# Patient Record
Sex: Female | Born: 1965
Health system: Southern US, Community
[De-identification: ages and names within clinical notes are randomized; demographics above are authoritative.]

## PROBLEM LIST (undated history)

## (undated) DIAGNOSIS — D61818 Other pancytopenia: Secondary | ICD-10-CM

## (undated) DIAGNOSIS — R5383 Other fatigue: Secondary | ICD-10-CM

## (undated) DIAGNOSIS — Z923 Personal history of irradiation: Secondary | ICD-10-CM

## (undated) DIAGNOSIS — N1330 Unspecified hydronephrosis: Secondary | ICD-10-CM

## (undated) DIAGNOSIS — C539 Malignant neoplasm of cervix uteri, unspecified: Secondary | ICD-10-CM

## (undated) DIAGNOSIS — C801 Malignant (primary) neoplasm, unspecified: Secondary | ICD-10-CM

## (undated) DIAGNOSIS — S22080A Wedge compression fracture of T11-T12 vertebra, initial encounter for closed fracture: Secondary | ICD-10-CM

## (undated) DIAGNOSIS — Z9221 Personal history of antineoplastic chemotherapy: Secondary | ICD-10-CM

## (undated) HISTORY — PX: TUBAL LIGATION: SHX77

---
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
## 1987-08-06 HISTORY — PX: OTHER SURGICAL HISTORY: SHX169

## 2018-09-01 ENCOUNTER — Encounter: Payer: Self-pay | Admitting: Emergency Medicine

## 2018-09-01 ENCOUNTER — Emergency Department
Admission: EM | Admit: 2018-09-01 | Discharge: 2018-09-01 | Disposition: A | Discharge status: Home / Self Care | Payer: BLUE CROSS/BLUE SHIELD | Attending: Emergency Medicine | Admitting: Emergency Medicine

## 2018-09-01 ENCOUNTER — Emergency Department: Payer: BLUE CROSS/BLUE SHIELD

## 2018-09-01 DIAGNOSIS — K5900 Constipation, unspecified: Secondary | ICD-10-CM | POA: Insufficient documentation

## 2018-09-01 DIAGNOSIS — R1084 Generalized abdominal pain: Secondary | ICD-10-CM

## 2018-09-01 DIAGNOSIS — F17210 Nicotine dependence, cigarettes, uncomplicated: Secondary | ICD-10-CM | POA: Diagnosis not present

## 2018-09-01 DIAGNOSIS — R109 Unspecified abdominal pain: Secondary | ICD-10-CM | POA: Diagnosis present

## 2018-09-01 LAB — URINALYSIS, COMPLETE (UACMP) WITH MICROSCOPIC
Bacteria, UA: NONE SEEN
Bilirubin Urine: NEGATIVE
Glucose, UA: NEGATIVE mg/dL
KETONES UR: NEGATIVE mg/dL
LEUKOCYTES UA: NEGATIVE
Nitrite: NEGATIVE
Protein, ur: NEGATIVE mg/dL
Specific Gravity, Urine: 1.003 — ABNORMAL LOW (ref 1.005–1.030)
Squamous Epithelial / HPF: NONE SEEN (ref 0–5)
pH: 7 (ref 5.0–8.0)

## 2018-09-01 LAB — COMPREHENSIVE METABOLIC PANEL
ALK PHOS: 74 U/L (ref 38–126)
ALT: 13 U/L (ref 0–44)
AST: 20 U/L (ref 15–41)
Albumin: 4.4 g/dL (ref 3.5–5.0)
Anion gap: 8 (ref 5–15)
BUN: 10 mg/dL (ref 6–20)
CALCIUM: 9.2 mg/dL (ref 8.9–10.3)
CO2: 26 mmol/L (ref 22–32)
Chloride: 101 mmol/L (ref 98–111)
Creatinine, Ser: 0.75 mg/dL (ref 0.44–1.00)
GFR calc Af Amer: >60 mL/min (ref >60)
GFR calc non Af Amer: >60 mL/min (ref >60)
Glucose, Bld: 113 mg/dL — ABNORMAL HIGH (ref 70–99)
Potassium: 3.4 mmol/L — ABNORMAL LOW (ref 3.5–5.1)
Sodium: 135 mmol/L (ref 135–145)
Total Bilirubin: 0.5 mg/dL (ref 0.3–1.2)
Total Protein: 7.8 g/dL (ref 6.5–8.1)

## 2018-09-01 LAB — CBC
HEMATOCRIT: 44.2 % (ref 36.0–46.0)
Hemoglobin: 14.8 g/dL (ref 12.0–15.0)
MCH: 29.2 pg (ref 26.0–34.0)
MCHC: 33.5 g/dL (ref 30.0–36.0)
MCV: 87.2 fL (ref 80.0–100.0)
Platelets: 269 10*3/uL (ref 150–400)
RBC: 5.07 MIL/uL (ref 3.87–5.11)
RDW: 12.2 % (ref 11.5–15.5)
WBC: 6.5 10*3/uL (ref 4.0–10.5)
nRBC: 0 % (ref 0.0–0.2)

## 2018-09-01 LAB — LIPASE, BLOOD: Lipase: 20 U/L (ref 11–51)

## 2018-09-01 MED ORDER — DOCUSATE SODIUM 100 MG PO CAPS: 100.0000 mg | ORAL_CAPSULE | Freq: Two times a day (BID) | ORAL | 2 refills | Status: DC

## 2018-09-01 MED ORDER — CYCLOBENZAPRINE HCL 10 MG PO TABS: 10.0000 mg | ORAL_TABLET | Freq: Three times a day (TID) | ORAL | 0 refills | Status: DC | PRN

## 2018-09-01 MED ORDER — SENNA 8.6 MG PO TABS: 1.0000 | ORAL_TABLET | Freq: Every day | ORAL | 0 refills | Status: DC

## 2018-09-01 MED ORDER — SODIUM CHLORIDE 0.9% FLUSH: 3.0000 mL | Freq: Once | INTRAVENOUS | Status: DC

## 2018-09-01 NOTE — ED Provider Notes (Signed)
[facility] Emergency Department Provider Note       Time seen: ----------------------------------------- 1:55 PM on 09/01/2018 -----------------------------------------   I have reviewed the triage vital signs and the nursing notes.  HISTORY   Chief Complaint Abdominal Pain    HPI Jennifer Santos is a 53 y.o. female with no significant past medical history who presents to the ED for abdominal pain.  Patient is concerned she has a bowel blockage because for the past 6 months she occasionally has to disimpact herself.  She came in today due to increased pain.  She is also concerned she may have a UTI.  History reviewed. No pertinent past medical history.  There are no active problems to display for this patient.   Past Surgical History:  Procedure Laterality Date  . TUBAL LIGATION      Allergies Patient has no known allergies.  Social History Social History   Tobacco Use  . Smoking status: Current Every Day Smoker    Packs/day: 0.50    Types: Cigarettes  . Smokeless tobacco: Never Used  Substance Use Topics  . Alcohol use: Yes    Comment: Social   . Drug use: Yes    Types: Marijuana, Cocaine   Review of Systems Constitutional: Negative for fever. Cardiovascular: Negative for chest pain. Respiratory: Negative for shortness of breath. Gastrointestinal: Positive for abdominal pain, constipation Musculoskeletal: Negative for back pain. Skin: Negative for rash. Neurological: Negative for headaches, focal weakness or numbness.  All systems negative/normal/unremarkable except as stated in the HPI  ____________________________________________   PHYSICAL EXAM:  VITAL SIGNS: ED Triage Vitals [09/01/18 1147]  Enc Vitals Group     BP (!) 172/116     Pulse Rate 83     Resp 18     Temp 98.4 F (36.9 C)     Temp Source Oral     SpO2 97 %     Weight 120 lb (54.4 kg)     Height 5\' 6"  (1.676 m)     Head Circumference      Peak Flow    Pain Score      Pain Loc      Pain Edu?      Excl. in [place]?    Constitutional: Alert and oriented. Well appearing and in no distress. Eyes: Conjunctivae are normal. Normal extraocular movements. Cardiovascular: Normal rate, regular rhythm. No murmurs, rubs, or gallops. Respiratory: Normal respiratory effort without tachypnea nor retractions. Breath sounds are clear and equal bilaterally. No wheezes/rales/rhonchi. Gastrointestinal: Soft and nontender. Normal bowel sounds Musculoskeletal: Nontender with normal range of motion in extremities. No lower extremity tenderness nor edema. Neurologic:  Normal speech and language. No gross focal neurologic deficits are appreciated.  Skin:  Skin is warm, dry and intact. No rash noted. Psychiatric: Mood and affect are normal. Speech and behavior are normal.  ____________________________________________  ED COURSE:  As part of my medical decision making, I reviewed the following data within the [place] History obtained from family if available, nursing notes, old chart and ekg, as well as notes from prior ED visits. Patient presented for abd pain, we will assess with labs and imaging as indicated at this time.   Procedures ____________________________________________   LABS (pertinent positives/negatives)  Labs Reviewed  COMPREHENSIVE METABOLIC PANEL - Abnormal; Notable for the following components:      Result Value   Potassium 3.4 (*)    Glucose, Bld 113 (*)    All other components within normal limits  URINALYSIS, COMPLETE (UACMP) WITH MICROSCOPIC - Abnormal; Notable for the following components:   Color, Urine STRAW (*)    APPearance CLEAR (*)    Specific Gravity, Urine 1.003 (*)    Hgb urine dipstick MODERATE (*)    All other components within normal limits  LIPASE, BLOOD  CBC    RADIOLOGY  Abdomen 2 views unremarkable  ____________________________________________   DIFFERENTIAL DIAGNOSIS   Constipation, gas  pain, chronic pain, fictitious disorder  FINAL ASSESSMENT AND PLAN  Constipation   Plan: The patient had presented for concerns regarding a bowel obstruction.  Her labs and imaging were unremarkable.  I will discharge her with Colace and Senokot and refer her to GI for outpatient follow-up.   Laurence Aly, MD    Note: This note was generated in part or whole with voice recognition software. Voice recognition is usually quite accurate but there are transcription errors that can and very often do occur. I apologize for any typographical errors that were not detected and corrected.     Earleen Newport, MD 09/01/18 1447

## 2018-09-01 NOTE — ED Triage Notes (Signed)
Patient presents to ED via POV from home. Patient states "I have a bowel blockage and a lot of pain". Patient denies being dx with blockage or history of same. Patient states "whatever needs to come out I have to go get it and pull it out manually". Patient reports this has been an ongoing issue for 6 months but came in today due to increased pain.

## 2018-09-01 NOTE — ED Triage Notes (Signed)
Short of breath and thinks she may have uti.  amulated in with walker.  Placed in wheelchair.  Says hx of chf.

## 2018-10-20 ENCOUNTER — Ambulatory Visit
Admission: RE | Admit: 2018-10-20 | Discharge: 2018-10-20 | Disposition: A | Discharge status: Home / Self Care | Payer: BLUE CROSS/BLUE SHIELD | Source: Ambulatory Visit | Attending: Family Medicine | Admitting: Family Medicine

## 2018-10-20 ENCOUNTER — Other Ambulatory Visit: Payer: Self-pay | Admitting: Family Medicine

## 2018-10-20 DIAGNOSIS — M545 Low back pain, unspecified: Secondary | ICD-10-CM

## 2018-10-28 ENCOUNTER — Telehealth: Payer: Self-pay | Admitting: Obstetrics & Gynecology

## 2018-10-28 NOTE — Telephone Encounter (Signed)
[employer] referring for other specified abnormal uterine and vaginal bleeding . Voicemail box is full unable to let message

## 2018-10-29 NOTE — Telephone Encounter (Signed)
Patient is schedule 11/04/18 pending review of provider

## 2018-11-04 ENCOUNTER — Other Ambulatory Visit: Payer: Self-pay

## 2018-11-04 ENCOUNTER — Encounter: Payer: Self-pay | Admitting: Obstetrics & Gynecology

## 2018-11-04 ENCOUNTER — Other Ambulatory Visit (HOSPITAL_COMMUNITY)
Admission: RE | Admit: 2018-11-04 | Discharge: 2018-11-04 | Disposition: A | Discharge status: Home / Self Care | Payer: BLUE CROSS/BLUE SHIELD | Source: Ambulatory Visit | Attending: Obstetrics & Gynecology | Admitting: Obstetrics & Gynecology

## 2018-11-04 ENCOUNTER — Ambulatory Visit (INDEPENDENT_AMBULATORY_CARE_PROVIDER_SITE_OTHER): Payer: BLUE CROSS/BLUE SHIELD | Admitting: Obstetrics & Gynecology

## 2018-11-04 VITALS — BP 130/80 | Ht 66.0 in | Wt 121.0 lb

## 2018-11-04 DIAGNOSIS — C541 Malignant neoplasm of endometrium: Secondary | ICD-10-CM

## 2018-11-04 DIAGNOSIS — N950 Postmenopausal bleeding: Secondary | ICD-10-CM | POA: Insufficient documentation

## 2018-11-04 DIAGNOSIS — Z124 Encounter for screening for malignant neoplasm of cervix: Secondary | ICD-10-CM | POA: Insufficient documentation

## 2018-11-04 NOTE — Progress Notes (Signed)
Postmenopausal Bleeding Patient complains of vaginal bleeding. She has been menopausal for 9 years. Currently on no HRT. Bleeding is described as occuring over the last one year, weekly at first but now almost daily for the last 2 months; clots at times recently; no pain.  Noticed a 10-12 lb weight loss over the last 6 mos.  As worsening constipation as well as less feeling of bowel movements when she defacates.  Uses Miralax frequently. Other menopausal symptoms include: none. Workup to date: none.  Menstrual History: OB History    Gravida  3   Para  2   Term  2   Preterm      AB  1   Living  2     SAB  1   TAB      Ectopic      Multiple      Live Births              PMHx: She  has no past medical history on file. Also,  has a past surgical history that includes Tubal ligation., family history includes Brain cancer in her father; Cancer in her father; Lung cancer in her father.,  reports that she has been smoking cigarettes. She has been smoking about 0.50 packs per day. She has never used smokeless tobacco. She reports current alcohol use. She reports current drug use. Drugs: Marijuana and Cocaine.  She has a current medication list which includes the following prescription(s): methylprednisolone, tizanidine, cyclobenzaprine, docusate sodium, and senna. Also, has No Known Allergies.  Review of Systems  Constitutional: Negative for chills, fever and malaise/fatigue.  HENT: Negative for congestion, sinus pain and sore throat.   Eyes: Negative for blurred vision and pain.  Respiratory: Negative for cough and wheezing.   Cardiovascular: Negative for chest pain and leg swelling.  Gastrointestinal: Negative for abdominal pain, constipation, diarrhea, heartburn, nausea and vomiting.  Genitourinary: Negative for dysuria, frequency, hematuria and urgency.  Musculoskeletal: Negative for back pain, joint pain, myalgias and neck pain.  Skin: Negative for itching and rash.   Neurological: Negative for dizziness, tremors and weakness.  Endo/Heme/Allergies: Does not bruise/bleed easily.  Psychiatric/Behavioral: Negative for depression. The patient is not nervous/anxious and does not have insomnia.     Objective: BP 130/80   Ht 5\' 6"  (1.676 m)   Wt 121 lb (54.9 kg)   BMI 19.53 kg/m  Physical Exam Constitutional:      General: She is not in acute distress.    Appearance: She is well-developed.  Genitourinary:     Pelvic exam was performed with patient supine.     Vulva, inguinal canal, urethra, bladder, vagina, uterus and rectum normal.     No lesions in the vagina.     No vaginal bleeding.     Cervical friability and bleeding present.     No cervical motion tenderness, lesion or polyp.     Uterus is mobile.     Uterus is not enlarged.     No uterine mass detected.    Uterus is midaxial.     No right or left adnexal mass present.     Right adnexa not tender.     Left adnexa not tender.     Genitourinary Comments: Concerning appearance of cervix   HENT:     Head: Normocephalic and atraumatic. No laceration.     Right Ear: Hearing normal.     Left Ear: Hearing normal.     Mouth/Throat:     Pharynx: Uvula  midline.  Eyes:     Pupils: Pupils are equal, round, and reactive to light.  Neck:     Musculoskeletal: Normal range of motion and neck supple.     Thyroid: No thyromegaly.  Cardiovascular:     Rate and Rhythm: Normal rate and regular rhythm.     Heart sounds: No murmur. No friction rub. No gallop.   Pulmonary:     Effort: Pulmonary effort is normal. No respiratory distress.     Breath sounds: Normal breath sounds. No wheezing.  Abdominal:     General: Bowel sounds are normal. There is no distension.     Palpations: Abdomen is soft.     Tenderness: There is no abdominal tenderness. There is no rebound.  Musculoskeletal: Normal range of motion.  Neurological:     Mental Status: She is alert and oriented to person, place, and time.      Cranial Nerves: No cranial nerve deficit.  Skin:    General: Skin is warm and dry.  Psychiatric:        Judgment: Judgment normal.  Vitals signs reviewed.   ASSESSMENT/PLAN:     Post-menopausal bleeding    -  Primary   Relevant Orders   Surgical pathology   Cytology - PAP   Screening for cervical cancer       Relevant Orders   Cytology - PAP    Concern for cervical cancer Gyn Onc referral after results if pos for cancer Colpo and [place] if neg initial Bx/PAP Counseled as to the risks and treatment options for cervical and/or endometrial cancer   Endometrial Biopsy After discussion with the patient regarding her abnormal uterine bleeding I recommended that she proceed with an endometrial biopsy for further diagnosis. The risks, benefits, alternatives, and indications for an endometrial biopsy were discussed with the patient in detail. She understood the risks including infection, bleeding, cervical laceration and uterine perforation.  Verbal consent was obtained.   PROCEDURE NOTE:  Pipelle endometrial biopsy was performed using aseptic technique with iodine preparation.  The uterus was sounded to a length of 6 cm.  Adequate sampling was obtained with minimal blood loss.  The patient tolerated the procedure well.  Disposition will be pending pathology.  Barnett Applebaum, MD, [facility], [facility] 11/04/2018  4:29 PM

## 2018-11-04 NOTE — Patient Instructions (Signed)

## 2018-11-09 ENCOUNTER — Other Ambulatory Visit: Payer: Self-pay | Admitting: Obstetrics & Gynecology

## 2018-11-09 DIAGNOSIS — N950 Postmenopausal bleeding: Secondary | ICD-10-CM

## 2018-11-09 DIAGNOSIS — C539 Malignant neoplasm of cervix uteri, unspecified: Secondary | ICD-10-CM

## 2018-11-09 LAB — CYTOLOGY - PAP

## 2018-11-09 NOTE — Progress Notes (Signed)
Please schedule appt w Gyn Onc, due to Dx of Cervical Cancer by PAP and Biopsy.  Pt is aware, and awaiting scheduling.

## 2018-11-10 ENCOUNTER — Telehealth: Payer: Self-pay | Admitting: Oncology

## 2018-11-10 ENCOUNTER — Telehealth: Payer: Self-pay

## 2018-11-10 NOTE — Telephone Encounter (Signed)
Referral was received and reviewed from Dr. Kenton Kingfisher for new diagnosis of cervical cancer. Due to COVID-19 pandemic, we do not have gyn onc providers in person, in clinic. Options are to arrange with our providers at [facility] or at [facility]. Jennifer Santos prefers to be seen in [place]. I am working with gyn onc navigator, Jennifer Santos, to arrange appointment with Dr. Alycia Santos for tomorrow. Jennifer Santos is aware that we will be calling her with appointment details.

## 2018-11-10 NOTE — Telephone Encounter (Signed)
Called patient with appointment to see Dr. Alycia Rossetti at 8:30 with arrival at 8 am.  Gave her the address to the [place] and discuss the no visitor policy for [id number].

## 2018-11-11 ENCOUNTER — Encounter: Payer: Self-pay | Admitting: Oncology

## 2018-11-11 ENCOUNTER — Inpatient Hospital Stay: Payer: BLUE CROSS/BLUE SHIELD | Attending: Gynecologic Oncology | Admitting: Gynecologic Oncology

## 2018-11-11 ENCOUNTER — Other Ambulatory Visit: Payer: Self-pay | Admitting: Oncology

## 2018-11-11 ENCOUNTER — Other Ambulatory Visit: Payer: Self-pay

## 2018-11-11 ENCOUNTER — Telehealth: Payer: Self-pay | Admitting: Oncology

## 2018-11-11 DIAGNOSIS — C531 Malignant neoplasm of exocervix: Secondary | ICD-10-CM

## 2018-11-11 DIAGNOSIS — F1290 Cannabis use, unspecified, uncomplicated: Secondary | ICD-10-CM | POA: Insufficient documentation

## 2018-11-11 DIAGNOSIS — F17210 Nicotine dependence, cigarettes, uncomplicated: Secondary | ICD-10-CM | POA: Diagnosis not present

## 2018-11-11 DIAGNOSIS — F1490 Cocaine use, unspecified, uncomplicated: Secondary | ICD-10-CM | POA: Insufficient documentation

## 2018-11-11 DIAGNOSIS — R222 Localized swelling, mass and lump, trunk: Secondary | ICD-10-CM | POA: Diagnosis not present

## 2018-11-11 DIAGNOSIS — I10 Essential (primary) hypertension: Secondary | ICD-10-CM | POA: Diagnosis not present

## 2018-11-11 DIAGNOSIS — C539 Malignant neoplasm of cervix uteri, unspecified: Secondary | ICD-10-CM | POA: Insufficient documentation

## 2018-11-11 DIAGNOSIS — R630 Anorexia: Secondary | ICD-10-CM | POA: Diagnosis not present

## 2018-11-11 NOTE — Patient Instructions (Addendum)
Plan to have a PET scan to evaluate for spread of the cancer.  We will also make a referral for you to meet with Dr. Gery Pray in Radiation to discuss and start radiation and Dr. Heath Lark in Medical Oncology to discuss and initiate chemotherapy.

## 2018-11-11 NOTE — Progress Notes (Signed)
Consult Note: Gyn-Onc Patient is seen today in consultation at the request of Dr. Teresa Coombs.  Jennifer Santos 53 y.o. female  CC:  Chief Complaint  Patient presents with  . Malignant neoplasm of exocervix (HCC)    HPI: Patient is a 53 year old gravida 3 para 2 who for the last 9 months to year has been having postmenopausal bleeding.  Initially it started just as light bleeding every week and then increased fairly significantly.  About 8 weeks ago she passed some very heavy clots.  She is not sexually active.  It was because of the increase in bleeding that she sought medical attention.  She saw Dr. Kenton Kingfisher on April 1 and a Pap smear was performed that revealed malignant cells.  A biopsy was performed that also revealed invasive moderately differentiated squamous cell carcinoma.  And she was referred to [place] today.  She states her last Pap smear was in 2010.  At that time her Pap smear was abnormal.  She does not know how abnormal but she never followed up with her providers.  Other than the bleeding she does have back pain and she also is needing to take a laxative as she feels that there is "something in the way" when she has to have a bowel movement.  That is been going on for about 6 months.  She is able to empty her bladder without any difficulty.  She does have some lower abdominal cramping.  She does have some low back pain.  She has felt a knot in her back for about a year.  She states she recently saw neurosurgery as she has a fractured vertebra.  She states she had no pain along her vertebral body.  That was not done within our current medical record system. She does use cocaine about once a week.  Uses marijuana daily as it helps her feel better and it helps with her back pain.  She drinks alcohol about 2 times a week.  She smokes every day.  She is been smoking for 9 years and smokes a pack per day.  Review of Systems: Constitutional: She has lost about 10 to 12 pounds in the last 3  months.  This is been unintentional.  She does have diminished appetite Skin: + Lumps on her back Cardiovascular: No chest pain, shortness of breath, or edema  Pulmonary: No cough  Gastro Intestinal: Reporting intermittent lower abdominal soreness.  No nausea, vomiting, + constipation, or diarrhea reported.   Genitourinary: No frequency, urgency, or dysuria.  + vaginal bleeding  Musculoskeletal:+ Low back pain Neurologic: No weakness, numbness, or change in gait.   Current Meds:  Outpatient Encounter Medications as of 11/11/2018  Medication Sig  . ibuprofen (ADVIL,MOTRIN) 200 MG tablet Take by mouth.  Marland Kitchen tiZANidine (ZANAFLEX) 2 MG tablet   . [DISCONTINUED] cyclobenzaprine (FLEXERIL) 10 MG tablet Take 1 tablet (10 mg total) by mouth 3 (three) times daily as needed for muscle spasms. (Patient not taking: Reported on 11/04/2018)  . [DISCONTINUED] docusate sodium (COLACE) 100 MG capsule Take 1 capsule (100 mg total) by mouth 2 (two) times daily. (Patient not taking: Reported on 11/04/2018)  . [DISCONTINUED] methylPREDNISolone (MEDROL DOSEPAK) 4 MG TBPK tablet   . [DISCONTINUED] senna (SENOKOT) 8.6 MG TABS tablet Take 1 tablet (8.6 mg total) by mouth at bedtime. (Patient not taking: Reported on 11/04/2018)   No facility-administered encounter medications on file as of 11/11/2018.     Allergy: No Known Allergies  Social Hx:   Social  History   Socioeconomic History  . Marital status: Legally Separated    Spouse name: Not on file  . Number of children: Not on file  . Years of education: Not on file  . Highest education level: Not on file  Occupational History  . Not on file  Social Needs  . Financial resource strain: Not on file  . Food insecurity:    Worry: Not on file    Inability: Not on file  . Transportation needs:    Medical: Not on file    Non-medical: Not on file  Tobacco Use  . Smoking status: Current Every Day Smoker    Packs/day: 0.50    Types: Cigarettes  . Smokeless tobacco:  Never Used  Substance and Sexual Activity  . Alcohol use: Yes    Comment: Social   . Drug use: Yes    Types: Marijuana, Cocaine  . Sexual activity: Not Currently  Lifestyle  . Physical activity:    Days per week: Not on file    Minutes per session: Not on file  . Stress: Not on file  Relationships  . Social connections:    Talks on phone: Not on file    Gets together: Not on file    Attends religious service: Not on file    Active member of club or organization: Not on file    Attends meetings of clubs or organizations: Not on file    Relationship status: Not on file  . Intimate partner violence:    Fear of current or ex partner: Not on file    Emotionally abused: Not on file    Physically abused: Not on file    Forced sexual activity: Not on file  Other Topics Concern  . Not on file  Social History Narrative  . Not on file    Past Surgical Hx:  Past Surgical History:  Procedure Laterality Date  . BACK SURGERY  1985   Removal of superficial mass  . TUBAL LIGATION      Past Medical Hx: History reviewed. No pertinent past medical history.  Oncology Hx:    Cervical ca ([place])   11/09/2018 Initial Diagnosis    Cervical ca [facility])     Family Hx:  Family History  Problem Relation Age of Onset  . Cancer Father   . Brain cancer Father   . Lung cancer Father     Vitals:  Blood pressure (!) 153/92, pulse 80, temperature 98.6 F (37 C), temperature source Oral, resp. rate 16, height 5\' 6"  (1.676 m), weight 123 lb (55.8 kg), SpO2 100 %.  Physical Exam: Well-nourished well-developed thin female in no acute distress.  Neck: Supple, no lymphadenopathy, no thyromegaly.  Abdomen: Soft, nontender, nondistended.  No palpable mass hepatosplenomegaly.  Groins: Shotty lymphadenopathy in the right groin.  Back: Nontender.  Well-healed surgical incision over the lower mid back.  Lipoma palpable on her right side  Extremities: No edema  Vulva: External genitalia within  normal limits.  The vagina is well but the lysed.  There is a large crater-like mass at the top of the vaginal apex.  There is no normal cervix identifiable.  On bimanual examination there is sidewall involvement of an 8 cm mass on the left side.  The right sidewall is free but there is parametrial involvement on the right side but it does not extend to the sidewall.  Rectal confirms  Assessment/Plan: 53 year old with a stage IIIb clinical squamous cell carcinoma of the cervix.  I  discussed with the patient that this is a treatable disease but we have several steps we need to proceed with.  -We will get her scheduled for a PET/CT.  She knows that I will contact her with results. -Referrals have been placed for both medical oncology for weekly cisplatin as well as radiation oncology.  The process was discussed with the patient. -I discussed with the patient that marijuana is not a issue but we would like her to stop using cocaine and the rationale behind this with regards to hypertension as well as vascular constriction.  She states that that makes sense and she will work towards that.  She states that should not be a problem for her.  Her questions were elicited and answered to her satisfaction.  We appreciate the opportunity to partner in the care of this very pleasant patient.  I do understand that this is an overwhelming situation.  She did not have very many questions for [place] today but encouraged her to call us when she has the opportunity to process this formation.  Montrell Cessna A., MD 11/11/2018, 9:37 AM

## 2018-11-11 NOTE — Telephone Encounter (Signed)
Left a message for Jennifer Santos with appointments to see Dr. Alvy Bimler on 11/18/18 at 10 am and chemo education at 12 pm.

## 2018-11-11 NOTE — Addendum Note (Signed)
Addended by: Jacqulyn Liner on: 11/11/2018 09:27 AM   Modules accepted: Orders

## 2018-11-12 NOTE — Progress Notes (Signed)
GYN Location of Tumor / Histology:   Malignant neoplasm of exocervix ([place])     Jennifer Santos presented with symptoms of: for the last 9 months to year has been having postmenopausal bleeding.  Initially it started just as light bleeding every week and then increased fairly significantly.  About 8 weeks ago she passed some very heavy clots.  She is not sexually active.  It was because of the increase in bleeding that she sought medical attention.  She saw Dr. Kenton Kingfisher on April 1 and a Pap smear was performed that revealed malignant cells.  A biopsy was performed that also revealed invasive moderately differentiated squamous cell carcinoma.  And she was referred to [place] today.  She states her last Pap smear was in 2010.  At that time her Pap smear was abnormal.  She does not know how abnormal but she never followed up with her providers.  Other than the bleeding she does have back pain and she also is needing to take a laxative as she feels that there is "something in the way" when she has to have a bowel movement.  That is been going on for about 6 months.  She is able to empty her bladder without any difficulty.  She does have some lower abdominal cramping.  She does have some low back pain.  She has felt a knot in her back for about a year.  Biopsies revealed: 11/04/18:  Diagnosis Endometrium, biopsy - INVASIVE MODERATELY DIFFERENTIATED SQUAMOUS CELL CARCINOMA. SEE NOTE Diagnosis Note Squamous cell carcinoma arises in a background of high-grade dysplasia with possible glandular involvement.  Cytology - PAP 11/04/18:  Diagnosis MALIGNANT CELLS, CONSISTENT WITH CARCINOMA  Past/Anticipated interventions by Gyn/Onc surgery, if any: Per Dr. Alycia Rossetti 11/11/18:  Assessment/Plan: 53 year old with a stage IIIb clinical squamous cell carcinoma of the cervix.  I discussed with the patient that this is a treatable disease but we have several steps we need to proceed with.  -We will get her scheduled for a PET/CT.  She  knows that I will contact her with results. -Referrals have been placed for both medical oncology for weekly cisplatin as well as radiation oncology.  The process was discussed with the patient. -I discussed with the patient that marijuana is not a issue but we would like her to stop using cocaine and the rationale behind this with regards to hypertension as well as vascular constriction.  She states that that makes sense and she will work towards that.  She states that should not be a problem for her.   PET Scan 11/17/18:  Rescheduled to 11/20/18  Past/Anticipated interventions by medical oncology, if any: Initial consult with Dr. Alvy Bimler 11/18/18 1000  Weight changes, if any: pt has lost approximately 12 lbs in 3 months  Bowel/Bladder complaints, if any: no bladder c/o. Pt has constipation and has to take laxative to produce BM. At times, needs digital removal. Dysuria/hematuria/frequency: denies Vaginal bleeding/discharge: pt reports vaginal bleeding. Rectal bleeding/diarrhea/constipation: rectal bleeding with digital removal of stool.  Nausea/Vomiting, if any: No  Pain issues, if any:  Pt c/o daily pain, described as cramping in pelvic area and lower back.  SAFETY ISSUES:  Prior radiation? No  Pacemaker/ICD? No  Possible current pregnancy? No  Is the patient on methotrexate? No  Current Complaints / other details:  Pt presents today for initial consult with Dr. Sondra Come for Radiation Oncology. Pt is unaccompanied.   BP (!) 142/95 (BP Location: Left Arm)   Pulse 80   Temp  98.3 F (36.8 C) (Oral)   Resp 20   Ht 5\' 6"  (1.676 m)   Wt 125 lb 6.4 oz (56.9 kg)   SpO2 100%   BMI 20.24 kg/m   Wt Readings from Last 3 Encounters:  11/18/18 125 lb 6.4 oz (56.9 kg)  11/11/18 123 lb (55.8 kg)  11/04/18 121 lb (54.9 kg)   Loma Sousa, RN BSN

## 2018-11-17 ENCOUNTER — Other Ambulatory Visit: Payer: Self-pay

## 2018-11-17 ENCOUNTER — Ambulatory Visit (HOSPITAL_COMMUNITY)
Admission: RE | Admit: 2018-11-17 | Discharge: 2018-11-17 | Disposition: A | Discharge status: Home / Self Care | Payer: BLUE CROSS/BLUE SHIELD | Source: Ambulatory Visit | Attending: Gynecologic Oncology | Admitting: Gynecologic Oncology

## 2018-11-17 ENCOUNTER — Ambulatory Visit (HOSPITAL_COMMUNITY): Payer: BLUE CROSS/BLUE SHIELD

## 2018-11-17 ENCOUNTER — Encounter (HOSPITAL_COMMUNITY): Payer: Self-pay

## 2018-11-17 DIAGNOSIS — C531 Malignant neoplasm of exocervix: Secondary | ICD-10-CM

## 2018-11-17 HISTORY — DX: Malignant (primary) neoplasm, unspecified: C80.1

## 2018-11-17 MED ORDER — SODIUM CHLORIDE (PF) 0.9 % IJ SOLN
INTRAMUSCULAR | Status: AC
  Filled 2018-11-17: qty 50

## 2018-11-17 MED ORDER — IOHEXOL 300 MG/ML  SOLN
100.0000 mL | Freq: Once | INTRAMUSCULAR | Status: AC | PRN
  Administered 2018-11-17: 75 mL via INTRAVENOUS

## 2018-11-18 ENCOUNTER — Ambulatory Visit
Admission: RE | Admit: 2018-11-18 | Discharge: 2018-11-18 | Disposition: A | Discharge status: Home / Self Care | Payer: BLUE CROSS/BLUE SHIELD | Source: Ambulatory Visit | Attending: Radiation Oncology | Admitting: Radiation Oncology

## 2018-11-18 ENCOUNTER — Encounter: Payer: Self-pay | Admitting: Radiation Oncology

## 2018-11-18 ENCOUNTER — Inpatient Hospital Stay (HOSPITAL_BASED_OUTPATIENT_CLINIC_OR_DEPARTMENT_OTHER): Payer: BLUE CROSS/BLUE SHIELD | Admitting: Hematology and Oncology

## 2018-11-18 ENCOUNTER — Inpatient Hospital Stay: Payer: BLUE CROSS/BLUE SHIELD

## 2018-11-18 ENCOUNTER — Other Ambulatory Visit: Payer: Self-pay

## 2018-11-18 ENCOUNTER — Encounter: Payer: Self-pay | Admitting: Oncology

## 2018-11-18 ENCOUNTER — Encounter: Payer: Self-pay | Admitting: Hematology and Oncology

## 2018-11-18 VITALS — BP 136/93 | HR 66 | Temp 98.3°F | Resp 18 | Ht 66.0 in | Wt 128.4 lb

## 2018-11-18 VITALS — BP 142/95 | HR 80 | Temp 98.3°F | Resp 20 | Ht 66.0 in | Wt 125.4 lb

## 2018-11-18 DIAGNOSIS — C531 Malignant neoplasm of exocervix: Secondary | ICD-10-CM

## 2018-11-18 DIAGNOSIS — G8929 Other chronic pain: Secondary | ICD-10-CM | POA: Diagnosis not present

## 2018-11-18 DIAGNOSIS — N1330 Unspecified hydronephrosis: Secondary | ICD-10-CM | POA: Diagnosis not present

## 2018-11-18 DIAGNOSIS — Z923 Personal history of irradiation: Secondary | ICD-10-CM | POA: Diagnosis not present

## 2018-11-18 DIAGNOSIS — M549 Dorsalgia, unspecified: Secondary | ICD-10-CM | POA: Diagnosis not present

## 2018-11-18 DIAGNOSIS — F17210 Nicotine dependence, cigarettes, uncomplicated: Secondary | ICD-10-CM

## 2018-11-18 DIAGNOSIS — K5909 Other constipation: Secondary | ICD-10-CM

## 2018-11-18 DIAGNOSIS — Z7189 Other specified counseling: Secondary | ICD-10-CM

## 2018-11-18 DIAGNOSIS — Z720 Tobacco use: Secondary | ICD-10-CM

## 2018-11-18 LAB — CMP (CANCER CENTER ONLY)
ALT: 13 U/L (ref 0–44)
AST: 17 U/L (ref 15–41)
Albumin: 4 g/dL (ref 3.5–5.0)
Alkaline Phosphatase: 81 U/L (ref 38–126)
Anion gap: 14 (ref 5–15)
BUN: 14 mg/dL (ref 6–20)
CO2: 24 mmol/L (ref 22–32)
Calcium: 9.3 mg/dL (ref 8.9–10.3)
Chloride: 101 mmol/L (ref 98–111)
Creatinine: 1 mg/dL (ref 0.44–1.00)
GFR, Est AFR Am: >60 mL/min (ref >60)
GFR, Estimated: >60 mL/min (ref >60)
Glucose, Bld: 92 mg/dL (ref 70–99)
Potassium: 4.5 mmol/L (ref 3.5–5.1)
Sodium: 139 mmol/L (ref 135–145)
Total Bilirubin: <0.2 mg/dL — ABNORMAL LOW (ref 0.3–1.2)
Total Protein: 7.6 g/dL (ref 6.5–8.1)

## 2018-11-18 LAB — CBC WITH DIFFERENTIAL/PLATELET
Abs Immature Granulocytes: 0.03 10*3/uL (ref 0.00–0.07)
Basophils Absolute: 0 10*3/uL (ref 0.0–0.1)
Basophils Relative: 0 %
Eosinophils Absolute: 0.1 10*3/uL (ref 0.0–0.5)
Eosinophils Relative: 1 %
HCT: 40.8 % (ref 36.0–46.0)
Hemoglobin: 13.4 g/dL (ref 12.0–15.0)
Immature Granulocytes: 0 %
Lymphocytes Relative: 18 %
Lymphs Abs: 1.3 10*3/uL (ref 0.7–4.0)
MCH: 29.5 pg (ref 26.0–34.0)
MCHC: 32.8 g/dL (ref 30.0–36.0)
MCV: 89.9 fL (ref 80.0–100.0)
Monocytes Absolute: 0.7 10*3/uL (ref 0.1–1.0)
Monocytes Relative: 9 %
Neutro Abs: 5.5 10*3/uL (ref 1.7–7.7)
Neutrophils Relative %: 72 %
Platelets: 264 10*3/uL (ref 150–400)
RBC: 4.54 MIL/uL (ref 3.87–5.11)
RDW: 12.2 % (ref 11.5–15.5)
WBC: 7.6 10*3/uL (ref 4.0–10.5)
nRBC: 0 % (ref 0.0–0.2)

## 2018-11-18 LAB — MAGNESIUM: Magnesium: 2.3 mg/dL (ref 1.7–2.4)

## 2018-11-18 MED ORDER — ONDANSETRON HCL 8 MG PO TABS: 8.0000 mg | ORAL_TABLET | Freq: Three times a day (TID) | ORAL | 1 refills | Status: DC | PRN

## 2018-11-18 MED ORDER — LIDOCAINE-PRILOCAINE 2.5-2.5 % EX CREA: TOPICAL_CREAM | CUTANEOUS | 3 refills | Status: DC

## 2018-11-18 MED ORDER — PROCHLORPERAZINE MALEATE 10 MG PO TABS: 10.0000 mg | ORAL_TABLET | Freq: Four times a day (QID) | ORAL | 1 refills | Status: DC | PRN

## 2018-11-18 NOTE — Progress Notes (Signed)
START OFF PATHWAY REGIMEN - Other Dx   [id number]:Cisplatin 40 mg/m2 IV D1 q7 Days + RT:   A cycle is every 7 days:     Cisplatin   **Always confirm dose/schedule in your pharmacy ordering system**  Patient Characteristics: Intent of Therapy: Curative Intent, Discussed with Patient

## 2018-11-18 NOTE — Patient Instructions (Signed)
Coronavirus (COVID-19) Are you at risk?  Are you at risk for the Coronavirus (COVID-19)?  To be considered HIGH RISK for Coronavirus (COVID-19), you have to meet the following criteria:  . Traveled to [place], [place], [place], [place] or [place]; or in the [place] to [place], [place], [place], or [place]; and have fever, cough, and shortness of breath within the last 2 weeks of travel OR . Been in close contact with a person diagnosed with COVID-19 within the last 2 weeks and have fever, cough, and shortness of breath . IF YOU DO NOT MEET THESE CRITERIA, YOU ARE CONSIDERED LOW RISK FOR COVID-19.  What to do if you are HIGH RISK for COVID-19?  . If you are having a medical emergency, call 911. . Seek medical care right away. Before you go to a doctor's office, urgent care or emergency department, call ahead and tell them about your recent travel, contact with someone diagnosed with COVID-19, and your symptoms. You should receive instructions from your physician's office regarding next steps of care.  . When you arrive at healthcare provider, tell the healthcare staff immediately you have returned from visiting [place], [place], [place], [place] or [place]; or traveled in the [place] to [place], [place], [place], or [place]; in the last two weeks or you have been in close contact with a person diagnosed with COVID-19 in the last 2 weeks.   . Tell the health care staff about your symptoms: fever, cough and shortness of breath. . After you have been seen by a medical provider, you will be either: o Tested for (COVID-19) and discharged home on quarantine except to seek medical care if symptoms worsen, and asked to  - Stay home and avoid contact with others until you get your results (4-5 days)  - Avoid travel on public transportation if possible (such as bus, train, or airplane) or o Sent to the Emergency Department by EMS for evaluation, COVID-19 testing, and possible  admission depending on your condition and test results.  What to do if you are LOW RISK for COVID-19?  Reduce your risk of any infection by using the same precautions used for avoiding the common cold or flu:  . Wash your hands often with soap and warm water for at least 20 seconds.  If soap and water are not readily available, use an alcohol-based hand sanitizer with at least 60% alcohol.  . If coughing or sneezing, cover your mouth and nose by coughing or sneezing into the elbow areas of your shirt or coat, into a tissue or into your sleeve (not your hands). . Avoid shaking hands with others and consider head nods or verbal greetings only. . Avoid touching your eyes, nose, or mouth with unwashed hands.  . Avoid close contact with people who are sick. . Avoid places or events with large numbers of people in one location, like concerts or sporting events. . Carefully consider travel plans you have or are making. . If you are planning any travel outside or inside the [place], visit the CDC's Travelers' Health webpage for the latest health notices. . If you have some symptoms but not all symptoms, continue to monitor at home and seek medical attention if your symptoms worsen. . If you are having a medical emergency, call 911.   ADDITIONAL HEALTHCARE OPTIONS FOR PATIENTS  [facility] / e-Visit: [email or web address]         [facility]: [phone]  [facility]: [phone]                   [facility]: [phone]   

## 2018-11-18 NOTE — Progress Notes (Signed)
Radiation Oncology         ([phone]) [phone] ________________________________  Initial Outpatient Consultation  Name: Jennifer Santos MRN: [id number]  Date: 11/18/2018  DOB: [birth year]  ZG:[id number], Connye Burkitt, MD  Nancy Marus, MD   REFERRING PHYSICIAN: Nancy Marus, MD  DIAGNOSIS: Stage IIIb Clinical Squamous Cell Carcinoma of the Cervix  The encounter diagnosis was Malignant neoplasm of exocervix ([place]).  HISTORY OF PRESENT ILLNESS::Jennifer Santos is a 53 y.o. female who  presented with postmenopausal bleeding. She reports it began approximately 9 months to a year ago as light bleeding every week. The bleeding increased, and she passed some very heavy clots about 9 weeks ago. It was at this point that she sought medical attention, and she subsequently met with Dr. Kenton Kingfisher on 11/04/2018. He performed Pap smear and biopsy in the office that day. Pap smear revealed malignant cells, and the endometrium biopsy confirmed invasive moderately differentiated squamous cell carcinoma.   She met with Dr. Alycia Rossetti on 11/11/2018. Per Dr. Elenora Gamma note, pelvic examination shows: external genitalia within normal limits; the vagina is well but supported but there was a large crater-like mass at the top of the vaginal apex; there is no normal cervix identifiable; on bimanual examination there is sidewall involvement of an 8 cm mass on the left side; the right sidewall is free but there is parametrial involvement on the right side but it does not extend to the sidewall, rectal confirms.  She underwent chest/abdomen/pelvis CT scans on 11/17/2018. These revealed: irregular enlargement of the cervix with extension of soft tissue from the cervix into the posterior presacral space along the side of the rectum, wall thickening in the upper vagina is compatible with tumor involvement, the endometrial cavity of the uterus is markedly expanded and fluid-filled consistent with obstruction, no definite involvement of the posterior bladder wall  evident by CT; moderate right hydroureteronephrosis with ureteral dilatation extending down to the right pelvic sidewall adjacent to the cervix; abnormal, amorphous soft tissue surrounds the distal aorta and common iliac arteries, imaging appearance concerning for tumor spread; no evidence for metastatic disease in the chest.  She is scheduled to meet with Dr. Alvy Bimler later this morning. She is also scheduled for PET scan on 11/20/2018.   PREVIOUS RADIATION THERAPY: No  PAST MEDICAL HISTORY:  has a past medical history of #[id number] (dx'd 10/20/18).    PAST SURGICAL HISTORY: Past Surgical History:  Procedure Laterality Date   BACK SURGERY  1985   Removal of superficial mass   IR IMAGING GUIDED PORT INSERTION  11/25/2018   TUBAL LIGATION      FAMILY HISTORY: family history includes Brain cancer in her father; Cancer in her father; Lung cancer in her father.  SOCIAL HISTORY:  reports that she has been smoking cigarettes. She has been smoking about 0.50 packs per day. She has never used smokeless tobacco. She reports current alcohol use. She reports current drug use. Drugs: Marijuana and Cocaine.  ALLERGIES: Patient has no known allergies.  MEDICATIONS:  Current Outpatient Medications  Medication Sig Dispense Refill   tiZANidine (ZANAFLEX) 2 MG tablet Take 2 mg by mouth at bedtime as needed.      acetaminophen (TYLENOL) 500 MG tablet Take 1,000 mg by mouth every 6 (six) hours as needed (pain).     ibuprofen (ADVIL) 800 MG tablet Take 200 mg by mouth every 6 (six) hours as needed.     lidocaine-prilocaine (EMLA) cream Apply to affected area once (Patient taking differently: Apply 1 application topically daily as  needed (local anesthetic). Apply to affected area once) 30 g 3   ondansetron (ZOFRAN) 8 MG tablet Take 1 tablet (8 mg total) by mouth every 8 (eight) hours as needed. Start on the third day after chemotherapy. 30 tablet 1   prochlorperazine (COMPAZINE) 10 MG tablet Take 1  tablet (10 mg total) by mouth every 6 (six) hours as needed (Nausea or vomiting). 30 tablet 1   No current facility-administered medications for this encounter.     REVIEW OF SYSTEMS:  A 10+ POINT REVIEW OF SYSTEMS WAS OBTAINED including neurology, dermatology, psychiatry, cardiac, respiratory, lymph, extremities, GI, GU, musculoskeletal, constitutional, reproductive, HEENT. She reports an unintentional weight loss of approximately 12 lbs in 3 months. She reports constant constipation and is only able to produce a bowel movement with a laxative, which at times requires digital removal and causes rectal bleeding. She also reports vaginal bleeding and daily pain, described as cramping in the pelvic area and lower back. She denies dysuria or hematuria, and nausea or vomiting.   PHYSICAL EXAM:  height is _0  (1.676 m) and weight is 125 lb 6.4 oz (56.9 kg). Her oral temperature is 98.3 F (36.8 C). Her blood pressure is 142/95 (abnormal) and her pulse is 80. Her respiration is 20 and oxygen saturation is 100%.   General: Alert and oriented, in no acute distress HEENT: Head is normocephalic. Extraocular movements are intact. Oropharynx is clear. Neck: Neck is supple, no palpable cervical or supraclavicular lymphadenopathy. Heart: Regular in rate and rhythm with no murmurs, rubs, or gallops. Chest: Clear to auscultation bilaterally, with no rhonchi, wheezes, or rales. Abdomen: Soft, nontender, nondistended, with no rigidity or guarding. Extremities: No cyanosis or edema. Lymphatics: see Neck Exam Skin: No concerning lesions. Musculoskeletal: symmetric strength and muscle tone throughout. Neurologic: Cranial nerves II through XII are grossly intact. No obvious focalities. Speech is fluent. Coordination is intact. Psychiatric: Judgment and insight are intact. Affect is appropriate. On pelvic examination the external genitalia were unremarkable. A speculum exam was performed. The cervix is replaced by  exophilic tumor, which bleeds easily with manipulation. On bimanual and rectovaginal examination the cervical mass is estimated to be approximately 5 x 6 cm. There appears to be some extension into the right perimetrium with posterior/lateral extension of tumor and possible side wall involvement. (Per Dr. Elenora Gamma note, she reports extension into left pelvic sidewall.)   ECOG = 1  0 - Asymptomatic (Fully active, able to carry on all predisease activities without restriction)  1 - Symptomatic but completely ambulatory (Restricted in physically strenuous activity but ambulatory and able to carry out work of a light or sedentary nature. For example, light housework, office work)  2 - Symptomatic, <50% in bed during the day (Ambulatory and capable of all self care but unable to carry out any work activities. Up and about more than 50% of waking hours)  3 - Symptomatic, >50% in bed, but not bedbound (Capable of only limited self-care, confined to bed or chair 50% or more of waking hours)  4 - Bedbound (Completely disabled. Cannot carry on any self-care. Totally confined to bed or chair)  5 - Death   Eustace Pen MM, Creech RH, Tormey DC, et al. [phone]). "Toxicity and response criteria of the [facility]". [place] Oncol. 5 (6): 649-55  LABORATORY DATA:  Lab Results  Component Value Date   WBC 8.4 11/25/2018   HGB 13.2 11/25/2018   HCT 39.0 11/25/2018   MCV 87.2 11/25/2018  PLT 337 11/25/2018   NEUTROABS 5.5 11/18/2018   Lab Results  Component Value Date   NA 136 11/25/2018   K 3.5 11/25/2018   CL 98 11/25/2018   CO2 25 11/25/2018   GLUCOSE 81 11/25/2018   CREATININE 1.19 (H) 11/25/2018   CALCIUM 9.3 11/25/2018      RADIOGRAPHY: Ct Chest W Contrast  Result Date: 11/17/2018 CLINICAL DATA:  Cervical cancer. EXAM: CT CHEST, ABDOMEN, AND PELVIS WITH CONTRAST TECHNIQUE: Multidetector CT imaging of the chest, abdomen and pelvis was performed following the standard  protocol during bolus administration of intravenous contrast. CONTRAST:  48m OMNIPAQUE IOHEXOL 300 MG/ML  SOLN COMPARISON:  None. FINDINGS: CT CHEST FINDINGS Cardiovascular: The heart size is normal. No substantial pericardial effusion. Mediastinum/Nodes: No mediastinal lymphadenopathy. There is no hilar lymphadenopathy. The esophagus has normal imaging features. There is no axillary lymphadenopathy. Lungs/Pleura: The central tracheobronchial airways are patent. Biapical pleuroparenchymal scarring evident. No suspicious nodule or mass. No pleural effusion. Musculoskeletal: No worrisome lytic or sclerotic osseous abnormality. Superior endplate compression fracture at T12 appears nonacute. CT ABDOMEN PELVIS FINDINGS Hepatobiliary: No suspicious focal abnormality within the liver parenchyma. There is no evidence for gallstones, gallbladder wall thickening, or pericholecystic fluid. No intrahepatic or extrahepatic biliary dilation. Pancreas: No focal mass lesion. No dilatation of the main duct. No intraparenchymal cyst. No peripancreatic edema. Spleen: No splenomegaly. No focal mass lesion. Adrenals/Urinary Tract: No adrenal nodule or mass. Moderate right hydroureteronephrosis is identified with dilated right ureter down into the pelvis. Left kidney and ureter unremarkable. The urinary bladder appears normal for the degree of distention. Stomach/Bowel: Stomach is unremarkable. No gastric wall thickening. No evidence of outlet obstruction. Duodenum is normally positioned as is the ligament of Treitz. No small bowel wall thickening. No small bowel dilatation. The terminal ileum is normal. No gross colonic mass. No colonic wall thickening. Vascular/Lymphatic: There is abdominal aortic atherosclerosis without aneurysm. No gastrohepatic or hepato duodenal ligament lymphadenopathy. Abnormal soft tissue is seen around the distal aorta involving the IMA origin (see 73/2). This amorphous abnormal soft tissue extends down along  the iliac arteries bilaterally. Reproductive: The endometrial cavity of the uterus is fluid-filled and markedly distended, consistent with cervical obstruction. The cervix is heterogeneous and appears enlarged. Extending posterolaterally to the right from the cervical tissue is a large 6.6 x 3.0 cm necrotic irregular soft tissue mass extending into the right presacral space. The upper vaginal wall thickening evident. Other: Trace free fluid noted in the pelvis. There is presacral edema evident. Musculoskeletal: No worrisome lytic or sclerotic osseous abnormality. IMPRESSION: 1. Irregular enlargement of the cervix with extension of soft tissue from the cervix into the posterior presacral space along the side of the rectum. Wall thickening in the upper vagina is compatible with tumor involvement. The endometrial cavity of the uterus is markedly expanded and fluid-filled consistent with obstruction. No definite involvement of the posterior bladder wall evident by CT. 2. Moderate right hydroureteronephrosis with ureteral dilatation extending down to the right pelvic sidewall adjacent to the cervix. 3. Abnormal, amorphous soft tissue surrounds the distal aorta and common iliac arteries. Imaging appearance concerning for tumor spread. 4. No evidence for metastatic disease in the chest. 5.  Aortic Atherosclerois (ICD10-170.0) Electronically Signed   By: EMisty StanleyM.D.   On: 11/17/2018 16:34   Mr Thoracic Spine W Wo Contrast  Result Date: 11/26/2018 CLINICAL DATA:  Patient with a history of metastatic cervical carcinoma. Focal uptake seen in the central spinal canal at the 11 of  T11-12. Question metastatic disease. EXAM: MRI THORACIC WITHOUT AND WITH CONTRAST TECHNIQUE: Multiplanar and multiecho pulse sequences of the thoracic spine were obtained without and with intravenous contrast. CONTRAST:  5 cc Gadavist IV. COMPARISON:  And PET CT scan 11/20/2018. CT chest, abdomen and pelvis 11/17/2018. FINDINGS: MRI THORACIC  SPINE FINDINGS Alignment:  Normal. Vertebrae: Mild, remote superior endplate compression fracture of T12 is identified as seen on the prior examinations. Vertebral body height loss is up to approximately 15% anteriorly. Bone marrow signal is normal throughout without evidence of metastatic disease. Cord: Normal size, shape and signal throughout. The conus medullaris is normal in signal and position at the T12-L1 level. No enhancement of the cord is identified. No lesion within the central spinal canal is present. Paraspinal and other soft tissues: See report of prior cross sectional imaging studies. Right hydronephrosis noted. Disc levels: The central spinal canal and neural foramina are widely patent throughout. Minimal retropulsion off the superior endplate of G92 is noted. IMPRESSION: Negative for metastatic disease. No abnormality to correlate with potential lesion within the spinal canal at T11-12 as seen on prior CT is identified. Remote, mild superior endplate compression fracture of T12. Widely patent central canal and foramina throughout. Right hydronephrosis as seen on prior studies. Electronically Signed   By: Inge Rise M.D.   On: 11/26/2018 14:08   Ct Abdomen Pelvis W Contrast  Result Date: 11/17/2018 CLINICAL DATA:  Cervical cancer. EXAM: CT CHEST, ABDOMEN, AND PELVIS WITH CONTRAST TECHNIQUE: Multidetector CT imaging of the chest, abdomen and pelvis was performed following the standard protocol during bolus administration of intravenous contrast. CONTRAST:  34m OMNIPAQUE IOHEXOL 300 MG/ML  SOLN COMPARISON:  None. FINDINGS: CT CHEST FINDINGS Cardiovascular: The heart size is normal. No substantial pericardial effusion. Mediastinum/Nodes: No mediastinal lymphadenopathy. There is no hilar lymphadenopathy. The esophagus has normal imaging features. There is no axillary lymphadenopathy. Lungs/Pleura: The central tracheobronchial airways are patent. Biapical pleuroparenchymal scarring evident. No  suspicious nodule or mass. No pleural effusion. Musculoskeletal: No worrisome lytic or sclerotic osseous abnormality. Superior endplate compression fracture at T12 appears nonacute. CT ABDOMEN PELVIS FINDINGS Hepatobiliary: No suspicious focal abnormality within the liver parenchyma. There is no evidence for gallstones, gallbladder wall thickening, or pericholecystic fluid. No intrahepatic or extrahepatic biliary dilation. Pancreas: No focal mass lesion. No dilatation of the main duct. No intraparenchymal cyst. No peripancreatic edema. Spleen: No splenomegaly. No focal mass lesion. Adrenals/Urinary Tract: No adrenal nodule or mass. Moderate right hydroureteronephrosis is identified with dilated right ureter down into the pelvis. Left kidney and ureter unremarkable. The urinary bladder appears normal for the degree of distention. Stomach/Bowel: Stomach is unremarkable. No gastric wall thickening. No evidence of outlet obstruction. Duodenum is normally positioned as is the ligament of Treitz. No small bowel wall thickening. No small bowel dilatation. The terminal ileum is normal. No gross colonic mass. No colonic wall thickening. Vascular/Lymphatic: There is abdominal aortic atherosclerosis without aneurysm. No gastrohepatic or hepato duodenal ligament lymphadenopathy. Abnormal soft tissue is seen around the distal aorta involving the IMA origin (see 73/2). This amorphous abnormal soft tissue extends down along the iliac arteries bilaterally. Reproductive: The endometrial cavity of the uterus is fluid-filled and markedly distended, consistent with cervical obstruction. The cervix is heterogeneous and appears enlarged. Extending posterolaterally to the right from the cervical tissue is a large 6.6 x 3.0 cm necrotic irregular soft tissue mass extending into the right presacral space. The upper vaginal wall thickening evident. Other: Trace free fluid noted in the pelvis. There  is presacral edema evident. Musculoskeletal:  No worrisome lytic or sclerotic osseous abnormality. IMPRESSION: 1. Irregular enlargement of the cervix with extension of soft tissue from the cervix into the posterior presacral space along the side of the rectum. Wall thickening in the upper vagina is compatible with tumor involvement. The endometrial cavity of the uterus is markedly expanded and fluid-filled consistent with obstruction. No definite involvement of the posterior bladder wall evident by CT. 2. Moderate right hydroureteronephrosis with ureteral dilatation extending down to the right pelvic sidewall adjacent to the cervix. 3. Abnormal, amorphous soft tissue surrounds the distal aorta and common iliac arteries. Imaging appearance concerning for tumor spread. 4. No evidence for metastatic disease in the chest. 5.  Aortic Atherosclerois (ICD10-170.0) Electronically Signed   By: Misty Stanley M.D.   On: 11/17/2018 16:34   Nm Pet Image Initial (pi) Skull Base To Thigh  Result Date: 11/20/2018 CLINICAL DATA:  Initial treatment strategy for cervical cancer. EXAM: NUCLEAR MEDICINE PET SKULL BASE TO THIGH TECHNIQUE: 6.2 mCi F-18 FDG was injected intravenously. Full-ring PET imaging was performed from the skull base to thigh after the radiotracer. CT data was obtained and used for attenuation correction and anatomic localization. Fasting blood glucose: 94 mg/dl COMPARISON:  CT abdomen/pelvis 11/17/2018 FINDINGS: Mediastinal blood pool activity: SUV max 2.1 NECK: No hypermetabolic lymph nodes in the neck. Areas of FDG uptake identified in the maxilla and mandible, potentially related to periodontal disease. Incidental CT findings: none CHEST: 7 mm short axis AP window lymph node (59/4) shows low level uptake with SUV max = 4.2. Foci of hypermetabolic FDG accumulation are identified in both hilar regions without underlying lymph nodes evident on noncontrast CT imaging. SUV max = 3.6 right hilum. Incidental CT findings: Biapical pleuroparenchymal scarring  evident. No suspicious pulmonary nodule or mass. No pleural effusion. ABDOMEN/PELVIS: No abnormal hypermetabolic activity within the liver, pancreas, adrenal glands, or spleen. The abnormal para-aortic soft tissue seen just proximal to the bifurcation on the previous exam is hypermetabolic with SUV max = 4.5. 8 mm short axis left pelvic sidewall lymph node (498/2) is hypermetabolic with SUV max = 6.2. 7 x 11 mm nodule posterior right pelvis (641/5) is hypermetabolic with SUV max = 4.3. XR cervical direct extension of tumor posterolaterally on the right is seen previously is hypermetabolic with SUV max = 83.0. Incidental CT findings: Similar appearance milder it right hydroureteronephrosis with ureteral obstruction in the region of the cervical lesion. Asymmetric bladder wall thickening posteriorly on the right (167/4) is more prominent on the current study than previously. Radiotracer in the bladder urine obscures assessment of the bladder wall. SKELETON: No focal hypermetabolic activity to suggest skeletal metastasis. Focal hypermetabolism identified in the spinal canal at the level of T11-12 without underlying lesion evident on noncontrast CT. SUV max = 3.2. Incidental CT findings: none IMPRESSION: 1. Cervical lesion with direct extra cervical extension posterolaterally on the right is markedly hypermetabolic consistent with known neoplasm. 2. 7 x 11 mm posterior right pelvic irregular nodule is hypermetabolic consistent with metastatic disease. 3. Para-aortic soft tissue in the region of the bifurcation and 8 mm short axis left pelvic sidewall lymph node show marked hypermetabolism, consistent with metastatic involvement. 4. Low level FDG uptake identified in a unenlarged AP window lymph node of the mediastinum with focal FDG uptake identified in each hilum. While no underlying hilar lymph nodes evident on this noncontrast CT exam, tiny lymph nodes were visible in the hilar regions on diagnostic CT of 11/17/2018.  While  this may be reactive given the low level uptake, metastatic disease cannot be definitively excluded. 5. Focus of hypermetabolic activity in the spinal canal at the T11-12 level. No underlying mass lesion evident on noncontrast CT imaging. Thoracic spine MRI without and with contrast may be warranted to further evaluate. 6. Stable moderate right hydroureteronephrosis. Electronically Signed   By: Misty Stanley M.D.   On: 11/20/2018 17:24   Ir Imaging Guided Port Insertion  Result Date: 11/25/2018 CLINICAL DATA:  Cervical carcinoma and need for porta cath to begin chemotherapy. EXAM: IMPLANTED PORT A CATH PLACEMENT WITH ULTRASOUND AND FLUOROSCOPIC GUIDANCE ANESTHESIA/SEDATION: 2.0 mg IV Versed; 100 mcg IV Fentanyl Total Moderate Sedation Time:  30 minutes The patient's level of consciousness and physiologic status were continuously monitored during the procedure by Radiology nursing. Additional Medications: 2 g IV Ancef. FLUOROSCOPY TIME:  12 seconds.  0.4 mGy. PROCEDURE: The procedure, risks, benefits, and alternatives were explained to the patient. Questions regarding the procedure were encouraged and answered. The patient understands and consents to the procedure. A time-out was performed prior to initiating the procedure. Ultrasound was utilized to confirm patency of the right internal jugular vein. The right neck and chest were prepped with chlorhexidine in a sterile fashion, and a sterile drape was applied covering the operative field. Maximum barrier sterile technique with sterile gowns and gloves were used for the procedure. Local anesthesia was provided with 1% lidocaine. After creating a small venotomy incision, a 21 gauge needle was advanced into the right internal jugular vein under direct, real-time ultrasound guidance. Ultrasound image documentation was performed. After securing guidewire access, an 8 Fr dilator was placed. A J-wire was kinked to measure appropriate catheter length. A  subcutaneous port pocket was then created along the upper chest wall utilizing sharp and blunt dissection. Portable cautery was utilized. The pocket was irrigated with sterile saline. A single lumen power injectable port was chosen for placement. The 8 Fr catheter was tunneled from the port pocket site to the venotomy incision. The port was placed in the pocket. External catheter was trimmed to appropriate length based on guidewire measurement. At the venotomy, an 8 Fr peel-away sheath was placed over a guidewire. The catheter was then placed through the sheath and the sheath removed. Final catheter positioning was confirmed and documented with a fluoroscopic spot image. The port was accessed with a needle and aspirated and flushed with heparinized saline. The access needle was removed. The venotomy and port pocket incisions were closed with subcutaneous 3-0 Monocryl and subcuticular 4-0 Vicryl. Dermabond was applied to both incisions. COMPLICATIONS: COMPLICATIONS None FINDINGS: After catheter placement, the tip lies at the cavo-atrial junction. The catheter aspirates normally and is ready for immediate use. IMPRESSION: Placement of single lumen port a cath via right internal jugular vein. The catheter tip lies at the cavo-atrial junction. A power injectable port a cath was placed and is ready for immediate use. Electronically Signed   By: Aletta Edouard M.D.   On: 11/25/2018 10:49      IMPRESSION: Stage IIIb Clinical Squamous Cell Carcinoma of the Cervix CT scan yesterday shows no distant metastasis but possible retroperineal adenopathy. Patient has been approved for PET scan, which is scheduled for 11/20/2018. The patient would be a good candidate for a definitive course of radiation therapy along with radiosensitizing chemotherapy. Patient would also benefit from intracavitary brachytherapy as part of her overall plan. Patient will receive either pelvis or pelvis and periaortic radiation treatment depending  on results of upcoming  PET scan. The patient's CT scan also shows right hydronephrosis and, depending on lab work ordered by Dr. Alvy Bimler today, she may require urology consultation with possible stent placement.  Today, I talked to the patient and about the findings and work-up thus far.  We discussed the natural history of cervical cancer and general treatment, highlighting the role of radiotherapy in the management.  We discussed the available radiation techniques, and focused on the details of logistics and delivery.  We reviewed the anticipated acute and late sequelae associated with radiation in this setting.  The patient was encouraged to ask questions that I answered to the best of my ability.  A patient consent form was discussed and signed.  We retained a copy for our records.  The patient would like to proceed with radiation and will be scheduled for CT simulation.  PLAN: Patient will return 11/23/2018 for CT simulation with treatment to begin 11/30/2018 concomitate with radiosensitizing chemotherapy. Anticipate 6 weeks of external beam radiation and 5 intracavitary brachytherapy treatments.    ------------------------------------------------  Blair Promise, PhD, MD  This document serves as a record of services personally performed by Gery Pray, MD. It was created on his behalf by Wilburn Mylar, a trained medical scribe. The creation of this record is based on the scribe's personal observations and the provider's statements to them. This document has been checked and approved by the attending provider.

## 2018-11-19 DIAGNOSIS — G8929 Other chronic pain: Secondary | ICD-10-CM | POA: Insufficient documentation

## 2018-11-19 DIAGNOSIS — Z7189 Other specified counseling: Secondary | ICD-10-CM | POA: Insufficient documentation

## 2018-11-19 DIAGNOSIS — Z720 Tobacco use: Secondary | ICD-10-CM | POA: Insufficient documentation

## 2018-11-19 DIAGNOSIS — N1330 Unspecified hydronephrosis: Secondary | ICD-10-CM | POA: Insufficient documentation

## 2018-11-19 DIAGNOSIS — K5909 Other constipation: Secondary | ICD-10-CM | POA: Insufficient documentation

## 2018-11-19 DIAGNOSIS — M549 Dorsalgia, unspecified: Secondary | ICD-10-CM

## 2018-11-19 NOTE — Assessment & Plan Note (Signed)
We discussed the importance of nicotine cessation She is interested to quit on her own

## 2018-11-19 NOTE — Progress Notes (Signed)
Jennifer Santos NOTE  Patient Care Team: Marguerita Merles, MD as PCP - General (Family Medicine) Gae Dry, MD as Referring Physician (Obstetrics and Gynecology) Clent Jacks, RN as Registered Nurse  ASSESSMENT & PLAN:  Cervical ca [facility]) I have reviewed imaging study with the patient She does not have clinical metastatic disease on CT imaging PET CT scan is scheduled for the end of the week  We discussed blood work, chemo education class and port placement We discussed the role of chemotherapy. The intent is of curative intent.  We discussed some of the risks, benefits, side-effects of cisplatin and its role as chemo sensitizing agent. The plan for weekly cisplatin for x5 doses along with radiation treatment.  Some of the short term side-effects included, though not limited to, including weight loss, life threatening infections, risk of allergic reactions, need for transfusions of blood products, nausea, vomiting, change in bowel habits, loss of hair, admission to hospital for various reasons, and risks of death.   Long term side-effects are also discussed including risks of infertility, permanent damage to nerve function, hearing loss, chronic fatigue, kidney damage with possibility needing hemodialysis, and rare secondary malignancy including bone marrow disorders.  The patient is aware that the response rates discussed earlier is not guaranteed.  After a long discussion, patient made an informed decision to proceed with the prescribed plan of care.   Patient education material was dispensed.  Chronic back pain greater than 3 months duration Her chronic back pain could be due to compression fracture versus her disease For now, I recommend over-the-counter analgesics  Hydronephrosis of right kidney Her renal function is stable We discussed the risk and benefits of urology procedure with stent before treatment For now, I am not enthusiastic to pursue  this route  Tobacco abuse We discussed the importance of nicotine cessation She is interested to quit on her own  Other constipation She has constipation due to her compression of her rectum from her disease We discussed chronic laxative therapy  Goals of care, counseling/discussion Goal of treatment is of curative intent   Orders Placed This Encounter  Procedures  . IR IMAGING GUIDED PORT INSERTION    Standing Status:   Future    Standing Expiration Date:   01/18/2020    Order Specific Question:   Reason for Exam (SYMPTOM  OR DIAGNOSIS REQUIRED)    Answer:   need port for chemo to start 4/24    Order Specific Question:   Is the patient pregnant?    Answer:   No    Order Specific Question:   Preferred Imaging Location?    Answer:   [facility]  . CBC with Differential/Platelet    Standing Status:   Standing    Number of Occurrences:   22    Standing Expiration Date:   11/18/2019  . CMP ([place] only)    Standing Status:   Standing    Number of Occurrences:   22    Standing Expiration Date:   11/18/2019  . Magnesium    Standing Status:   Standing    Number of Occurrences:   22    Standing Expiration Date:   11/18/2019     CHIEF COMPLAINTS/PURPOSE OF CONSULTATION:  Locally advanced cervical cancer, for further management  HISTORY OF PRESENTING ILLNESS:  Jennifer Santos 53 y.o. female is here because of recent diagnosis of cervical cancer The patient had history of abnormal Pap smear She also have  postmenopausal bleeding for almost a year Her symptoms began with lower back pain.  She was evaluated with imaging study which show compression fracture Subsequently, she was evaluated by GYN and had biopsy that confirmed diagnosis of cancer She also have new onset of changes in bowel habits with constipation Her risk factors include chronic smoking history  I have reviewed her chart and materials related to her cancer extensively and collaborated history with the  patient. Summary of oncologic history is as follows:   Cervical ca ([place])   11/03/2018 Initial Diagnosis    She presented with postmenopausal bleeding since 2019 (at least 1 year) and weight loss. Had prior abnormal PAP smear, last done in 2010    11/04/2018 Pathology Results    Endometrium, biopsy - INVASIVE MODERATELY DIFFERENTIATED SQUAMOUS CELL CARCINOMA. SEE NOTE Diagnosis Note Squamous cell carcinoma arises in a background of high-grade dysplasia with possible glandular involvement. Tumor cells are positive for CK5/6, consistent with the above diagnosis. Immunohistochemical stain for p16 is diffusely positive in the tumor cells.    11/17/2018 Cancer Staging    Staging form: Cervix Uteri, AJCC 8th Edition - Clinical: FIGO Stage IIIB (cT3b, cN0, cM0) - Signed by Heath Lark, MD on 11/17/2018     MEDICAL HISTORY:  Past Medical History:  Diagnosis Date  . #[id number] dx'd 10/20/18    SURGICAL HISTORY: Past Surgical History:  Procedure Laterality Date  . BACK SURGERY  1985   Removal of superficial mass  . TUBAL LIGATION      SOCIAL HISTORY: Social History   Socioeconomic History  . Marital status: Legally Separated    Spouse name: Not on file  . Number of children: 2  . Years of education: Not on file  . Highest education level: Not on file  Occupational History  . Occupation: maintenance  Social Needs  . Financial resource strain: Not on file  . Food insecurity:    Worry: Not on file    Inability: Not on file  . Transportation needs:    Medical: Not on file    Non-medical: Not on file  Tobacco Use  . Smoking status: Current Every Day Smoker    Packs/day: 0.50    Types: Cigarettes  . Smokeless tobacco: Never Used  Substance and Sexual Activity  . Alcohol use: Yes    Comment: Social   . Drug use: Yes    Types: Marijuana, Cocaine  . Sexual activity: Not Currently  Lifestyle  . Physical activity:    Days per week: Not on file    Minutes per session: Not on file  .  Stress: Not on file  Relationships  . Social connections:    Talks on phone: Not on file    Gets together: Not on file    Attends religious service: Not on file    Active member of club or organization: Not on file    Attends meetings of clubs or organizations: Not on file    Relationship status: Not on file  . Intimate partner violence:    Fear of current or ex partner: Not on file    Emotionally abused: Not on file    Physically abused: Not on file    Forced sexual activity: Not on file  Other Topics Concern  . Not on file  Social History Narrative   AJ, oldest son is MPOA   Edison Nasuti is 59 and lives with patient    FAMILY HISTORY: Family History  Problem Relation Age of Onset  .  Cancer Father   . Brain cancer Father   . Lung cancer Father     ALLERGIES:  has No Known Allergies.  MEDICATIONS:  Current Outpatient Medications  Medication Sig Dispense Refill  . lidocaine-prilocaine (EMLA) cream Apply to affected area once 30 g 3  . ondansetron (ZOFRAN) 8 MG tablet Take 1 tablet (8 mg total) by mouth every 8 (eight) hours as needed. Start on the third day after chemotherapy. 30 tablet 1  . prochlorperazine (COMPAZINE) 10 MG tablet Take 1 tablet (10 mg total) by mouth every 6 (six) hours as needed (Nausea or vomiting). 30 tablet 1  . tiZANidine (ZANAFLEX) 2 MG tablet      No current facility-administered medications for this visit.     REVIEW OF SYSTEMS:   Constitutional: Denies fevers, chills or abnormal night sweats Eyes: Denies blurriness of vision, double vision or watery eyes Ears, nose, mouth, throat, and face: Denies mucositis or sore throat Respiratory: Denies cough, dyspnea or wheezes Cardiovascular: Denies palpitation, chest discomfort or lower extremity swelling Skin: Denies abnormal skin rashes Lymphatics: Denies new lymphadenopathy or easy bruising Neurological:Denies numbness, tingling or new weaknesses Behavioral/Psych: Mood is stable, no new changes  All  other systems were reviewed with the patient and are negative.  PHYSICAL EXAMINATION: ECOG PERFORMANCE STATUS: 1 - Symptomatic but completely ambulatory  Vitals:   11/18/18 1002  BP: (!) 136/93  Pulse: 66  Resp: 18  Temp: 98.3 F (36.8 C)  SpO2: 100%   Filed Weights   11/18/18 1002  Weight: 128 lb 6.4 oz (58.2 kg)    GENERAL:alert, no distress and comfortable SKIN: skin color, texture, turgor are normal, no rashes or significant lesions EYES: normal, conjunctiva are pink and non-injected, sclera clear OROPHARYNX:no exudate, no erythema and lips, buccal mucosa, and tongue normal  NECK: supple, thyroid normal size, non-tender, without nodularity LYMPH:  no palpable lymphadenopathy in the cervical, axillary or inguinal LUNGS: clear to auscultation and percussion with normal breathing effort HEART: regular rate & rhythm and no murmurs and no lower extremity edema ABDOMEN:abdomen soft, non-tender and normal bowel sounds Musculoskeletal:no cyanosis of digits and no clubbing  PSYCH: alert & oriented x 3 with fluent speech NEURO: no focal motor/sensory deficits  LABORATORY DATA:  I have reviewed the data as listed Lab Results  Component Value Date   WBC 7.6 11/18/2018   HGB 13.4 11/18/2018   HCT 40.8 11/18/2018   MCV 89.9 11/18/2018   PLT 264 11/18/2018   Recent Labs    09/01/18 1155 11/18/18 1140  NA 135 139  K 3.4* 4.5  CL 101 101  CO2 26 24  GLUCOSE 113* 92  BUN 10 14  CREATININE 0.75 1.00  CALCIUM 9.2 9.3  GFRNONAA >60 >60  GFRAA >60 >60  PROT 7.8 7.6  ALBUMIN 4.4 4.0  AST 20 17  ALT 13 13  ALKPHOS 74 81  BILITOT 0.5 <0.2*    RADIOGRAPHIC STUDIES: I have reviewed imaging study with the patient I have personally reviewed the radiological images as listed and agreed with the findings in the report. Dg Lumbar Spine Complete  Result Date: 10/20/2018 CLINICAL DATA:  53 year old female with right-sided lower back pain. Hurts when sitting or standing in same  position for long periods of time. Pain for 8 months. No known injury. Initial encounter. EXAM: LUMBAR SPINE - COMPLETE 4+ VIEW COMPARISON:  09/01/2018 abdominal film. FINDINGS: T12 superior endplate compression fracture of indeterminate age. Subsequent anterior wedge compression deformity with 50% loss of height  anteriorly and 10% loss of height posteriorly. Slight retropulsion posterosuperior aspect of the T12 vertebra. Transitional L5 vertebral body. Spina bifida occulta L5 incidentally noted.No lumbar compression fracture, pars defect or significant disc space narrowing. Mild right-sided L4-5 and L5-S1 facet degenerative changes. IMPRESSION: 1. T12 superior endplate compression fracture of indeterminate age. Subsequent anterior wedge compression deformity with 50% loss of height anteriorly and 10% loss of height posteriorly. Slight retropulsion posterosuperior aspect of the T12 vertebra. 2. Transitional L5 vertebral body. Spina bifida occulta L5 incidentally noted. 3. No lumbar compression fracture, pars defect or significant disc space narrowing. 4. Mild right-sided L4-5 and L5-S1 facet degenerative changes. These results will be called to the ordering clinician or representative by the Radiologist Assistant, and communication documented in the PACS or zVision Dashboard. Electronically Signed   By: Genia Del M.D.   On: 10/20/2018 19:15   Ct Chest W Contrast  Result Date: 11/17/2018 CLINICAL DATA:  Cervical cancer. EXAM: CT CHEST, ABDOMEN, AND PELVIS WITH CONTRAST TECHNIQUE: Multidetector CT imaging of the chest, abdomen and pelvis was performed following the standard protocol during bolus administration of intravenous contrast. CONTRAST:  81mL OMNIPAQUE IOHEXOL 300 MG/ML  SOLN COMPARISON:  None. FINDINGS: CT CHEST FINDINGS Cardiovascular: The heart size is normal. No substantial pericardial effusion. Mediastinum/Nodes: No mediastinal lymphadenopathy. There is no hilar lymphadenopathy. The esophagus has  normal imaging features. There is no axillary lymphadenopathy. Lungs/Pleura: The central tracheobronchial airways are patent. Biapical pleuroparenchymal scarring evident. No suspicious nodule or mass. No pleural effusion. Musculoskeletal: No worrisome lytic or sclerotic osseous abnormality. Superior endplate compression fracture at T12 appears nonacute. CT ABDOMEN PELVIS FINDINGS Hepatobiliary: No suspicious focal abnormality within the liver parenchyma. There is no evidence for gallstones, gallbladder wall thickening, or pericholecystic fluid. No intrahepatic or extrahepatic biliary dilation. Pancreas: No focal mass lesion. No dilatation of the main duct. No intraparenchymal cyst. No peripancreatic edema. Spleen: No splenomegaly. No focal mass lesion. Adrenals/Urinary Tract: No adrenal nodule or mass. Moderate right hydroureteronephrosis is identified with dilated right ureter down into the pelvis. Left kidney and ureter unremarkable. The urinary bladder appears normal for the degree of distention. Stomach/Bowel: Stomach is unremarkable. No gastric wall thickening. No evidence of outlet obstruction. Duodenum is normally positioned as is the ligament of Treitz. No small bowel wall thickening. No small bowel dilatation. The terminal ileum is normal. No gross colonic mass. No colonic wall thickening. Vascular/Lymphatic: There is abdominal aortic atherosclerosis without aneurysm. No gastrohepatic or hepato duodenal ligament lymphadenopathy. Abnormal soft tissue is seen around the distal aorta involving the IMA origin (see 73/2). This amorphous abnormal soft tissue extends down along the iliac arteries bilaterally. Reproductive: The endometrial cavity of the uterus is fluid-filled and markedly distended, consistent with cervical obstruction. The cervix is heterogeneous and appears enlarged. Extending posterolaterally to the right from the cervical tissue is a large 6.6 x 3.0 cm necrotic irregular soft tissue mass  extending into the right presacral space. The upper vaginal wall thickening evident. Other: Trace free fluid noted in the pelvis. There is presacral edema evident. Musculoskeletal: No worrisome lytic or sclerotic osseous abnormality. IMPRESSION: 1. Irregular enlargement of the cervix with extension of soft tissue from the cervix into the posterior presacral space along the side of the rectum. Wall thickening in the upper vagina is compatible with tumor involvement. The endometrial cavity of the uterus is markedly expanded and fluid-filled consistent with obstruction. No definite involvement of the posterior bladder wall evident by CT. 2. Moderate right hydroureteronephrosis with ureteral dilatation extending  down to the right pelvic sidewall adjacent to the cervix. 3. Abnormal, amorphous soft tissue surrounds the distal aorta and common iliac arteries. Imaging appearance concerning for tumor spread. 4. No evidence for metastatic disease in the chest. 5.  Aortic Atherosclerois (ICD10-170.0) Electronically Signed   By: Misty Stanley M.D.   On: 11/17/2018 16:34   Ct Abdomen Pelvis W Contrast  Result Date: 11/17/2018 CLINICAL DATA:  Cervical cancer. EXAM: CT CHEST, ABDOMEN, AND PELVIS WITH CONTRAST TECHNIQUE: Multidetector CT imaging of the chest, abdomen and pelvis was performed following the standard protocol during bolus administration of intravenous contrast. CONTRAST:  55mL OMNIPAQUE IOHEXOL 300 MG/ML  SOLN COMPARISON:  None. FINDINGS: CT CHEST FINDINGS Cardiovascular: The heart size is normal. No substantial pericardial effusion. Mediastinum/Nodes: No mediastinal lymphadenopathy. There is no hilar lymphadenopathy. The esophagus has normal imaging features. There is no axillary lymphadenopathy. Lungs/Pleura: The central tracheobronchial airways are patent. Biapical pleuroparenchymal scarring evident. No suspicious nodule or mass. No pleural effusion. Musculoskeletal: No worrisome lytic or sclerotic osseous  abnormality. Superior endplate compression fracture at T12 appears nonacute. CT ABDOMEN PELVIS FINDINGS Hepatobiliary: No suspicious focal abnormality within the liver parenchyma. There is no evidence for gallstones, gallbladder wall thickening, or pericholecystic fluid. No intrahepatic or extrahepatic biliary dilation. Pancreas: No focal mass lesion. No dilatation of the main duct. No intraparenchymal cyst. No peripancreatic edema. Spleen: No splenomegaly. No focal mass lesion. Adrenals/Urinary Tract: No adrenal nodule or mass. Moderate right hydroureteronephrosis is identified with dilated right ureter down into the pelvis. Left kidney and ureter unremarkable. The urinary bladder appears normal for the degree of distention. Stomach/Bowel: Stomach is unremarkable. No gastric wall thickening. No evidence of outlet obstruction. Duodenum is normally positioned as is the ligament of Treitz. No small bowel wall thickening. No small bowel dilatation. The terminal ileum is normal. No gross colonic mass. No colonic wall thickening. Vascular/Lymphatic: There is abdominal aortic atherosclerosis without aneurysm. No gastrohepatic or hepato duodenal ligament lymphadenopathy. Abnormal soft tissue is seen around the distal aorta involving the IMA origin (see 73/2). This amorphous abnormal soft tissue extends down along the iliac arteries bilaterally. Reproductive: The endometrial cavity of the uterus is fluid-filled and markedly distended, consistent with cervical obstruction. The cervix is heterogeneous and appears enlarged. Extending posterolaterally to the right from the cervical tissue is a large 6.6 x 3.0 cm necrotic irregular soft tissue mass extending into the right presacral space. The upper vaginal wall thickening evident. Other: Trace free fluid noted in the pelvis. There is presacral edema evident. Musculoskeletal: No worrisome lytic or sclerotic osseous abnormality. IMPRESSION: 1. Irregular enlargement of the cervix  with extension of soft tissue from the cervix into the posterior presacral space along the side of the rectum. Wall thickening in the upper vagina is compatible with tumor involvement. The endometrial cavity of the uterus is markedly expanded and fluid-filled consistent with obstruction. No definite involvement of the posterior bladder wall evident by CT. 2. Moderate right hydroureteronephrosis with ureteral dilatation extending down to the right pelvic sidewall adjacent to the cervix. 3. Abnormal, amorphous soft tissue surrounds the distal aorta and common iliac arteries. Imaging appearance concerning for tumor spread. 4. No evidence for metastatic disease in the chest. 5.  Aortic Atherosclerois (ICD10-170.0) Electronically Signed   By: Misty Stanley M.D.   On: 11/17/2018 16:34    I spent 65 minutes counseling the patient face to face. The total time spent in the appointment was 60 minutes and more than 50% was on counseling.  All questions  were answered. The patient knows to call the clinic with any problems, questions or concerns.  Heath Lark, MD 11/19/2018 9:08 AM

## 2018-11-19 NOTE — Assessment & Plan Note (Signed)
I have reviewed imaging study with the patient She does not have clinical metastatic disease on CT imaging PET CT scan is scheduled for the end of the week  We discussed blood work, chemo education class and port placement We discussed the role of chemotherapy. The intent is of curative intent.  We discussed some of the risks, benefits, side-effects of cisplatin and its role as chemo sensitizing agent. The plan for weekly cisplatin for x5 doses along with radiation treatment.  Some of the short term side-effects included, though not limited to, including weight loss, life threatening infections, risk of allergic reactions, need for transfusions of blood products, nausea, vomiting, change in bowel habits, loss of hair, admission to hospital for various reasons, and risks of death.   Long term side-effects are also discussed including risks of infertility, permanent damage to nerve function, hearing loss, chronic fatigue, kidney damage with possibility needing hemodialysis, and rare secondary malignancy including bone marrow disorders.  The patient is aware that the response rates discussed earlier is not guaranteed.  After a long discussion, patient made an informed decision to proceed with the prescribed plan of care.   Patient education material was dispensed.

## 2018-11-19 NOTE — Assessment & Plan Note (Signed)
Her chronic back pain could be due to compression fracture versus her disease For now, I recommend over-the-counter analgesics

## 2018-11-19 NOTE — Assessment & Plan Note (Signed)
She has constipation due to her compression of her rectum from her disease We discussed chronic laxative therapy

## 2018-11-19 NOTE — Assessment & Plan Note (Signed)
Her renal function is stable We discussed the risk and benefits of urology procedure with stent before treatment For now, I am not enthusiastic to pursue this route

## 2018-11-19 NOTE — Assessment & Plan Note (Signed)
Goal of treatment is of curative intent

## 2018-11-20 ENCOUNTER — Other Ambulatory Visit: Payer: Self-pay

## 2018-11-20 ENCOUNTER — Ambulatory Visit (HOSPITAL_COMMUNITY)
Admission: RE | Admit: 2018-11-20 | Discharge: 2018-11-20 | Disposition: A | Discharge status: Home / Self Care | Payer: BLUE CROSS/BLUE SHIELD | Source: Ambulatory Visit | Attending: Gynecologic Oncology | Admitting: Gynecologic Oncology

## 2018-11-20 DIAGNOSIS — C531 Malignant neoplasm of exocervix: Secondary | ICD-10-CM | POA: Insufficient documentation

## 2018-11-20 LAB — GLUCOSE, CAPILLARY: Glucose-Capillary: 94 mg/dL (ref 70–99)

## 2018-11-20 MED ORDER — FLUDEOXYGLUCOSE F - 18 (FDG) INJECTION
6.2400 | Freq: Once | INTRAVENOUS | Status: AC
  Administered 2018-11-20: 15:00:00 6.24 via INTRAVENOUS

## 2018-11-23 ENCOUNTER — Ambulatory Visit
Admission: RE | Admit: 2018-11-23 | Discharge: 2018-11-23 | Disposition: A | Discharge status: Home / Self Care | Payer: BLUE CROSS/BLUE SHIELD | Source: Ambulatory Visit | Attending: Radiation Oncology | Admitting: Radiation Oncology

## 2018-11-23 ENCOUNTER — Encounter: Payer: Self-pay | Admitting: Gynecologic Oncology

## 2018-11-23 ENCOUNTER — Telehealth: Payer: Self-pay | Admitting: *Deleted

## 2018-11-23 ENCOUNTER — Other Ambulatory Visit: Payer: Self-pay | Admitting: Gynecologic Oncology

## 2018-11-23 ENCOUNTER — Other Ambulatory Visit: Payer: Self-pay

## 2018-11-23 ENCOUNTER — Telehealth: Payer: Self-pay | Admitting: Oncology

## 2018-11-23 DIAGNOSIS — C531 Malignant neoplasm of exocervix: Secondary | ICD-10-CM | POA: Insufficient documentation

## 2018-11-23 DIAGNOSIS — Z510 Encounter for antineoplastic radiation therapy: Secondary | ICD-10-CM | POA: Diagnosis present

## 2018-11-23 DIAGNOSIS — R948 Abnormal results of function studies of other organs and systems: Secondary | ICD-10-CM

## 2018-11-23 DIAGNOSIS — D4959 Neoplasm of unspecified behavior of other genitourinary organ: Secondary | ICD-10-CM

## 2018-11-23 DIAGNOSIS — M549 Dorsalgia, unspecified: Secondary | ICD-10-CM

## 2018-11-23 DIAGNOSIS — G8929 Other chronic pain: Secondary | ICD-10-CM

## 2018-11-23 NOTE — Telephone Encounter (Signed)
Jennifer Santos with appointment for the MRI thoracic spine on Thursday, 11/26/18 with arrival at 11:30 am.  She verbalized agreement.

## 2018-11-23 NOTE — Progress Notes (Signed)
  Radiation Oncology         (336) [phone] ________________________________  Name: Jennifer Santos MRN: [id number]  Date: 11/23/2018  DOB: 1965/08/21  SIMULATION AND TREATMENT PLANNING NOTE    ICD-10-CM   1. Malignant neoplasm of exocervix ([place]) C53.1    DIAGNOSIS:  Stage IIIC-2  Squamous Cell Carcinoma of the Cervix  NARRATIVE:  The patient was brought to the [facility] suite.  Identity was confirmed.  All relevant records and images related to the planned course of therapy were reviewed.  The patient freely provided informed written consent to proceed with treatment after reviewing the details related to the planned course of therapy. The consent form was witnessed and verified by the simulation staff.  Then, the patient was set-up in a stable reproducible  supine position for radiation therapy.  CT images were obtained.  Surface markings were placed.  The CT images were loaded into the planning software.  Then the target and avoidance structures were contoured.  Treatment planning then occurred.  The radiation prescription was entered and confirmed.  Then, I designed and supervised the construction of a total of 2 medically necessary complex treatment devices.  I have requested : Intensity Modulated Radiotherapy (IMRT) is medically necessary for this case for the following reason:  Small bowel sparing and kidney sparing.  I have ordered:dose calc.   Patient  PLAN:  The patient will receive 45 Gy in 25 fractions directed at the pelvis and periaortic area.  In addition the patient will receive radiosensitizing chemotherapy during the first 5 weeks of her radiation therapy.  Patient will then proceed with a boost to involved nodes in the pelvis and periaortic region as well as a parametrial/sidewall boost.  She will  then proceed with 5 intracavitary high dose rate brachytherapy  treatments directed at the cervical region.  Iridium 192 will be the high-dose-rate source.   Special Treatment  Procedure Note: The patient will be receiving radiosensitizing chemotherapy. Given the potential of increased toxicities related to combined therapy and the necessity for close monitoring of the patient and blood work, this constitutes a special treatment procedure.  sHe will also be receiving brachii therapy which constitutes a special treatment procedure.    Blair Promise, PhD, MD  This document serves as a record of services personally performed by Gery Pray, MD. It was created on his behalf by Wilburn Mylar, a trained medical scribe. The creation of this record is based on the scribe's personal observations and the provider's statements to them. This document has been checked and approved by the attending provider.

## 2018-11-23 NOTE — Telephone Encounter (Signed)
Returned the patient's call regarding her appt for today. Left a message explaining that the appt was for radiation, not the port placement and to be here by 12.

## 2018-11-23 NOTE — Progress Notes (Signed)
Based on PET, per Dr. Alycia Rossetti, recommendation is for chemoradiation with cisplatin.  At the completion of chemorads, recommend a repeat PET scan.  If persistent disease and patient tolerated above treatment, recommendation would be for chemotherapy with gemcitabine and cisplatin. In regards to the metabolic activity noted at th level of T11-12, pt has previous hx of back pain, surgery in 1985, and has seen a neurosurgeon.  Our office is going to reach out to the patient to obtain the records from her neurosurgeon. If no recent imaging/evaluation, thoracic spine MRI could be beneficial to provide baseline imaging.

## 2018-11-24 ENCOUNTER — Encounter: Payer: Self-pay | Admitting: Hematology and Oncology

## 2018-11-24 ENCOUNTER — Other Ambulatory Visit: Payer: Self-pay | Admitting: Radiology

## 2018-11-24 ENCOUNTER — Telehealth: Payer: Self-pay | Admitting: Oncology

## 2018-11-24 NOTE — Progress Notes (Signed)
Called patient to introduce myself as Arboriculturist and to offer available resources.  Discussed one-time $1000 Radio broadcast assistant to assist with personal expenses while going through treatment.Based on verbal household income, she does qualify.   She will bring proof of her income on Thurs 4/23.She was instructed to call me when she arrives and I will meet her out front. She verbalized understanding.

## 2018-11-24 NOTE — Telephone Encounter (Signed)
Called Jennifer Santos with [facility] and requested that CBC and BMP are added to labs before procedure tomorrow per Dr. Alvy Bimler.

## 2018-11-25 ENCOUNTER — Other Ambulatory Visit: Payer: Self-pay

## 2018-11-25 ENCOUNTER — Ambulatory Visit (HOSPITAL_COMMUNITY)
Admission: RE | Admit: 2018-11-25 | Discharge: 2018-11-25 | Disposition: A | Discharge status: Home / Self Care | Payer: BLUE CROSS/BLUE SHIELD | Source: Ambulatory Visit | Attending: Hematology and Oncology | Admitting: Hematology and Oncology

## 2018-11-25 ENCOUNTER — Encounter (HOSPITAL_COMMUNITY): Payer: Self-pay

## 2018-11-25 DIAGNOSIS — C531 Malignant neoplasm of exocervix: Secondary | ICD-10-CM | POA: Diagnosis present

## 2018-11-25 HISTORY — PX: IR IMAGING GUIDED PORT INSERTION: IMG5740

## 2018-11-25 LAB — CBC
HCT: 39 % (ref 36.0–46.0)
Hemoglobin: 13.2 g/dL (ref 12.0–15.0)
MCH: 29.5 pg (ref 26.0–34.0)
MCHC: 33.8 g/dL (ref 30.0–36.0)
MCV: 87.2 fL (ref 80.0–100.0)
Platelets: 337 10*3/uL (ref 150–400)
RBC: 4.47 MIL/uL (ref 3.87–5.11)
RDW: 12.1 % (ref 11.5–15.5)
WBC: 8.4 10*3/uL (ref 4.0–10.5)
nRBC: 0 % (ref 0.0–0.2)

## 2018-11-25 LAB — BASIC METABOLIC PANEL
Anion gap: 13 (ref 5–15)
BUN: 11 mg/dL (ref 6–20)
CO2: 25 mmol/L (ref 22–32)
Calcium: 9.3 mg/dL (ref 8.9–10.3)
Chloride: 98 mmol/L (ref 98–111)
Creatinine, Ser: 1.19 mg/dL — ABNORMAL HIGH (ref 0.44–1.00)
GFR calc Af Amer: >60 mL/min (ref >60)
GFR calc non Af Amer: 52 mL/min — ABNORMAL LOW (ref >60)
Glucose, Bld: 81 mg/dL (ref 70–99)
Potassium: 3.5 mmol/L (ref 3.5–5.1)
Sodium: 136 mmol/L (ref 135–145)

## 2018-11-25 LAB — PROTIME-INR
INR: 0.8 (ref 0.8–1.2)
Prothrombin Time: 11.1 seconds — ABNORMAL LOW (ref 11.4–15.2)

## 2018-11-25 MED ORDER — FENTANYL CITRATE (PF) 100 MCG/2ML IJ SOLN
INTRAMUSCULAR | Status: AC
  Filled 2018-11-25: qty 2

## 2018-11-25 MED ORDER — LIDOCAINE HCL 1 % IJ SOLN
INTRAMUSCULAR | Status: AC
  Administered 2018-11-25: 10:00:00 15 mL
  Filled 2018-11-25: qty 20

## 2018-11-25 MED ORDER — SODIUM CHLORIDE 0.9 % IV SOLN: INTRAVENOUS | Status: DC

## 2018-11-25 MED ORDER — MIDAZOLAM HCL 2 MG/2ML IJ SOLN
INTRAMUSCULAR | Status: AC | PRN
  Administered 2018-11-25: 1 mg via INTRAVENOUS
  Administered 2018-11-25 (×2): 0.5 mg via INTRAVENOUS

## 2018-11-25 MED ORDER — CEFAZOLIN SODIUM-DEXTROSE 2-4 GM/100ML-% IV SOLN
2.0000 g | INTRAVENOUS | Status: AC
  Administered 2018-11-25: 2 g via INTRAVENOUS

## 2018-11-25 MED ORDER — MIDAZOLAM HCL 2 MG/2ML IJ SOLN
INTRAMUSCULAR | Status: AC
  Filled 2018-11-25: qty 2

## 2018-11-25 MED ORDER — CEFAZOLIN SODIUM-DEXTROSE 2-4 GM/100ML-% IV SOLN
INTRAVENOUS | Status: AC
  Filled 2018-11-25: qty 100

## 2018-11-25 MED ORDER — HEPARIN SOD (PORK) LOCK FLUSH 100 UNIT/ML IV SOLN
INTRAVENOUS | Status: AC
  Filled 2018-11-25: qty 5

## 2018-11-25 MED ORDER — FENTANYL CITRATE (PF) 100 MCG/2ML IJ SOLN
INTRAMUSCULAR | Status: AC | PRN
  Administered 2018-11-25 (×2): 25 ug via INTRAVENOUS
  Administered 2018-11-25: 50 ug via INTRAVENOUS

## 2018-11-25 NOTE — Discharge Instructions (Signed)
Implanted Port Insertion, Care After °This sheet gives you information about how to care for yourself after your procedure. Your health care provider may also give you more specific instructions. If you have problems or questions, contact your health care provider. °What can I expect after the procedure? °After the procedure, it is common to have: °· Discomfort at the port insertion site. °· Bruising on the skin over the port. This should improve over 3-4 days. °Follow these instructions at home: °Port care °· After your port is placed, you will get a manufacturer's information card. The card has information about your port. Keep this card with you at all times. °· Take care of the port as told by your health care provider. Ask your health care provider if you or a family member can get training for taking care of the port at home. A home health care nurse may also take care of the port. °· Make sure to remember what type of port you have. °Incision care ° °  ° °· Follow instructions from your health care provider about how to take care of your port insertion site. Make sure you: °? Wash your hands with soap and water before and after you change your bandage (dressing). If soap and water are not available, use hand sanitizer. °? Change your dressing as told by your health care provider. °? Leave stitches (sutures), skin glue, or adhesive strips in place. These skin closures may need to stay in place for 2 weeks or longer. If adhesive strip edges start to loosen and curl up, you may trim the loose edges. Do not remove adhesive strips completely unless your health care provider tells you to do that. °· Check your port insertion site every day for signs of infection. Check for: °? Redness, swelling, or pain. °? Fluid or blood. °? Warmth. °? Pus or a bad smell. °Activity °· Return to your normal activities as told by your health care provider. Ask your health care provider what activities are safe for you. °· Do not  lift anything that is heavier than 10 lb (4.5 kg), or the limit that you are told, until your health care provider says that it is safe. °General instructions °· Take over-the-counter and prescription medicines only as told by your health care provider. °· Do not take baths, swim, or use a hot tub until your health care provider approves. Ask your health care provider if you may take showers. You may only be allowed to take sponge baths. °· Do not drive for 24 hours if you were given a sedative during your procedure. °· Wear a medical alert bracelet in case of an emergency. This will tell any health care providers that you have a port. °· Keep all follow-up visits as told by your health care provider. This is important. °Contact a health care provider if: °· You cannot flush your port with saline as directed, or you cannot draw blood from the port. °· You have a fever or chills. °· You have redness, swelling, or pain around your port insertion site. °· You have fluid or blood coming from your port insertion site. °· Your port insertion site feels warm to the touch. °· You have pus or a bad smell coming from the port insertion site. °Get help right away if: °· You have chest pain or shortness of breath. °· You have bleeding from your port that you cannot control. °Summary °· Take care of the port as told by your health   care provider. Keep the manufacturer's information card with you at all times. °· Change your dressing as told by your health care provider. °· Contact a health care provider if you have a fever or chills or if you have redness, swelling, or pain around your port insertion site. °· Keep all follow-up visits as told by your health care provider. °This information is not intended to replace advice given to you by your health care provider. Make sure you discuss any questions you have with your health care provider. °Document Released: 05/12/2013 Document Revised: 02/17/2018 Document Reviewed:  02/17/2018 °Elsevier Interactive Patient Education © 2019 [employer] ° °Moderate Conscious Sedation, Adult, Care After °These instructions provide you with information about caring for yourself after your procedure. Your health care provider may also give you more specific instructions. Your treatment has been planned according to current medical practices, but problems sometimes occur. Call your health care provider if you have any problems or questions after your procedure. °What can I expect after the procedure? °After your procedure, it is common: °· To feel sleepy for several hours. °· To feel clumsy and have poor balance for several hours. °· To have poor judgment for several hours. °· To vomit if you eat too soon. °Follow these instructions at home: °For at least 24 hours after the procedure: ° °· Do not: °? Participate in activities where you could fall or become injured. °? Drive. °? Use heavy machinery. °? Drink alcohol. °? Take sleeping pills or medicines that cause drowsiness. °? Make important decisions or sign legal documents. °? Take care of children on your own. °· Rest. °Eating and drinking °· Follow the diet recommended by your health care provider. °· If you vomit: °? Drink water, juice, or soup when you can drink without vomiting. °? Make sure you have little or no nausea before eating solid foods. °General instructions °· Have a responsible adult stay with you until you are awake and alert. °· Take over-the-counter and prescription medicines only as told by your health care provider. °· If you smoke, do not smoke without supervision. °· Keep all follow-up visits as told by your health care provider. This is important. °Contact a health care provider if: °· You keep feeling nauseous or you keep vomiting. °· You feel light-headed. °· You develop a rash. °· You have a fever. °Get help right away if: °· You have trouble breathing. °This information is not intended to replace advice given to you  by your health care provider. Make sure you discuss any questions you have with your health care provider. °Document Released: 05/12/2013 Document Revised: 12/25/2015 Document Reviewed: 11/11/2015 °Elsevier Interactive Patient Education © 2019 [employer] ° °

## 2018-11-25 NOTE — Procedures (Signed)
Interventional Radiology Procedure Note  Procedure: Single Lumen Power Port Placement    Access:  Right IJ vein.  Findings: Catheter tip positioned at SVC/RA junction. Port is ready for immediate use.   Complications: None  EBL: < 10 mL  Recommendations:  - Ok to shower in 24 hours - Do not submerge for 7 days - Routine line care   Royelle Hinchman T. Malik Ruffino, M.D Pager:  [phone]   

## 2018-11-25 NOTE — Sedation Documentation (Signed)
Discharge AVS instructions reviewed with patient. Patient states understanding of discharge instructions without any further questions. Copy of AVS and Power [employer] given to patient.

## 2018-11-25 NOTE — H&P (Signed)
Chief Complaint: Patient was seen in consultation today for cervical cancer/Port-a-cath placement.  Referring Physician(s): Heath Lark  Supervising Physician: Aletta Edouard  Patient Status: [facility] - Out-pt  History of Present Illness: Jennifer Santos is a 53 y.o. female with a past medical history of cervical cancer. She was unfortunately diagnosed with cervical cancer 11/2018. Her cancer is managed by Dr. Alvy Bimler. She has tentative plans to begin systemic chemotherapy.  IR requested by Dr. Alvy Bimler for possible image-guided Port-a-cath placement. Patient awake and alert laying in bed with no complaints at this time. Denies fever, chills, chest pain, dyspnea, abdominal pain, or headache.   Past Medical History:  Diagnosis Date   #[id number] dx'd 10/20/18    Past Surgical History:  Procedure Laterality Date   BACK SURGERY  1985   Removal of superficial mass   TUBAL LIGATION      Allergies: Patient has no known allergies.  Medications: Prior to Admission medications   Medication Sig Start Date End Date Taking? Authorizing Provider  acetaminophen (TYLENOL) 500 MG tablet Take 1,000 mg by mouth every 6 (six) hours as needed (pain).   Yes [provider]  ibuprofen (ADVIL) 800 MG tablet Take 200 mg by mouth every 6 (six) hours as needed.   Yes [provider]  tiZANidine (ZANAFLEX) 2 MG tablet Take 2 mg by mouth at bedtime as needed.  11/03/18  Yes [provider]  lidocaine-prilocaine (EMLA) cream Apply to affected area once Patient taking differently: Apply 1 application topically daily as needed (local anesthetic). Apply to affected area once 11/18/18   Heath Lark, MD  ondansetron (ZOFRAN) 8 MG tablet Take 1 tablet (8 mg total) by mouth every 8 (eight) hours as needed. Start on the third day after chemotherapy. 11/18/18   Heath Lark, MD  prochlorperazine (COMPAZINE) 10 MG tablet Take 1 tablet (10 mg total) by mouth every 6 (six) hours as needed (Nausea or  vomiting). 11/18/18   Heath Lark, MD     Family History  Problem Relation Age of Onset   Cancer Father    Brain cancer Father    Lung cancer Father     Social History   Socioeconomic History   Marital status: Legally Separated    Spouse name: Not on file   Number of children: 2   Years of education: Not on file   Highest education level: Not on file  Occupational History   Occupation: maintenance  Social Needs   Financial resource strain: Not on file   Food insecurity:    Worry: Not on file    Inability: Not on file   Transportation needs:    Medical: Not on file    Non-medical: Not on file  Tobacco Use   Smoking status: Current Every Day Smoker    Packs/day: 0.50    Types: Cigarettes   Smokeless tobacco: Never Used  Substance and Sexual Activity   Alcohol use: Yes    Comment: Social    Drug use: Yes    Types: Marijuana, Cocaine   Sexual activity: Not Currently  Lifestyle   Physical activity:    Days per week: Not on file    Minutes per session: Not on file   Stress: Not on file  Relationships   Social connections:    Talks on phone: Not on file    Gets together: Not on file    Attends religious service: Not on file    Active member of club or organization: Not on file  Attends meetings of clubs or organizations: Not on file    Relationship status: Not on file  Other Topics Concern   Not on file  Social History Narrative   AJ, oldest son is MPOA   Edison Nasuti is 3 and lives with patient     Review of Systems: A 12 point ROS discussed and pertinent positives are indicated in the HPI above.  All other systems are negative.  Review of Systems  Constitutional: Negative for chills and fever.  Respiratory: Negative for shortness of breath and wheezing.   Cardiovascular: Negative for chest pain and palpitations.  Gastrointestinal: Negative for abdominal pain.  Neurological: Negative for headaches.  Psychiatric/Behavioral: Negative for  behavioral problems and confusion.    Vital Signs: BP (!) 143/88    Pulse 76    Temp (!) 97.5 F (36.4 C) (Oral)    Resp 16    Ht 5' 6.5" (1.689 m)    Wt 125 lb (56.7 kg)    SpO2 100%    BMI 19.87 kg/m   Physical Exam Vitals signs and nursing note reviewed.  Constitutional:      General: She is not in acute distress.    Appearance: Normal appearance.  Cardiovascular:     Rate and Rhythm: Normal rate and regular rhythm.     Heart sounds: Normal heart sounds. No murmur.  Pulmonary:     Effort: Pulmonary effort is normal. No respiratory distress.     Breath sounds: Normal breath sounds. No wheezing.  Skin:    General: Skin is warm and dry.  Neurological:     Mental Status: She is alert and oriented to person, place, and time.  Psychiatric:        Mood and Affect: Mood normal.        Behavior: Behavior normal.        Thought Content: Thought content normal.        Judgment: Judgment normal.      MD Evaluation Airway: WNL Heart: WNL Abdomen: WNL Chest/ Lungs: WNL ASA  Classification: 2 Mallampati/Airway Score: Two   Imaging: Ct Chest W Contrast  Result Date: 11/17/2018 CLINICAL DATA:  Cervical cancer. EXAM: CT CHEST, ABDOMEN, AND PELVIS WITH CONTRAST TECHNIQUE: Multidetector CT imaging of the chest, abdomen and pelvis was performed following the standard protocol during bolus administration of intravenous contrast. CONTRAST:  48mL OMNIPAQUE IOHEXOL 300 MG/ML  SOLN COMPARISON:  None. FINDINGS: CT CHEST FINDINGS Cardiovascular: The heart size is normal. No substantial pericardial effusion. Mediastinum/Nodes: No mediastinal lymphadenopathy. There is no hilar lymphadenopathy. The esophagus has normal imaging features. There is no axillary lymphadenopathy. Lungs/Pleura: The central tracheobronchial airways are patent. Biapical pleuroparenchymal scarring evident. No suspicious nodule or mass. No pleural effusion. Musculoskeletal: No worrisome lytic or sclerotic osseous abnormality.  Superior endplate compression fracture at T12 appears nonacute. CT ABDOMEN PELVIS FINDINGS Hepatobiliary: No suspicious focal abnormality within the liver parenchyma. There is no evidence for gallstones, gallbladder wall thickening, or pericholecystic fluid. No intrahepatic or extrahepatic biliary dilation. Pancreas: No focal mass lesion. No dilatation of the main duct. No intraparenchymal cyst. No peripancreatic edema. Spleen: No splenomegaly. No focal mass lesion. Adrenals/Urinary Tract: No adrenal nodule or mass. Moderate right hydroureteronephrosis is identified with dilated right ureter down into the pelvis. Left kidney and ureter unremarkable. The urinary bladder appears normal for the degree of distention. Stomach/Bowel: Stomach is unremarkable. No gastric wall thickening. No evidence of outlet obstruction. Duodenum is normally positioned as is the ligament of Treitz. No small bowel wall  thickening. No small bowel dilatation. The terminal ileum is normal. No gross colonic mass. No colonic wall thickening. Vascular/Lymphatic: There is abdominal aortic atherosclerosis without aneurysm. No gastrohepatic or hepato duodenal ligament lymphadenopathy. Abnormal soft tissue is seen around the distal aorta involving the IMA origin (see 73/2). This amorphous abnormal soft tissue extends down along the iliac arteries bilaterally. Reproductive: The endometrial cavity of the uterus is fluid-filled and markedly distended, consistent with cervical obstruction. The cervix is heterogeneous and appears enlarged. Extending posterolaterally to the right from the cervical tissue is a large 6.6 x 3.0 cm necrotic irregular soft tissue mass extending into the right presacral space. The upper vaginal wall thickening evident. Other: Trace free fluid noted in the pelvis. There is presacral edema evident. Musculoskeletal: No worrisome lytic or sclerotic osseous abnormality. IMPRESSION: 1. Irregular enlargement of the cervix with  extension of soft tissue from the cervix into the posterior presacral space along the side of the rectum. Wall thickening in the upper vagina is compatible with tumor involvement. The endometrial cavity of the uterus is markedly expanded and fluid-filled consistent with obstruction. No definite involvement of the posterior bladder wall evident by CT. 2. Moderate right hydroureteronephrosis with ureteral dilatation extending down to the right pelvic sidewall adjacent to the cervix. 3. Abnormal, amorphous soft tissue surrounds the distal aorta and common iliac arteries. Imaging appearance concerning for tumor spread. 4. No evidence for metastatic disease in the chest. 5.  Aortic Atherosclerois (ICD10-170.0) Electronically Signed   By: Misty Stanley M.D.   On: 11/17/2018 16:34   Ct Abdomen Pelvis W Contrast  Result Date: 11/17/2018 CLINICAL DATA:  Cervical cancer. EXAM: CT CHEST, ABDOMEN, AND PELVIS WITH CONTRAST TECHNIQUE: Multidetector CT imaging of the chest, abdomen and pelvis was performed following the standard protocol during bolus administration of intravenous contrast. CONTRAST:  72mL OMNIPAQUE IOHEXOL 300 MG/ML  SOLN COMPARISON:  None. FINDINGS: CT CHEST FINDINGS Cardiovascular: The heart size is normal. No substantial pericardial effusion. Mediastinum/Nodes: No mediastinal lymphadenopathy. There is no hilar lymphadenopathy. The esophagus has normal imaging features. There is no axillary lymphadenopathy. Lungs/Pleura: The central tracheobronchial airways are patent. Biapical pleuroparenchymal scarring evident. No suspicious nodule or mass. No pleural effusion. Musculoskeletal: No worrisome lytic or sclerotic osseous abnormality. Superior endplate compression fracture at T12 appears nonacute. CT ABDOMEN PELVIS FINDINGS Hepatobiliary: No suspicious focal abnormality within the liver parenchyma. There is no evidence for gallstones, gallbladder wall thickening, or pericholecystic fluid. No intrahepatic or  extrahepatic biliary dilation. Pancreas: No focal mass lesion. No dilatation of the main duct. No intraparenchymal cyst. No peripancreatic edema. Spleen: No splenomegaly. No focal mass lesion. Adrenals/Urinary Tract: No adrenal nodule or mass. Moderate right hydroureteronephrosis is identified with dilated right ureter down into the pelvis. Left kidney and ureter unremarkable. The urinary bladder appears normal for the degree of distention. Stomach/Bowel: Stomach is unremarkable. No gastric wall thickening. No evidence of outlet obstruction. Duodenum is normally positioned as is the ligament of Treitz. No small bowel wall thickening. No small bowel dilatation. The terminal ileum is normal. No gross colonic mass. No colonic wall thickening. Vascular/Lymphatic: There is abdominal aortic atherosclerosis without aneurysm. No gastrohepatic or hepato duodenal ligament lymphadenopathy. Abnormal soft tissue is seen around the distal aorta involving the IMA origin (see 73/2). This amorphous abnormal soft tissue extends down along the iliac arteries bilaterally. Reproductive: The endometrial cavity of the uterus is fluid-filled and markedly distended, consistent with cervical obstruction. The cervix is heterogeneous and appears enlarged. Extending posterolaterally to the right from the  cervical tissue is a large 6.6 x 3.0 cm necrotic irregular soft tissue mass extending into the right presacral space. The upper vaginal wall thickening evident. Other: Trace free fluid noted in the pelvis. There is presacral edema evident. Musculoskeletal: No worrisome lytic or sclerotic osseous abnormality. IMPRESSION: 1. Irregular enlargement of the cervix with extension of soft tissue from the cervix into the posterior presacral space along the side of the rectum. Wall thickening in the upper vagina is compatible with tumor involvement. The endometrial cavity of the uterus is markedly expanded and fluid-filled consistent with obstruction. No  definite involvement of the posterior bladder wall evident by CT. 2. Moderate right hydroureteronephrosis with ureteral dilatation extending down to the right pelvic sidewall adjacent to the cervix. 3. Abnormal, amorphous soft tissue surrounds the distal aorta and common iliac arteries. Imaging appearance concerning for tumor spread. 4. No evidence for metastatic disease in the chest. 5.  Aortic Atherosclerois (ICD10-170.0) Electronically Signed   By: Misty Stanley M.D.   On: 11/17/2018 16:34   Nm Pet Image Initial (pi) Skull Base To Thigh  Result Date: 11/20/2018 CLINICAL DATA:  Initial treatment strategy for cervical cancer. EXAM: NUCLEAR MEDICINE PET SKULL BASE TO THIGH TECHNIQUE: 6.2 mCi F-18 FDG was injected intravenously. Full-ring PET imaging was performed from the skull base to thigh after the radiotracer. CT data was obtained and used for attenuation correction and anatomic localization. Fasting blood glucose: 94 mg/dl COMPARISON:  CT abdomen/pelvis 11/17/2018 FINDINGS: Mediastinal blood pool activity: SUV max 2.1 NECK: No hypermetabolic lymph nodes in the neck. Areas of FDG uptake identified in the maxilla and mandible, potentially related to periodontal disease. Incidental CT findings: none CHEST: 7 mm short axis AP window lymph node (59/4) shows low level uptake with SUV max = 4.2. Foci of hypermetabolic FDG accumulation are identified in both hilar regions without underlying lymph nodes evident on noncontrast CT imaging. SUV max = 3.6 right hilum. Incidental CT findings: Biapical pleuroparenchymal scarring evident. No suspicious pulmonary nodule or mass. No pleural effusion. ABDOMEN/PELVIS: No abnormal hypermetabolic activity within the liver, pancreas, adrenal glands, or spleen. The abnormal para-aortic soft tissue seen just proximal to the bifurcation on the previous exam is hypermetabolic with SUV max = 4.5. 8 mm short axis left pelvic sidewall lymph node (767/3) is hypermetabolic with SUV max =  6.2. 7 x 11 mm nodule posterior right pelvis (419/3) is hypermetabolic with SUV max = 4.3. XR cervical direct extension of tumor posterolaterally on the right is seen previously is hypermetabolic with SUV max = 79.0. Incidental CT findings: Similar appearance milder it right hydroureteronephrosis with ureteral obstruction in the region of the cervical lesion. Asymmetric bladder wall thickening posteriorly on the right (167/4) is more prominent on the current study than previously. Radiotracer in the bladder urine obscures assessment of the bladder wall. SKELETON: No focal hypermetabolic activity to suggest skeletal metastasis. Focal hypermetabolism identified in the spinal canal at the level of T11-12 without underlying lesion evident on noncontrast CT. SUV max = 3.2. Incidental CT findings: none IMPRESSION: 1. Cervical lesion with direct extra cervical extension posterolaterally on the right is markedly hypermetabolic consistent with known neoplasm. 2. 7 x 11 mm posterior right pelvic irregular nodule is hypermetabolic consistent with metastatic disease. 3. Para-aortic soft tissue in the region of the bifurcation and 8 mm short axis left pelvic sidewall lymph node show marked hypermetabolism, consistent with metastatic involvement. 4. Low level FDG uptake identified in a unenlarged AP window lymph node of the mediastinum with  focal FDG uptake identified in each hilum. While no underlying hilar lymph nodes evident on this noncontrast CT exam, tiny lymph nodes were visible in the hilar regions on diagnostic CT of 11/17/2018. While this may be reactive given the low level uptake, metastatic disease cannot be definitively excluded. 5. Focus of hypermetabolic activity in the spinal canal at the T11-12 level. No underlying mass lesion evident on noncontrast CT imaging. Thoracic spine MRI without and with contrast may be warranted to further evaluate. 6. Stable moderate right hydroureteronephrosis. Electronically Signed    By: Misty Stanley M.D.   On: 11/20/2018 17:24    Labs:  CBC: Recent Labs    09/01/18 1155 11/18/18 1140  WBC 6.5 7.6  HGB 14.8 13.4  HCT 44.2 40.8  PLT 269 264    COAGS: Recent Labs    11/25/18 0707  INR 0.8    BMP: Recent Labs    09/01/18 1155 11/18/18 1140 11/25/18 0707  NA 135 139 136  K 3.4* 4.5 3.5  CL 101 101 98  CO2 26 24 25   GLUCOSE 113* 92 81  BUN 10 14 11   CALCIUM 9.2 9.3 9.3  CREATININE 0.75 1.00 1.19*  GFRNONAA >60 >60 52*  GFRAA >60 >60 >60    LIVER FUNCTION TESTS: Recent Labs    09/01/18 1155 11/18/18 1140  BILITOT 0.5 <0.2*  AST 20 17  ALT 13 13  ALKPHOS 74 81  PROT 7.8 7.6  ALBUMIN 4.4 4.0     Assessment and Plan:  Cervical cancer. Plan for image-guided Port-a-cath placement today with Dr. Kathlene Cote. Patient is NPO. Afebrile. She does not take blood thinners. INR 0.8 seconds today.  Risks and benefits of image guided port-a-catheter placement was discussed with the patient including, but not limited to bleeding, infection, pneumothorax, or fibrin sheath development and need for additional procedures. All of the patient's questions were answered, patient is agreeable to proceed. Consent signed and in chart.   Thank you for this interesting consult.  I greatly enjoyed meeting Shalena Ezzell and look forward to participating in their care.  A copy of this report was sent to the requesting provider on this date.  Electronically Signed: Earley Abide, PA-C 11/25/2018, 7:52 AM   I spent a total of 40 Minutes in face to face in clinical consultation, greater than 50% of which was counseling/coordinating care for cervical cancer/Port-a-cath placement.

## 2018-11-26 ENCOUNTER — Telehealth: Payer: Self-pay | Admitting: Hematology and Oncology

## 2018-11-26 ENCOUNTER — Telehealth: Payer: Self-pay | Admitting: Oncology

## 2018-11-26 ENCOUNTER — Ambulatory Visit (HOSPITAL_COMMUNITY)
Admission: RE | Admit: 2018-11-26 | Discharge: 2018-11-26 | Disposition: A | Discharge status: Home / Self Care | Payer: BLUE CROSS/BLUE SHIELD | Source: Ambulatory Visit | Attending: Gynecologic Oncology | Admitting: Gynecologic Oncology

## 2018-11-26 DIAGNOSIS — G8929 Other chronic pain: Secondary | ICD-10-CM | POA: Insufficient documentation

## 2018-11-26 DIAGNOSIS — C531 Malignant neoplasm of exocervix: Secondary | ICD-10-CM | POA: Insufficient documentation

## 2018-11-26 DIAGNOSIS — M549 Dorsalgia, unspecified: Secondary | ICD-10-CM | POA: Insufficient documentation

## 2018-11-26 DIAGNOSIS — R948 Abnormal results of function studies of other organs and systems: Secondary | ICD-10-CM | POA: Insufficient documentation

## 2018-11-26 MED ORDER — GADOBUTROL 1 MMOL/ML IV SOLN
5.0000 mL | Freq: Once | INTRAVENOUS | Status: AC | PRN
  Administered 2018-11-26: 12:00:00 5 mL via INTRAVENOUS

## 2018-11-26 NOTE — Telephone Encounter (Signed)
Called Jennifer Santos and advised her of the MRI thoracic spine results per Joylene John, NP.  She verbalized agreement and understanding.

## 2018-11-26 NOTE — Telephone Encounter (Signed)
Scheduled appt per sch message - pt is aware of treatment tomorrow 4/24 and will pick up an updated schedule when she comes in .

## 2018-11-27 ENCOUNTER — Telehealth: Payer: Self-pay

## 2018-11-27 ENCOUNTER — Inpatient Hospital Stay: Payer: BLUE CROSS/BLUE SHIELD

## 2018-11-27 ENCOUNTER — Other Ambulatory Visit: Payer: Self-pay

## 2018-11-27 VITALS — BP 146/98 | HR 89 | Temp 98.0°F | Resp 16

## 2018-11-27 DIAGNOSIS — Z510 Encounter for antineoplastic radiation therapy: Secondary | ICD-10-CM | POA: Diagnosis not present

## 2018-11-27 DIAGNOSIS — C531 Malignant neoplasm of exocervix: Secondary | ICD-10-CM

## 2018-11-27 MED ORDER — POTASSIUM CHLORIDE 2 MEQ/ML IV SOLN
Freq: Once | INTRAVENOUS | Status: AC
  Administered 2018-11-27: 09:00:00 via INTRAVENOUS
  Filled 2018-11-27: qty 10

## 2018-11-27 MED ORDER — PALONOSETRON HCL INJECTION 0.25 MG/5ML
INTRAVENOUS | Status: AC
  Filled 2018-11-27: qty 5

## 2018-11-27 MED ORDER — SODIUM CHLORIDE 0.9 % IV SOLN
40.0000 mg/m2 | Freq: Once | INTRAVENOUS | Status: AC
  Administered 2018-11-27: 12:00:00 66 mg via INTRAVENOUS
  Filled 2018-11-27: qty 66

## 2018-11-27 MED ORDER — OXYCODONE-ACETAMINOPHEN 5-325 MG PO TABS
ORAL_TABLET | ORAL | Status: AC
  Filled 2018-11-27: qty 1

## 2018-11-27 MED ORDER — PALONOSETRON HCL INJECTION 0.25 MG/5ML
0.2500 mg | Freq: Once | INTRAVENOUS | Status: AC
  Administered 2018-11-27: 0.25 mg via INTRAVENOUS

## 2018-11-27 MED ORDER — OXYCODONE-ACETAMINOPHEN 5-325 MG PO TABS
1.0000 | ORAL_TABLET | Freq: Once | ORAL | Status: AC
  Administered 2018-11-27: 11:00:00 1 via ORAL

## 2018-11-27 MED ORDER — SODIUM CHLORIDE 0.9 % IV SOLN
Freq: Once | INTRAVENOUS | Status: AC
  Administered 2018-11-27: 11:00:00 via INTRAVENOUS
  Filled 2018-11-27: qty 5

## 2018-11-27 MED ORDER — SODIUM CHLORIDE 0.9 % IV SOLN
Freq: Once | INTRAVENOUS | Status: AC
  Administered 2018-11-27: 09:00:00 via INTRAVENOUS
  Filled 2018-11-27: qty 250

## 2018-11-27 MED ORDER — HEPARIN SOD (PORK) LOCK FLUSH 100 UNIT/ML IV SOLN
500.0000 [IU] | Freq: Once | INTRAVENOUS | Status: AC | PRN
  Administered 2018-11-27: 15:00:00 500 [IU]
  Filled 2018-11-27: qty 5

## 2018-11-27 MED ORDER — SODIUM CHLORIDE 0.9% FLUSH
10.0000 mL | INTRAVENOUS | Status: DC | PRN
  Administered 2018-11-27: 15:00:00 10 mL
  Filled 2018-11-27: qty 10

## 2018-11-27 NOTE — Telephone Encounter (Signed)
Called and left a message asking her to call the office back. 

## 2018-11-27 NOTE — Telephone Encounter (Signed)
-----   Message from Heath Lark, MD sent at 11/27/2018  7:39 AM EDT ----- Regarding: MRI spine showed no mets, just old fracture  ----- Message ----- From: Dorothyann Gibbs, NP Sent: 11/26/2018   4:09 PM EDT To: Gery Pray, MD, Heath Lark, MD

## 2018-11-27 NOTE — Patient Instructions (Addendum)
[facility] Discharge Instructions for Patients Receiving Chemotherapy  Today you received the following chemotherapy agents Cisplatin  To help prevent nausea and vomiting after your treatment, we encourage you to take your nausea medication as directed If you develop nausea and vomiting that is not controlled by your nausea medication, call the clinic.   BELOW ARE SYMPTOMS THAT SHOULD BE REPORTED IMMEDIATELY:  *FEVER GREATER THAN 100.5 F  *CHILLS WITH OR WITHOUT FEVER  NAUSEA AND VOMITING THAT IS NOT CONTROLLED WITH YOUR NAUSEA MEDICATION  *UNUSUAL SHORTNESS OF BREATH  *UNUSUAL BRUISING OR BLEEDING  TENDERNESS IN MOUTH AND THROAT WITH OR WITHOUT PRESENCE OF ULCERS  *URINARY PROBLEMS  *BOWEL PROBLEMS  UNUSUAL RASH Items with * indicate a potential emergency and should be followed up as soon as possible.  Feel free to call the clinic should you have any questions or concerns. The clinic phone number is [phone].  Please show the CHEMO ALERT CARD at check-in to the Emergency Department and triage nurse.   Cisplatin injection What is this medicine? CISPLATIN (SIS pla tin) is a chemotherapy drug. It targets fast dividing cells, like cancer cells, and causes these cells to die. This medicine is used to treat many types of cancer like bladder, ovarian, and testicular cancers. This medicine may be used for other purposes; ask your health care provider or pharmacist if you have questions. COMMON BRAND NAME(S): Platinol, Platinol -AQ What should I tell my health care provider before I take this medicine? They need to know if you have any of these conditions: -blood disorders -hearing problems -kidney disease -recent or ongoing radiation therapy -an unusual or allergic reaction to cisplatin, carboplatin, other chemotherapy, other medicines, foods, dyes, or preservatives -pregnant or trying to get pregnant -breast-feeding How should I use this medicine? This  drug is given as an infusion into a vein. It is administered in a hospital or clinic by a specially trained health care professional. Talk to your pediatrician regarding the use of this medicine in children. Special care may be needed. Overdosage: If you think you have taken too much of this medicine contact a poison control center or emergency room at once. NOTE: This medicine is only for you. Do not share this medicine with others. What if I miss a dose? It is important not to miss a dose. Call your doctor or health care professional if you are unable to keep an appointment. What may interact with this medicine? -dofetilide -foscarnet -medicines for seizures -medicines to increase blood counts like filgrastim, pegfilgrastim, sargramostim -probenecid -pyridoxine used with altretamine -rituximab -some antibiotics like amikacin, gentamicin, neomycin, polymyxin B, streptomycin, tobramycin -sulfinpyrazone -vaccines -zalcitabine Talk to your doctor or health care professional before taking any of these medicines: -acetaminophen -aspirin -ibuprofen -ketoprofen -naproxen This list may not describe all possible interactions. Give your health care provider a list of all the medicines, herbs, non-prescription drugs, or dietary supplements you use. Also tell them if you smoke, drink alcohol, or use illegal drugs. Some items may interact with your medicine. What should I watch for while using this medicine? Your condition will be monitored carefully while you are receiving this medicine. You will need important blood work done while you are taking this medicine. This drug may make you feel generally unwell. This is not uncommon, as chemotherapy can affect healthy cells as well as cancer cells. Report any side effects. Continue your course of treatment even though you feel ill unless your doctor tells you to stop. In   some cases, you may be given additional medicines to help with side effects. Follow  all directions for their use. Call your doctor or health care professional for advice if you get a fever, chills or sore throat, or other symptoms of a cold or flu. Do not treat yourself. This drug decreases your body's ability to fight infections. Try to avoid being around people who are sick. This medicine may increase your risk to bruise or bleed. Call your doctor or health care professional if you notice any unusual bleeding. Be careful brushing and flossing your teeth or using a toothpick because you may get an infection or bleed more easily. If you have any dental work done, tell your dentist you are receiving this medicine. Avoid taking products that contain aspirin, acetaminophen, ibuprofen, naproxen, or ketoprofen unless instructed by your doctor. These medicines may hide a fever. Do not become pregnant while taking this medicine. Women should inform their doctor if they wish to become pregnant or think they might be pregnant. There is a potential for serious side effects to an unborn child. Talk to your health care professional or pharmacist for more information. Do not breast-feed an infant while taking this medicine. Drink fluids as directed while you are taking this medicine. This will help protect your kidneys. Call your doctor or health care professional if you get diarrhea. Do not treat yourself. What side effects may I notice from receiving this medicine? Side effects that you should report to your doctor or health care professional as soon as possible: -allergic reactions like skin rash, itching or hives, swelling of the face, lips, or tongue -signs of infection - fever or chills, cough, sore throat, pain or difficulty passing urine -signs of decreased platelets or bleeding - bruising, pinpoint red spots on the skin, black, tarry stools, nosebleeds -signs of decreased red blood cells - unusually weak or tired, fainting spells, lightheadedness -breathing problems -changes in  hearing -gout pain -low blood counts - This drug may decrease the number of white blood cells, red blood cells and platelets. You may be at increased risk for infections and bleeding. -nausea and vomiting -pain, swelling, redness or irritation at the injection site -pain, tingling, numbness in the hands or feet -problems with balance, movement -trouble passing urine or change in the amount of urine Side effects that usually do not require medical attention (report to your doctor or health care professional if they continue or are bothersome): -changes in vision -loss of appetite -metallic taste in the mouth or changes in taste This list may not describe all possible side effects. Call your doctor for medical advice about side effects. You may report side effects to FDA at 1-800-FDA-1088. Where should I keep my medicine? This drug is given in a hospital or clinic and will not be stored at home. NOTE: This sheet is a summary. It may not cover all possible information. If you have questions about this medicine, talk to your doctor, pharmacist, or health care provider.  2019 Elsevier/Gold Standard (2007-10-27 14:40:54)   

## 2018-11-27 NOTE — Progress Notes (Signed)
Dr. Alvy Bimler advised ok to treat with elevated BP. Also, Dr. Alvy Bimler advised ok to proceed with labs from 11/18/2018.

## 2018-11-27 NOTE — Telephone Encounter (Signed)
Called and given below message to Tim, RN who will give to patient.

## 2018-11-29 DIAGNOSIS — Z510 Encounter for antineoplastic radiation therapy: Secondary | ICD-10-CM | POA: Diagnosis not present

## 2018-11-30 ENCOUNTER — Other Ambulatory Visit: Payer: Self-pay

## 2018-11-30 ENCOUNTER — Encounter: Payer: Self-pay | Admitting: Hematology and Oncology

## 2018-11-30 ENCOUNTER — Telehealth: Payer: Self-pay

## 2018-11-30 ENCOUNTER — Ambulatory Visit
Admission: RE | Admit: 2018-11-30 | Discharge: 2018-11-30 | Disposition: A | Discharge status: Home / Self Care | Payer: BLUE CROSS/BLUE SHIELD | Source: Ambulatory Visit | Attending: Radiation Oncology | Admitting: Radiation Oncology

## 2018-11-30 ENCOUNTER — Encounter: Payer: Self-pay | Admitting: Oncology

## 2018-11-30 DIAGNOSIS — Z510 Encounter for antineoplastic radiation therapy: Secondary | ICD-10-CM | POA: Diagnosis not present

## 2018-11-30 DIAGNOSIS — C531 Malignant neoplasm of exocervix: Secondary | ICD-10-CM

## 2018-11-30 NOTE — Progress Notes (Signed)
  Radiation Oncology         [phone] ________________________________  Name: Jennifer Santos MRN: [id number]  Date: 11/30/2018  DOB: 01-Jul-1966  Simulation Verification Note    ICD-10-CM   1. Malignant neoplasm of exocervix ([place]) C53.1     Status: outpatient  NARRATIVE: The patient was brought to the treatment unit and placed in the planned treatment position. The clinical setup was verified. Then port films were obtained and uploaded to the radiation oncology medical record software.  The treatment beams were carefully compared against the planned radiation fields. The position location and shape of the radiation fields was reviewed. They targeted volume of tissue appears to be appropriately covered by the radiation beams. Organs at risk appear to be excluded as planned.  Based on my personal review, I approved the simulation verification. The patient's treatment will proceed as planned.  -----------------------------------  Blair Promise, PhD, MD

## 2018-11-30 NOTE — Telephone Encounter (Signed)
-----   Message from Sinda Du, RN sent at 11/27/2018 10:59 AM EDT ----- Regarding: Dr. Alvy Bimler - 1st chemo f/u 1st chemo f/u

## 2018-11-30 NOTE — Telephone Encounter (Signed)
Spoke with Jennifer Santos in the waiting area prior to radiation appt. She is doing well, with no complaints. Instructed to call the office back if needed. She verbalized understanding. Given paper work back that she left with the nurse on Friday in the infusion room for social security disability. Called her Friday and told that the office does not fill out that type of paper work. She verbalized understanding.

## 2018-11-30 NOTE — Progress Notes (Signed)
Gynecologic Oncology Multi-Disciplinary Disposition Conference Note  Date of the Conference: 11/30/2018  Patient Name: Jennifer Santos  Referring Provider: Dr. Kenton Kingfisher Primary GYN Oncologist: Dr. 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 Followed by a repeat PET scan.  If persistent disease and patient tolerated above treatment, recommendation would be for chemotherapy with gemcitabine and cisplatin.   This Multidisciplinary conference took place involving physicians from [place], [place], Radiation Oncology, Pathology, Radiology along with the Gynecologic Oncology Nurse Practitioner and RN.  Comprehensive assessment of the patient's malignancy, staging, need for surgery, chemotherapy, radiation therapy, and need for further testing were reviewed. Supportive measures, both inpatient and following discharge were also discussed. The recommended plan of care is documented. Greater than 35 minutes were spent correlating and coordinating this patient's care.

## 2018-11-30 NOTE — Progress Notes (Signed)
Applied for copay assistance through [employer] on behalf of patient for available funds.  Patient approved for guaranteed amount of $2500 11/30/18 - 11/30/19 with a lookback date of 06/03/18. She will receive a copy of the approval letter in the mail.  Called patient to advise of approval and how to use at the pharmacy if needed. She confirmed she already picked up her oral medications but can use the next time. She was very Patent attorney.  Copy will be given to [facility] for billing/copay submissions.  She has my card for any additional financial questions or concerns.

## 2018-11-30 NOTE — Progress Notes (Signed)
Patient came in and brought proof of income for [employer].  Patient approved for one-time $1000 [employer] grant to assist with personal expenses while going through treatment. Gave her a copy of the approval letter as well as the expense sheet along with the Outpatient pharmacy information. Discussed in detail the expenses and how they are submitted. Showed her the mailbox to drop bills in if she needs to submit. She verbalized understanding.  Also gave her Meredith's number for any Radiation related concerns.  She has my card for any additional financial questions or concerns.

## 2018-12-01 ENCOUNTER — Other Ambulatory Visit: Payer: Self-pay | Admitting: Radiation Oncology

## 2018-12-01 ENCOUNTER — Ambulatory Visit
Admission: RE | Admit: 2018-12-01 | Discharge: 2018-12-01 | Disposition: A | Discharge status: Home / Self Care | Payer: BLUE CROSS/BLUE SHIELD | Source: Ambulatory Visit | Attending: Radiation Oncology | Admitting: Radiation Oncology

## 2018-12-01 ENCOUNTER — Other Ambulatory Visit: Payer: Self-pay

## 2018-12-01 DIAGNOSIS — Z510 Encounter for antineoplastic radiation therapy: Secondary | ICD-10-CM | POA: Diagnosis not present

## 2018-12-01 MED ORDER — MORPHINE SULFATE ER 30 MG PO TBCR: 30.0000 mg | EXTENDED_RELEASE_TABLET | Freq: Two times a day (BID) | ORAL | 0 refills | Status: DC

## 2018-12-02 ENCOUNTER — Ambulatory Visit
Admission: RE | Admit: 2018-12-02 | Discharge: 2018-12-02 | Disposition: A | Discharge status: Home / Self Care | Payer: BLUE CROSS/BLUE SHIELD | Source: Ambulatory Visit | Attending: Radiation Oncology | Admitting: Radiation Oncology

## 2018-12-02 ENCOUNTER — Inpatient Hospital Stay: Payer: BLUE CROSS/BLUE SHIELD

## 2018-12-02 ENCOUNTER — Other Ambulatory Visit: Payer: Self-pay

## 2018-12-02 DIAGNOSIS — Z510 Encounter for antineoplastic radiation therapy: Secondary | ICD-10-CM | POA: Diagnosis not present

## 2018-12-02 DIAGNOSIS — C531 Malignant neoplasm of exocervix: Secondary | ICD-10-CM

## 2018-12-02 LAB — CBC WITH DIFFERENTIAL/PLATELET
Abs Immature Granulocytes: 0.03 10*3/uL (ref 0.00–0.07)
Basophils Absolute: 0 10*3/uL (ref 0.0–0.1)
Basophils Relative: 0 %
Eosinophils Absolute: 0 10*3/uL (ref 0.0–0.5)
Eosinophils Relative: 0 %
HCT: 35.8 % — ABNORMAL LOW (ref 36.0–46.0)
Hemoglobin: 11.9 g/dL — ABNORMAL LOW (ref 12.0–15.0)
Immature Granulocytes: 0 %
Lymphocytes Relative: 12 %
Lymphs Abs: 0.9 10*3/uL (ref 0.7–4.0)
MCH: 29.2 pg (ref 26.0–34.0)
MCHC: 33.2 g/dL (ref 30.0–36.0)
MCV: 88 fL (ref 80.0–100.0)
Monocytes Absolute: 0.4 10*3/uL (ref 0.1–1.0)
Monocytes Relative: 6 %
Neutro Abs: 6.3 10*3/uL (ref 1.7–7.7)
Neutrophils Relative %: 82 %
Platelets: 266 10*3/uL (ref 150–400)
RBC: 4.07 MIL/uL (ref 3.87–5.11)
RDW: 11.9 % (ref 11.5–15.5)
WBC: 7.7 10*3/uL (ref 4.0–10.5)
nRBC: 0 % (ref 0.0–0.2)

## 2018-12-02 LAB — MAGNESIUM: Magnesium: 1.8 mg/dL (ref 1.7–2.4)

## 2018-12-02 LAB — CMP (CANCER CENTER ONLY)
ALT: 13 U/L (ref 0–44)
AST: 16 U/L (ref 15–41)
Albumin: 3.4 g/dL — ABNORMAL LOW (ref 3.5–5.0)
Alkaline Phosphatase: 79 U/L (ref 38–126)
Anion gap: 11 (ref 5–15)
BUN: 12 mg/dL (ref 6–20)
CO2: 22 mmol/L (ref 22–32)
Calcium: 8.7 mg/dL — ABNORMAL LOW (ref 8.9–10.3)
Chloride: 99 mmol/L (ref 98–111)
Creatinine: 1.1 mg/dL — ABNORMAL HIGH (ref 0.44–1.00)
GFR, Est AFR Am: >60 mL/min (ref >60)
GFR, Estimated: 58 mL/min — ABNORMAL LOW (ref >60)
Glucose, Bld: 126 mg/dL — ABNORMAL HIGH (ref 70–99)
Potassium: 4 mmol/L (ref 3.5–5.1)
Sodium: 132 mmol/L — ABNORMAL LOW (ref 135–145)
Total Bilirubin: 0.2 mg/dL — ABNORMAL LOW (ref 0.3–1.2)
Total Protein: 7 g/dL (ref 6.5–8.1)

## 2018-12-03 ENCOUNTER — Other Ambulatory Visit: Payer: Self-pay

## 2018-12-03 ENCOUNTER — Encounter: Payer: Self-pay | Admitting: Hematology and Oncology

## 2018-12-03 ENCOUNTER — Inpatient Hospital Stay (HOSPITAL_BASED_OUTPATIENT_CLINIC_OR_DEPARTMENT_OTHER): Payer: BLUE CROSS/BLUE SHIELD | Admitting: Hematology and Oncology

## 2018-12-03 ENCOUNTER — Ambulatory Visit
Admission: RE | Admit: 2018-12-03 | Discharge: 2018-12-03 | Disposition: A | Discharge status: Home / Self Care | Payer: BLUE CROSS/BLUE SHIELD | Source: Ambulatory Visit | Attending: Radiation Oncology | Admitting: Radiation Oncology

## 2018-12-03 DIAGNOSIS — Z720 Tobacco use: Secondary | ICD-10-CM

## 2018-12-03 DIAGNOSIS — Z79899 Other long term (current) drug therapy: Secondary | ICD-10-CM

## 2018-12-03 DIAGNOSIS — F17210 Nicotine dependence, cigarettes, uncomplicated: Secondary | ICD-10-CM

## 2018-12-03 DIAGNOSIS — C531 Malignant neoplasm of exocervix: Secondary | ICD-10-CM

## 2018-12-03 DIAGNOSIS — K5909 Other constipation: Secondary | ICD-10-CM | POA: Diagnosis not present

## 2018-12-03 DIAGNOSIS — Z510 Encounter for antineoplastic radiation therapy: Secondary | ICD-10-CM | POA: Diagnosis not present

## 2018-12-03 DIAGNOSIS — N1330 Unspecified hydronephrosis: Secondary | ICD-10-CM

## 2018-12-03 NOTE — Assessment & Plan Note (Signed)
Overall, she tolerated chemotherapy very well without major side effects She denies pain We will proceed with treatment as scheduled

## 2018-12-03 NOTE — Progress Notes (Signed)
[place] OFFICE PROGRESS NOTE  Patient Care Team: Marguerita Merles, MD as PCP - General (Family Medicine) Gae Dry, MD as Referring Physician (Obstetrics and Gynecology) Clent Jacks, RN as Registered Nurse Awanda Mink, Craige Cotta, RN as Oncology Nurse Navigator (Oncology)  ASSESSMENT & PLAN:  Cervical ca [facility]) Overall, she tolerated chemotherapy very well without major side effects She denies pain We will proceed with treatment as scheduled  Tobacco abuse She is doing well in this regard and attempting to wean herself off We discussed the importance of nicotine cessation  Hydronephrosis of right kidney Her renal function is stable/improved We will continue to monitor carefully We discussed the importance of adequate fluid hydration  Other constipation This is well controlled with laxatives.   No orders of the defined types were placed in this encounter.   INTERVAL HISTORY: Please see below for problem oriented charting. She returns for further follow-up She is doing well Denies recent nausea or vomiting Bowel habits are stable Denies abnormal pelvic pain She had some vaginal discharge within a few days after chemo but that has resolved She is attempting to quit smoking  SUMMARY OF ONCOLOGIC HISTORY:   Cervical ca ([place])   11/03/2018 Initial Diagnosis    She presented with postmenopausal bleeding since 2019 (at least 1 year) and weight loss. Had prior abnormal PAP smear, last done in 2010    11/04/2018 Pathology Results    Endometrium, biopsy - INVASIVE MODERATELY DIFFERENTIATED SQUAMOUS CELL CARCINOMA. SEE NOTE Diagnosis Note Squamous cell carcinoma arises in a background of high-grade dysplasia with possible glandular involvement. Tumor cells are positive for CK5/6, consistent with the above diagnosis. Immunohistochemical stain for p16 is diffusely positive in the tumor cells.    11/17/2018 Cancer Staging    Staging form: Cervix Uteri, AJCC 8th  Edition - Clinical: FIGO Stage IIIB (cT3b, cN0, cM0) - Signed by Heath Lark, MD on 11/17/2018    11/20/2018 PET scan    IMPRESSION: 1. Cervical lesion with direct extra cervical extension posterolaterally on the right is markedly hypermetabolic consistent with known neoplasm. 2. 7 x 11 mm posterior right pelvic irregular nodule is hypermetabolic consistent with metastatic disease. 3. Para-aortic soft tissue in the region of the bifurcation and 8 mm short axis left pelvic sidewall lymph node show marked hypermetabolism, consistent with metastatic involvement. 4. Low level FDG uptake identified in a unenlarged AP window lymph node of the mediastinum with focal FDG uptake identified in each hilum. While no underlying hilar lymph nodes evident on this noncontrast CT exam, tiny lymph nodes were visible in the hilar regions on diagnostic CT of 11/17/2018. While this may be reactive given the low level uptake, metastatic disease cannot be definitively excluded. 5. Focus of hypermetabolic activity in the spinal canal at the T11-12 level. No underlying mass lesion evident on noncontrast CT imaging. Thoracic spine MRI without and with contrast may be warranted to further evaluate. 6. Stable moderate right hydroureteronephrosis.    11/25/2018 Procedure    Placement of single lumen port a cath via right internal jugular vein. The catheter tip lies at the cavo-atrial junction. A power injectable port a cath was placed and is ready for immediate use.     11/26/2018 Imaging    MRI spine Negative for metastatic disease. No abnormality to correlate with potential lesion within the spinal canal at T11-12 as seen on prior CT is identified.  Remote, mild superior endplate compression fracture of T12.  Widely patent central canal and  foramina throughout.  Right hydronephrosis as seen on prior studies.    11/27/2018 -  Chemotherapy    The patient had weekly cisplatin with radiation     REVIEW OF SYSTEMS:    Constitutional: Denies fevers, chills or abnormal weight loss Eyes: Denies blurriness of vision Ears, nose, mouth, throat, and face: Denies mucositis or sore throat Respiratory: Denies cough, dyspnea or wheezes Cardiovascular: Denies palpitation, chest discomfort or lower extremity swelling Gastrointestinal:  Denies nausea, heartburn or change in bowel habits Skin: Denies abnormal skin rashes Lymphatics: Denies new lymphadenopathy or easy bruising Neurological:Denies numbness, tingling or new weaknesses Behavioral/Psych: Mood is stable, no new changes  All other systems were reviewed with the patient and are negative.  I have reviewed the past medical history, past surgical history, social history and family history with the patient and they are unchanged from previous note.  ALLERGIES:  has No Known Allergies.  MEDICATIONS:  Current Outpatient Medications  Medication Sig Dispense Refill  . acetaminophen (TYLENOL) 500 MG tablet Take 1,000 mg by mouth every 6 (six) hours as needed (pain).    Marland Kitchen ibuprofen (ADVIL) 800 MG tablet Take 200 mg by mouth every 6 (six) hours as needed.    . lidocaine-prilocaine (EMLA) cream Apply to affected area once (Patient taking differently: Apply 1 application topically daily as needed (local anesthetic). Apply to affected area once) 30 g 3  . morphine (MS CONTIN) 30 MG 12 hr tablet Take 1 tablet (30 mg total) by mouth every 12 (twelve) hours. 15 tablet 0  . ondansetron (ZOFRAN) 8 MG tablet Take 1 tablet (8 mg total) by mouth every 8 (eight) hours as needed. Start on the third day after chemotherapy. 30 tablet 1  . prochlorperazine (COMPAZINE) 10 MG tablet Take 1 tablet (10 mg total) by mouth every 6 (six) hours as needed (Nausea or vomiting). 30 tablet 1  . tiZANidine (ZANAFLEX) 2 MG tablet Take 2 mg by mouth at bedtime as needed.      No current facility-administered medications for this visit.     PHYSICAL EXAMINATION: ECOG PERFORMANCE STATUS: 1 -  Symptomatic but completely ambulatory  Vitals:   12/03/18 1136  BP: (!) 150/87  Pulse: 78  Resp: 18  Temp: 98.5 F (36.9 C)  SpO2: 100%   Filed Weights   12/03/18 1136  Weight: 122 lb 6.4 oz (55.5 kg)    GENERAL:alert, no distress and comfortable SKIN: skin color, texture, turgor are normal, no rashes or significant lesions EYES: normal, Conjunctiva are pink and non-injected, sclera clear OROPHARYNX:no exudate, no erythema and lips, buccal mucosa, and tongue normal  NECK: supple, thyroid normal size, non-tender, without nodularity LYMPH:  no palpable lymphadenopathy in the cervical, axillary or inguinal LUNGS: clear to auscultation and percussion with normal breathing effort HEART: regular rate & rhythm and no murmurs and no lower extremity edema ABDOMEN:abdomen soft, non-tender and normal bowel sounds Musculoskeletal:no cyanosis of digits and no clubbing  NEURO: alert & oriented x 3 with fluent speech, no focal motor/sensory deficits  LABORATORY DATA:  I have reviewed the data as listed    Component Value Date/Time   NA 132 (L) 12/02/2018 1517   K 4.0 12/02/2018 1517   CL 99 12/02/2018 1517   CO2 22 12/02/2018 1517   GLUCOSE 126 (H) 12/02/2018 1517   BUN 12 12/02/2018 1517   CREATININE 1.10 (H) 12/02/2018 1517   CALCIUM 8.7 (L) 12/02/2018 1517   PROT 7.0 12/02/2018 1517   ALBUMIN 3.4 (L) 12/02/2018 1517  AST 16 12/02/2018 1517   ALT 13 12/02/2018 1517   ALKPHOS 79 12/02/2018 1517   BILITOT 0.2 (L) 12/02/2018 1517   GFRNONAA 58 (L) 12/02/2018 1517   GFRAA >60 12/02/2018 1517    No results found for: SPEP, UPEP  Lab Results  Component Value Date   WBC 7.7 12/02/2018   NEUTROABS 6.3 12/02/2018   HGB 11.9 (L) 12/02/2018   HCT 35.8 (L) 12/02/2018   MCV 88.0 12/02/2018   PLT 266 12/02/2018      Chemistry      Component Value Date/Time   NA 132 (L) 12/02/2018 1517   K 4.0 12/02/2018 1517   CL 99 12/02/2018 1517   CO2 22 12/02/2018 1517   BUN 12  12/02/2018 1517   CREATININE 1.10 (H) 12/02/2018 1517      Component Value Date/Time   CALCIUM 8.7 (L) 12/02/2018 1517   ALKPHOS 79 12/02/2018 1517   AST 16 12/02/2018 1517   ALT 13 12/02/2018 1517   BILITOT 0.2 (L) 12/02/2018 1517       RADIOGRAPHIC STUDIES: I have personally reviewed the radiological images as listed and agreed with the findings in the report. Ct Chest W Contrast  Result Date: 11/17/2018 CLINICAL DATA:  Cervical cancer. EXAM: CT CHEST, ABDOMEN, AND PELVIS WITH CONTRAST TECHNIQUE: Multidetector CT imaging of the chest, abdomen and pelvis was performed following the standard protocol during bolus administration of intravenous contrast. CONTRAST:  85mL OMNIPAQUE IOHEXOL 300 MG/ML  SOLN COMPARISON:  None. FINDINGS: CT CHEST FINDINGS Cardiovascular: The heart size is normal. No substantial pericardial effusion. Mediastinum/Nodes: No mediastinal lymphadenopathy. There is no hilar lymphadenopathy. The esophagus has normal imaging features. There is no axillary lymphadenopathy. Lungs/Pleura: The central tracheobronchial airways are patent. Biapical pleuroparenchymal scarring evident. No suspicious nodule or mass. No pleural effusion. Musculoskeletal: No worrisome lytic or sclerotic osseous abnormality. Superior endplate compression fracture at T12 appears nonacute. CT ABDOMEN PELVIS FINDINGS Hepatobiliary: No suspicious focal abnormality within the liver parenchyma. There is no evidence for gallstones, gallbladder wall thickening, or pericholecystic fluid. No intrahepatic or extrahepatic biliary dilation. Pancreas: No focal mass lesion. No dilatation of the main duct. No intraparenchymal cyst. No peripancreatic edema. Spleen: No splenomegaly. No focal mass lesion. Adrenals/Urinary Tract: No adrenal nodule or mass. Moderate right hydroureteronephrosis is identified with dilated right ureter down into the pelvis. Left kidney and ureter unremarkable. The urinary bladder appears normal for  the degree of distention. Stomach/Bowel: Stomach is unremarkable. No gastric wall thickening. No evidence of outlet obstruction. Duodenum is normally positioned as is the ligament of Treitz. No small bowel wall thickening. No small bowel dilatation. The terminal ileum is normal. No gross colonic mass. No colonic wall thickening. Vascular/Lymphatic: There is abdominal aortic atherosclerosis without aneurysm. No gastrohepatic or hepato duodenal ligament lymphadenopathy. Abnormal soft tissue is seen around the distal aorta involving the IMA origin (see 73/2). This amorphous abnormal soft tissue extends down along the iliac arteries bilaterally. Reproductive: The endometrial cavity of the uterus is fluid-filled and markedly distended, consistent with cervical obstruction. The cervix is heterogeneous and appears enlarged. Extending posterolaterally to the right from the cervical tissue is a large 6.6 x 3.0 cm necrotic irregular soft tissue mass extending into the right presacral space. The upper vaginal wall thickening evident. Other: Trace free fluid noted in the pelvis. There is presacral edema evident. Musculoskeletal: No worrisome lytic or sclerotic osseous abnormality. IMPRESSION: 1. Irregular enlargement of the cervix with extension of soft tissue from the cervix into the posterior presacral  space along the side of the rectum. Wall thickening in the upper vagina is compatible with tumor involvement. The endometrial cavity of the uterus is markedly expanded and fluid-filled consistent with obstruction. No definite involvement of the posterior bladder wall evident by CT. 2. Moderate right hydroureteronephrosis with ureteral dilatation extending down to the right pelvic sidewall adjacent to the cervix. 3. Abnormal, amorphous soft tissue surrounds the distal aorta and common iliac arteries. Imaging appearance concerning for tumor spread. 4. No evidence for metastatic disease in the chest. 5.  Aortic Atherosclerois  (ICD10-170.0) Electronically Signed   By: Misty Stanley M.D.   On: 11/17/2018 16:34   Mr Thoracic Spine W Wo Contrast  Result Date: 11/26/2018 CLINICAL DATA:  Patient with a history of metastatic cervical carcinoma. Focal uptake seen in the central spinal canal at the 11 of T11-12. Question metastatic disease. EXAM: MRI THORACIC WITHOUT AND WITH CONTRAST TECHNIQUE: Multiplanar and multiecho pulse sequences of the thoracic spine were obtained without and with intravenous contrast. CONTRAST:  5 cc Gadavist IV. COMPARISON:  And PET CT scan 11/20/2018. CT chest, abdomen and pelvis 11/17/2018. FINDINGS: MRI THORACIC SPINE FINDINGS Alignment:  Normal. Vertebrae: Mild, remote superior endplate compression fracture of T12 is identified as seen on the prior examinations. Vertebral body height loss is up to approximately 15% anteriorly. Bone marrow signal is normal throughout without evidence of metastatic disease. Cord: Normal size, shape and signal throughout. The conus medullaris is normal in signal and position at the T12-L1 level. No enhancement of the cord is identified. No lesion within the central spinal canal is present. Paraspinal and other soft tissues: See report of prior cross sectional imaging studies. Right hydronephrosis noted. Disc levels: The central spinal canal and neural foramina are widely patent throughout. Minimal retropulsion off the superior endplate of E72 is noted. IMPRESSION: Negative for metastatic disease. No abnormality to correlate with potential lesion within the spinal canal at T11-12 as seen on prior CT is identified. Remote, mild superior endplate compression fracture of T12. Widely patent central canal and foramina throughout. Right hydronephrosis as seen on prior studies. Electronically Signed   By: Inge Rise M.D.   On: 11/26/2018 14:08   Ct Abdomen Pelvis W Contrast  Result Date: 11/17/2018 CLINICAL DATA:  Cervical cancer. EXAM: CT CHEST, ABDOMEN, AND PELVIS WITH CONTRAST  TECHNIQUE: Multidetector CT imaging of the chest, abdomen and pelvis was performed following the standard protocol during bolus administration of intravenous contrast. CONTRAST:  60mL OMNIPAQUE IOHEXOL 300 MG/ML  SOLN COMPARISON:  None. FINDINGS: CT CHEST FINDINGS Cardiovascular: The heart size is normal. No substantial pericardial effusion. Mediastinum/Nodes: No mediastinal lymphadenopathy. There is no hilar lymphadenopathy. The esophagus has normal imaging features. There is no axillary lymphadenopathy. Lungs/Pleura: The central tracheobronchial airways are patent. Biapical pleuroparenchymal scarring evident. No suspicious nodule or mass. No pleural effusion. Musculoskeletal: No worrisome lytic or sclerotic osseous abnormality. Superior endplate compression fracture at T12 appears nonacute. CT ABDOMEN PELVIS FINDINGS Hepatobiliary: No suspicious focal abnormality within the liver parenchyma. There is no evidence for gallstones, gallbladder wall thickening, or pericholecystic fluid. No intrahepatic or extrahepatic biliary dilation. Pancreas: No focal mass lesion. No dilatation of the main duct. No intraparenchymal cyst. No peripancreatic edema. Spleen: No splenomegaly. No focal mass lesion. Adrenals/Urinary Tract: No adrenal nodule or mass. Moderate right hydroureteronephrosis is identified with dilated right ureter down into the pelvis. Left kidney and ureter unremarkable. The urinary bladder appears normal for the degree of distention. Stomach/Bowel: Stomach is unremarkable. No gastric wall thickening. No  evidence of outlet obstruction. Duodenum is normally positioned as is the ligament of Treitz. No small bowel wall thickening. No small bowel dilatation. The terminal ileum is normal. No gross colonic mass. No colonic wall thickening. Vascular/Lymphatic: There is abdominal aortic atherosclerosis without aneurysm. No gastrohepatic or hepato duodenal ligament lymphadenopathy. Abnormal soft tissue is seen around the  distal aorta involving the IMA origin (see 73/2). This amorphous abnormal soft tissue extends down along the iliac arteries bilaterally. Reproductive: The endometrial cavity of the uterus is fluid-filled and markedly distended, consistent with cervical obstruction. The cervix is heterogeneous and appears enlarged. Extending posterolaterally to the right from the cervical tissue is a large 6.6 x 3.0 cm necrotic irregular soft tissue mass extending into the right presacral space. The upper vaginal wall thickening evident. Other: Trace free fluid noted in the pelvis. There is presacral edema evident. Musculoskeletal: No worrisome lytic or sclerotic osseous abnormality. IMPRESSION: 1. Irregular enlargement of the cervix with extension of soft tissue from the cervix into the posterior presacral space along the side of the rectum. Wall thickening in the upper vagina is compatible with tumor involvement. The endometrial cavity of the uterus is markedly expanded and fluid-filled consistent with obstruction. No definite involvement of the posterior bladder wall evident by CT. 2. Moderate right hydroureteronephrosis with ureteral dilatation extending down to the right pelvic sidewall adjacent to the cervix. 3. Abnormal, amorphous soft tissue surrounds the distal aorta and common iliac arteries. Imaging appearance concerning for tumor spread. 4. No evidence for metastatic disease in the chest. 5.  Aortic Atherosclerois (ICD10-170.0) Electronically Signed   By: Misty Stanley M.D.   On: 11/17/2018 16:34   Nm Pet Image Initial (pi) Skull Base To Thigh  Result Date: 11/20/2018 CLINICAL DATA:  Initial treatment strategy for cervical cancer. EXAM: NUCLEAR MEDICINE PET SKULL BASE TO THIGH TECHNIQUE: 6.2 mCi F-18 FDG was injected intravenously. Full-ring PET imaging was performed from the skull base to thigh after the radiotracer. CT data was obtained and used for attenuation correction and anatomic localization. Fasting blood  glucose: 94 mg/dl COMPARISON:  CT abdomen/pelvis 11/17/2018 FINDINGS: Mediastinal blood pool activity: SUV max 2.1 NECK: No hypermetabolic lymph nodes in the neck. Areas of FDG uptake identified in the maxilla and mandible, potentially related to periodontal disease. Incidental CT findings: none CHEST: 7 mm short axis AP window lymph node (59/4) shows low level uptake with SUV max = 4.2. Foci of hypermetabolic FDG accumulation are identified in both hilar regions without underlying lymph nodes evident on noncontrast CT imaging. SUV max = 3.6 right hilum. Incidental CT findings: Biapical pleuroparenchymal scarring evident. No suspicious pulmonary nodule or mass. No pleural effusion. ABDOMEN/PELVIS: No abnormal hypermetabolic activity within the liver, pancreas, adrenal glands, or spleen. The abnormal para-aortic soft tissue seen just proximal to the bifurcation on the previous exam is hypermetabolic with SUV max = 4.5. 8 mm short axis left pelvic sidewall lymph node (644/0) is hypermetabolic with SUV max = 6.2. 7 x 11 mm nodule posterior right pelvis (347/4) is hypermetabolic with SUV max = 4.3. XR cervical direct extension of tumor posterolaterally on the right is seen previously is hypermetabolic with SUV max = 25.9. Incidental CT findings: Similar appearance milder it right hydroureteronephrosis with ureteral obstruction in the region of the cervical lesion. Asymmetric bladder wall thickening posteriorly on the right (167/4) is more prominent on the current study than previously. Radiotracer in the bladder urine obscures assessment of the bladder wall. SKELETON: No focal hypermetabolic activity to suggest skeletal  metastasis. Focal hypermetabolism identified in the spinal canal at the level of T11-12 without underlying lesion evident on noncontrast CT. SUV max = 3.2. Incidental CT findings: none IMPRESSION: 1. Cervical lesion with direct extra cervical extension posterolaterally on the right is markedly  hypermetabolic consistent with known neoplasm. 2. 7 x 11 mm posterior right pelvic irregular nodule is hypermetabolic consistent with metastatic disease. 3. Para-aortic soft tissue in the region of the bifurcation and 8 mm short axis left pelvic sidewall lymph node show marked hypermetabolism, consistent with metastatic involvement. 4. Low level FDG uptake identified in a unenlarged AP window lymph node of the mediastinum with focal FDG uptake identified in each hilum. While no underlying hilar lymph nodes evident on this noncontrast CT exam, tiny lymph nodes were visible in the hilar regions on diagnostic CT of 11/17/2018. While this may be reactive given the low level uptake, metastatic disease cannot be definitively excluded. 5. Focus of hypermetabolic activity in the spinal canal at the T11-12 level. No underlying mass lesion evident on noncontrast CT imaging. Thoracic spine MRI without and with contrast may be warranted to further evaluate. 6. Stable moderate right hydroureteronephrosis. Electronically Signed   By: Misty Stanley M.D.   On: 11/20/2018 17:24   Ir Imaging Guided Port Insertion  Result Date: 11/25/2018 CLINICAL DATA:  Cervical carcinoma and need for porta cath to begin chemotherapy. EXAM: IMPLANTED PORT A CATH PLACEMENT WITH ULTRASOUND AND FLUOROSCOPIC GUIDANCE ANESTHESIA/SEDATION: 2.0 mg IV Versed; 100 mcg IV Fentanyl Total Moderate Sedation Time:  30 minutes The patient's level of consciousness and physiologic status were continuously monitored during the procedure by Radiology nursing. Additional Medications: 2 g IV Ancef. FLUOROSCOPY TIME:  12 seconds.  0.4 mGy. PROCEDURE: The procedure, risks, benefits, and alternatives were explained to the patient. Questions regarding the procedure were encouraged and answered. The patient understands and consents to the procedure. A time-out was performed prior to initiating the procedure. Ultrasound was utilized to confirm patency of the right internal  jugular vein. The right neck and chest were prepped with chlorhexidine in a sterile fashion, and a sterile drape was applied covering the operative field. Maximum barrier sterile technique with sterile gowns and gloves were used for the procedure. Local anesthesia was provided with 1% lidocaine. After creating a small venotomy incision, a 21 gauge needle was advanced into the right internal jugular vein under direct, real-time ultrasound guidance. Ultrasound image documentation was performed. After securing guidewire access, an 8 Fr dilator was placed. A J-wire was kinked to measure appropriate catheter length. A subcutaneous port pocket was then created along the upper chest wall utilizing sharp and blunt dissection. Portable cautery was utilized. The pocket was irrigated with sterile saline. A single lumen power injectable port was chosen for placement. The 8 Fr catheter was tunneled from the port pocket site to the venotomy incision. The port was placed in the pocket. External catheter was trimmed to appropriate length based on guidewire measurement. At the venotomy, an 8 Fr peel-away sheath was placed over a guidewire. The catheter was then placed through the sheath and the sheath removed. Final catheter positioning was confirmed and documented with a fluoroscopic spot image. The port was accessed with a needle and aspirated and flushed with heparinized saline. The access needle was removed. The venotomy and port pocket incisions were closed with subcutaneous 3-0 Monocryl and subcuticular 4-0 Vicryl. Dermabond was applied to both incisions. COMPLICATIONS: COMPLICATIONS None FINDINGS: After catheter placement, the tip lies at the cavo-atrial junction. The catheter  aspirates normally and is ready for immediate use. IMPRESSION: Placement of single lumen port a cath via right internal jugular vein. The catheter tip lies at the cavo-atrial junction. A power injectable port a cath was placed and is ready for immediate  use. Electronically Signed   By: Aletta Edouard M.D.   On: 11/25/2018 10:49    All questions were answered. The patient knows to call the clinic with any problems, questions or concerns. No barriers to learning was detected.  I spent 15 minutes counseling the patient face to face. The total time spent in the appointment was 20 minutes and more than 50% was on counseling and review of test results  Heath Lark, MD 12/03/2018 1:13 PM

## 2018-12-03 NOTE — Assessment & Plan Note (Signed)
She is doing well in this regard and attempting to wean herself off We discussed the importance of nicotine cessation

## 2018-12-03 NOTE — Assessment & Plan Note (Signed)
This is well controlled with laxatives.

## 2018-12-03 NOTE — Assessment & Plan Note (Signed)
Her renal function is stable/improved We will continue to monitor carefully We discussed the importance of adequate fluid hydration

## 2018-12-04 ENCOUNTER — Ambulatory Visit
Admission: RE | Admit: 2018-12-04 | Discharge: 2018-12-04 | Disposition: A | Discharge status: Home / Self Care | Payer: BLUE CROSS/BLUE SHIELD | Source: Ambulatory Visit | Attending: Radiation Oncology | Admitting: Radiation Oncology

## 2018-12-04 ENCOUNTER — Inpatient Hospital Stay: Payer: BLUE CROSS/BLUE SHIELD | Attending: Gynecologic Oncology

## 2018-12-04 ENCOUNTER — Other Ambulatory Visit: Payer: Self-pay

## 2018-12-04 VITALS — BP 141/92 | HR 83 | Temp 98.1°F | Resp 18

## 2018-12-04 DIAGNOSIS — R197 Diarrhea, unspecified: Secondary | ICD-10-CM | POA: Insufficient documentation

## 2018-12-04 DIAGNOSIS — Z79899 Other long term (current) drug therapy: Secondary | ICD-10-CM | POA: Diagnosis not present

## 2018-12-04 DIAGNOSIS — C531 Malignant neoplasm of exocervix: Secondary | ICD-10-CM | POA: Insufficient documentation

## 2018-12-04 DIAGNOSIS — R110 Nausea: Secondary | ICD-10-CM | POA: Diagnosis not present

## 2018-12-04 DIAGNOSIS — D61810 Antineoplastic chemotherapy induced pancytopenia: Secondary | ICD-10-CM | POA: Insufficient documentation

## 2018-12-04 DIAGNOSIS — R195 Other fecal abnormalities: Secondary | ICD-10-CM | POA: Diagnosis not present

## 2018-12-04 DIAGNOSIS — N1330 Unspecified hydronephrosis: Secondary | ICD-10-CM | POA: Insufficient documentation

## 2018-12-04 DIAGNOSIS — Z5189 Encounter for other specified aftercare: Secondary | ICD-10-CM | POA: Diagnosis not present

## 2018-12-04 DIAGNOSIS — H9313 Tinnitus, bilateral: Secondary | ICD-10-CM | POA: Insufficient documentation

## 2018-12-04 DIAGNOSIS — Z510 Encounter for antineoplastic radiation therapy: Secondary | ICD-10-CM | POA: Insufficient documentation

## 2018-12-04 DIAGNOSIS — Z5111 Encounter for antineoplastic chemotherapy: Secondary | ICD-10-CM | POA: Insufficient documentation

## 2018-12-04 DIAGNOSIS — Z720 Tobacco use: Secondary | ICD-10-CM | POA: Insufficient documentation

## 2018-12-04 MED ORDER — SODIUM CHLORIDE 0.9% FLUSH
10.0000 mL | INTRAVENOUS | Status: DC | PRN
  Administered 2018-12-04: 10 mL
  Filled 2018-12-04: qty 10

## 2018-12-04 MED ORDER — POTASSIUM CHLORIDE 2 MEQ/ML IV SOLN
Freq: Once | INTRAVENOUS | Status: AC
  Administered 2018-12-04: 08:00:00 via INTRAVENOUS
  Filled 2018-12-04: qty 10

## 2018-12-04 MED ORDER — SODIUM CHLORIDE 0.9 % IV SOLN
Freq: Once | INTRAVENOUS | Status: AC
  Administered 2018-12-04: 10:00:00 via INTRAVENOUS
  Filled 2018-12-04: qty 5

## 2018-12-04 MED ORDER — PALONOSETRON HCL INJECTION 0.25 MG/5ML
INTRAVENOUS | Status: AC
  Filled 2018-12-04: qty 5

## 2018-12-04 MED ORDER — SODIUM CHLORIDE 0.9 % IV SOLN
40.0000 mg/m2 | Freq: Once | INTRAVENOUS | Status: AC
  Administered 2018-12-04: 66 mg via INTRAVENOUS
  Filled 2018-12-04: qty 66

## 2018-12-04 MED ORDER — SODIUM CHLORIDE 0.9 % IV SOLN
Freq: Once | INTRAVENOUS | Status: AC
  Administered 2018-12-04: 08:00:00 via INTRAVENOUS
  Filled 2018-12-04: qty 250

## 2018-12-04 MED ORDER — HEPARIN SOD (PORK) LOCK FLUSH 100 UNIT/ML IV SOLN
500.0000 [IU] | Freq: Once | INTRAVENOUS | Status: AC | PRN
  Administered 2018-12-04: 500 [IU]
  Filled 2018-12-04: qty 5

## 2018-12-04 MED ORDER — PALONOSETRON HCL INJECTION 0.25 MG/5ML
0.2500 mg | Freq: Once | INTRAVENOUS | Status: AC
  Administered 2018-12-04: 0.25 mg via INTRAVENOUS

## 2018-12-04 NOTE — Patient Instructions (Signed)
[facility] Discharge Instructions for Patients Receiving Chemotherapy  Today you received the following chemotherapy agents Cisplatin  To help prevent nausea and vomiting after your treatment, we encourage you to take your nausea medication as directed  If you develop nausea and vomiting that is not controlled by your nausea medication, call the clinic.   BELOW ARE SYMPTOMS THAT SHOULD BE REPORTED IMMEDIATELY:  *FEVER GREATER THAN 100.5 F  *CHILLS WITH OR WITHOUT FEVER  NAUSEA AND VOMITING THAT IS NOT CONTROLLED WITH YOUR NAUSEA MEDICATION  *UNUSUAL SHORTNESS OF BREATH  *UNUSUAL BRUISING OR BLEEDING  TENDERNESS IN MOUTH AND THROAT WITH OR WITHOUT PRESENCE OF ULCERS  *URINARY PROBLEMS  *BOWEL PROBLEMS  UNUSUAL RASH Items with * indicate a potential emergency and should be followed up as soon as possible.  Feel free to call the clinic should you have any questions or concerns. The clinic phone number is [phone].  Please show the CHEMO ALERT CARD at check-in to the Emergency Department and triage nurse.   

## 2018-12-07 ENCOUNTER — Other Ambulatory Visit: Payer: Self-pay

## 2018-12-07 ENCOUNTER — Ambulatory Visit
Admission: RE | Admit: 2018-12-07 | Discharge: 2018-12-07 | Disposition: A | Discharge status: Home / Self Care | Payer: BLUE CROSS/BLUE SHIELD | Source: Ambulatory Visit | Attending: Radiation Oncology | Admitting: Radiation Oncology

## 2018-12-07 DIAGNOSIS — Z5111 Encounter for antineoplastic chemotherapy: Secondary | ICD-10-CM | POA: Diagnosis not present

## 2018-12-08 ENCOUNTER — Other Ambulatory Visit: Payer: Self-pay

## 2018-12-08 ENCOUNTER — Ambulatory Visit
Admission: RE | Admit: 2018-12-08 | Discharge: 2018-12-08 | Disposition: A | Discharge status: Home / Self Care | Payer: BLUE CROSS/BLUE SHIELD | Source: Ambulatory Visit | Attending: Radiation Oncology | Admitting: Radiation Oncology

## 2018-12-08 DIAGNOSIS — Z5111 Encounter for antineoplastic chemotherapy: Secondary | ICD-10-CM | POA: Diagnosis not present

## 2018-12-09 ENCOUNTER — Inpatient Hospital Stay: Payer: BLUE CROSS/BLUE SHIELD

## 2018-12-09 ENCOUNTER — Other Ambulatory Visit: Payer: Self-pay

## 2018-12-09 ENCOUNTER — Ambulatory Visit
Admission: RE | Admit: 2018-12-09 | Discharge: 2018-12-09 | Disposition: A | Discharge status: Home / Self Care | Payer: BLUE CROSS/BLUE SHIELD | Source: Ambulatory Visit | Attending: Radiation Oncology | Admitting: Radiation Oncology

## 2018-12-09 DIAGNOSIS — Z5111 Encounter for antineoplastic chemotherapy: Secondary | ICD-10-CM | POA: Diagnosis not present

## 2018-12-09 DIAGNOSIS — C531 Malignant neoplasm of exocervix: Secondary | ICD-10-CM

## 2018-12-09 LAB — CBC WITH DIFFERENTIAL/PLATELET
Abs Immature Granulocytes: 0.01 10*3/uL (ref 0.00–0.07)
Basophils Absolute: 0 10*3/uL (ref 0.0–0.1)
Basophils Relative: 0 %
Eosinophils Absolute: 0 10*3/uL (ref 0.0–0.5)
Eosinophils Relative: 1 %
HCT: 31.5 % — ABNORMAL LOW (ref 36.0–46.0)
Hemoglobin: 10.5 g/dL — ABNORMAL LOW (ref 12.0–15.0)
Immature Granulocytes: 0 %
Lymphocytes Relative: 11 %
Lymphs Abs: 0.4 10*3/uL — ABNORMAL LOW (ref 0.7–4.0)
MCH: 29.2 pg (ref 26.0–34.0)
MCHC: 33.3 g/dL (ref 30.0–36.0)
MCV: 87.5 fL (ref 80.0–100.0)
Monocytes Absolute: 0.3 10*3/uL (ref 0.1–1.0)
Monocytes Relative: 9 %
Neutro Abs: 2.5 10*3/uL (ref 1.7–7.7)
Neutrophils Relative %: 79 %
Platelets: 158 10*3/uL (ref 150–400)
RBC: 3.6 MIL/uL — ABNORMAL LOW (ref 3.87–5.11)
RDW: 12 % (ref 11.5–15.5)
WBC: 3.2 10*3/uL — ABNORMAL LOW (ref 4.0–10.5)
nRBC: 0 % (ref 0.0–0.2)

## 2018-12-09 LAB — CMP (CANCER CENTER ONLY)
ALT: 18 U/L (ref 0–44)
AST: 22 U/L (ref 15–41)
Albumin: 3.2 g/dL — ABNORMAL LOW (ref 3.5–5.0)
Alkaline Phosphatase: 70 U/L (ref 38–126)
Anion gap: 10 (ref 5–15)
BUN: 9 mg/dL (ref 6–20)
CO2: 27 mmol/L (ref 22–32)
Calcium: 8.7 mg/dL — ABNORMAL LOW (ref 8.9–10.3)
Chloride: 98 mmol/L (ref 98–111)
Creatinine: 0.76 mg/dL (ref 0.44–1.00)
GFR, Est AFR Am: >60 mL/min (ref >60)
GFR, Estimated: >60 mL/min (ref >60)
Glucose, Bld: 98 mg/dL (ref 70–99)
Potassium: 3.7 mmol/L (ref 3.5–5.1)
Sodium: 135 mmol/L (ref 135–145)
Total Bilirubin: <0.2 mg/dL — ABNORMAL LOW (ref 0.3–1.2)
Total Protein: 6.5 g/dL (ref 6.5–8.1)

## 2018-12-09 LAB — MAGNESIUM: Magnesium: 1.8 mg/dL (ref 1.7–2.4)

## 2018-12-10 ENCOUNTER — Encounter: Payer: Self-pay | Admitting: Hematology and Oncology

## 2018-12-10 ENCOUNTER — Other Ambulatory Visit: Payer: Self-pay | Admitting: Hematology and Oncology

## 2018-12-10 ENCOUNTER — Inpatient Hospital Stay (HOSPITAL_BASED_OUTPATIENT_CLINIC_OR_DEPARTMENT_OTHER): Payer: BLUE CROSS/BLUE SHIELD | Admitting: Hematology and Oncology

## 2018-12-10 ENCOUNTER — Ambulatory Visit
Admission: RE | Admit: 2018-12-10 | Discharge: 2018-12-10 | Disposition: A | Discharge status: Home / Self Care | Payer: BLUE CROSS/BLUE SHIELD | Source: Ambulatory Visit | Attending: Radiation Oncology | Admitting: Radiation Oncology

## 2018-12-10 ENCOUNTER — Other Ambulatory Visit: Payer: Self-pay

## 2018-12-10 DIAGNOSIS — H9313 Tinnitus, bilateral: Secondary | ICD-10-CM | POA: Diagnosis not present

## 2018-12-10 DIAGNOSIS — D61810 Antineoplastic chemotherapy induced pancytopenia: Secondary | ICD-10-CM

## 2018-12-10 DIAGNOSIS — D61818 Other pancytopenia: Secondary | ICD-10-CM

## 2018-12-10 DIAGNOSIS — Z5111 Encounter for antineoplastic chemotherapy: Secondary | ICD-10-CM | POA: Diagnosis not present

## 2018-12-10 DIAGNOSIS — R110 Nausea: Secondary | ICD-10-CM

## 2018-12-10 DIAGNOSIS — N1330 Unspecified hydronephrosis: Secondary | ICD-10-CM | POA: Diagnosis not present

## 2018-12-10 DIAGNOSIS — R195 Other fecal abnormalities: Secondary | ICD-10-CM

## 2018-12-10 DIAGNOSIS — Z720 Tobacco use: Secondary | ICD-10-CM

## 2018-12-10 DIAGNOSIS — C531 Malignant neoplasm of exocervix: Secondary | ICD-10-CM | POA: Diagnosis not present

## 2018-12-10 DIAGNOSIS — Z79899 Other long term (current) drug therapy: Secondary | ICD-10-CM

## 2018-12-10 MED ORDER — ONDANSETRON HCL 8 MG PO TABS: 8.0000 mg | ORAL_TABLET | Freq: Three times a day (TID) | ORAL | 1 refills | Status: DC | PRN

## 2018-12-10 MED ORDER — PROCHLORPERAZINE MALEATE 10 MG PO TABS: 10.0000 mg | ORAL_TABLET | Freq: Four times a day (QID) | ORAL | 1 refills | Status: DC | PRN

## 2018-12-10 NOTE — Progress Notes (Signed)
[place] OFFICE PROGRESS NOTE  Patient Care Team: Marguerita Merles, MD as PCP - General (Family Medicine) Gae Dry, MD as Referring Physician (Obstetrics and Gynecology) Clent Jacks, RN as Registered Nurse Awanda Mink, Craige Cotta, RN as Oncology Nurse Navigator (Oncology)  ASSESSMENT & PLAN:  Cervical ca [facility]) She tolerated treatment fairly well except for some weight loss, tinnitus/hearing changes and progressive pancytopenia Her renal function has improved Overall, I think she is responding to therapy I plan to change the dose a little bit due to the side effects I recommend increase oral intake as tolerated I will see her again next week for further follow-up  Tobacco abuse She is doing well in this regard and attempting to wean herself off We discussed the importance of nicotine cessation  Pancytopenia, acquired ([place]) She has mild progressive pancytopenia due to treatment I plan minor dose adjustment We discussed neutropenic precaution  Hydronephrosis of right kidney Renal function appears to be improving We will observe only for now  Tinnitus of both ears She has some symptoms of tinnitus likely secondary to side effects of cisplatin I plan minor dose adjustment because of this  Loose bowel movement She has minor loose bowel movement likely secondary to side effects of treatment She will increase oral intake as tolerated  Nausea without vomiting She has frequent nausea without vomiting and have lost some weight I recommend premedication Zofran before radiation treatment She is advised not to take Zofran on the day of the chemotherapy and verbalized understanding   No orders of the defined types were placed in this encounter.   INTERVAL HISTORY: Please see below for problem oriented charting. She is seen prior to cycle 3 of chemotherapy She complained of some tinnitus but denies hearing deficit She has some loose bowel movement and nausea but  no vomiting Denies peripheral neuropathy She is urinating well She has persistent vaginal discharge but it is less Her back pain is stable/improved She continues to smoke about 5 to 6 cigarettes/day and is attempting to quit The patient denies any recent signs or symptoms of bleeding such as spontaneous epistaxis, hematuria or hematochezia. She has lost some weight.  She denies recent fever or chills  SUMMARY OF ONCOLOGIC HISTORY:   Cervical ca ([place])   11/03/2018 Initial Diagnosis    She presented with postmenopausal bleeding since 2019 (at least 1 year) and weight loss. Had prior abnormal PAP smear, last done in 2010    11/04/2018 Pathology Results    Endometrium, biopsy - INVASIVE MODERATELY DIFFERENTIATED SQUAMOUS CELL CARCINOMA. SEE NOTE Diagnosis Note Squamous cell carcinoma arises in a background of high-grade dysplasia with possible glandular involvement. Tumor cells are positive for CK5/6, consistent with the above diagnosis. Immunohistochemical stain for p16 is diffusely positive in the tumor cells.    11/17/2018 Cancer Staging    Staging form: Cervix Uteri, AJCC 8th Edition - Clinical: FIGO Stage IIIB (cT3b, cN0, cM0) - Signed by Heath Lark, MD on 11/17/2018    11/20/2018 PET scan    IMPRESSION: 1. Cervical lesion with direct extra cervical extension posterolaterally on the right is markedly hypermetabolic consistent with known neoplasm. 2. 7 x 11 mm posterior right pelvic irregular nodule is hypermetabolic consistent with metastatic disease. 3. Para-aortic soft tissue in the region of the bifurcation and 8 mm short axis left pelvic sidewall lymph node show marked hypermetabolism, consistent with metastatic involvement. 4. Low level FDG uptake identified in a unenlarged AP window lymph node of the mediastinum  with focal FDG uptake identified in each hilum. While no underlying hilar lymph nodes evident on this noncontrast CT exam, tiny lymph nodes were visible in the hilar regions  on diagnostic CT of 11/17/2018. While this may be reactive given the low level uptake, metastatic disease cannot be definitively excluded. 5. Focus of hypermetabolic activity in the spinal canal at the T11-12 level. No underlying mass lesion evident on noncontrast CT imaging. Thoracic spine MRI without and with contrast may be warranted to further evaluate. 6. Stable moderate right hydroureteronephrosis.    11/25/2018 Procedure    Placement of single lumen port a cath via right internal jugular vein. The catheter tip lies at the cavo-atrial junction. A power injectable port a cath was placed and is ready for immediate use.     11/26/2018 Imaging    MRI spine Negative for metastatic disease. No abnormality to correlate with potential lesion within the spinal canal at T11-12 as seen on prior CT is identified.  Remote, mild superior endplate compression fracture of T12.  Widely patent central canal and foramina throughout.  Right hydronephrosis as seen on prior studies.    11/27/2018 -  Chemotherapy    The patient had weekly cisplatin with radiation     REVIEW OF SYSTEMS:   Constitutional: Denies fevers, chills  Eyes: Denies blurriness of vision Ears, nose, mouth, throat, and face: Denies mucositis or sore throat Respiratory: Denies cough, dyspnea or wheezes Cardiovascular: Denies palpitation, chest discomfort or lower extremity swelling Skin: Denies abnormal skin rashes Lymphatics: Denies new lymphadenopathy or easy bruising Behavioral/Psych: Mood is stable, no new changes  All other systems were reviewed with the patient and are negative.  I have reviewed the past medical history, past surgical history, social history and family history with the patient and they are unchanged from previous note.  ALLERGIES:  has No Known Allergies.  MEDICATIONS:  Current Outpatient Medications  Medication Sig Dispense Refill  . acetaminophen (TYLENOL) 500 MG tablet Take 1,000 mg by mouth  every 6 (six) hours as needed (pain).    Marland Kitchen ibuprofen (ADVIL) 800 MG tablet Take 200 mg by mouth every 6 (six) hours as needed.    . lidocaine-prilocaine (EMLA) cream Apply to affected area once (Patient taking differently: Apply 1 application topically daily as needed (local anesthetic). Apply to affected area once) 30 g 3  . morphine (MS CONTIN) 30 MG 12 hr tablet Take 1 tablet (30 mg total) by mouth every 12 (twelve) hours. 15 tablet 0  . ondansetron (ZOFRAN) 8 MG tablet Take 1 tablet (8 mg total) by mouth every 8 (eight) hours as needed. Start on the third day after chemotherapy. 90 tablet 1  . prochlorperazine (COMPAZINE) 10 MG tablet Take 1 tablet (10 mg total) by mouth every 6 (six) hours as needed (Nausea or vomiting). 90 tablet 1  . tiZANidine (ZANAFLEX) 2 MG tablet Take 2 mg by mouth at bedtime as needed.      No current facility-administered medications for this visit.     PHYSICAL EXAMINATION: ECOG PERFORMANCE STATUS: 1 - Symptomatic but completely ambulatory  Vitals:   12/10/18 1241  BP: 120/82  Pulse: 98  Resp: 18  Temp: 98 F (36.7 C)  SpO2: 100%   Filed Weights   12/10/18 1241  Weight: 120 lb 12.8 oz (54.8 kg)    GENERAL:alert, no distress and comfortable SKIN: skin color, texture, turgor are normal, no rashes or significant lesions EYES: normal, Conjunctiva are pink and non-injected, sclera clear OROPHARYNX:no exudate, no  erythema and lips, buccal mucosa, and tongue normal  NECK: supple, thyroid normal size, non-tender, without nodularity LYMPH:  no palpable lymphadenopathy in the cervical, axillary or inguinal LUNGS: clear to auscultation and percussion with normal breathing effort HEART: regular rate & rhythm and no murmurs and no lower extremity edema ABDOMEN:abdomen soft, non-tender and normal bowel sounds Musculoskeletal:no cyanosis of digits and no clubbing  NEURO: alert & oriented x 3 with fluent speech, no focal motor/sensory deficits  LABORATORY DATA:   I have reviewed the data as listed    Component Value Date/Time   NA 135 12/09/2018 1334   K 3.7 12/09/2018 1334   CL 98 12/09/2018 1334   CO2 27 12/09/2018 1334   GLUCOSE 98 12/09/2018 1334   BUN 9 12/09/2018 1334   CREATININE 0.76 12/09/2018 1334   CALCIUM 8.7 (L) 12/09/2018 1334   PROT 6.5 12/09/2018 1334   ALBUMIN 3.2 (L) 12/09/2018 1334   AST 22 12/09/2018 1334   ALT 18 12/09/2018 1334   ALKPHOS 70 12/09/2018 1334   BILITOT <0.2 (L) 12/09/2018 1334   GFRNONAA >60 12/09/2018 1334   GFRAA >60 12/09/2018 1334    No results found for: SPEP, UPEP  Lab Results  Component Value Date   WBC 3.2 (L) 12/09/2018   NEUTROABS 2.5 12/09/2018   HGB 10.5 (L) 12/09/2018   HCT 31.5 (L) 12/09/2018   MCV 87.5 12/09/2018   PLT 158 12/09/2018      Chemistry      Component Value Date/Time   NA 135 12/09/2018 1334   K 3.7 12/09/2018 1334   CL 98 12/09/2018 1334   CO2 27 12/09/2018 1334   BUN 9 12/09/2018 1334   CREATININE 0.76 12/09/2018 1334      Component Value Date/Time   CALCIUM 8.7 (L) 12/09/2018 1334   ALKPHOS 70 12/09/2018 1334   AST 22 12/09/2018 1334   ALT 18 12/09/2018 1334   BILITOT <0.2 (L) 12/09/2018 1334       RADIOGRAPHIC STUDIES: I have personally reviewed the radiological images as listed and agreed with the findings in the report. Ct Chest W Contrast  Result Date: 11/17/2018 CLINICAL DATA:  Cervical cancer. EXAM: CT CHEST, ABDOMEN, AND PELVIS WITH CONTRAST TECHNIQUE: Multidetector CT imaging of the chest, abdomen and pelvis was performed following the standard protocol during bolus administration of intravenous contrast. CONTRAST:  38mL OMNIPAQUE IOHEXOL 300 MG/ML  SOLN COMPARISON:  None. FINDINGS: CT CHEST FINDINGS Cardiovascular: The heart size is normal. No substantial pericardial effusion. Mediastinum/Nodes: No mediastinal lymphadenopathy. There is no hilar lymphadenopathy. The esophagus has normal imaging features. There is no axillary lymphadenopathy.  Lungs/Pleura: The central tracheobronchial airways are patent. Biapical pleuroparenchymal scarring evident. No suspicious nodule or mass. No pleural effusion. Musculoskeletal: No worrisome lytic or sclerotic osseous abnormality. Superior endplate compression fracture at T12 appears nonacute. CT ABDOMEN PELVIS FINDINGS Hepatobiliary: No suspicious focal abnormality within the liver parenchyma. There is no evidence for gallstones, gallbladder wall thickening, or pericholecystic fluid. No intrahepatic or extrahepatic biliary dilation. Pancreas: No focal mass lesion. No dilatation of the main duct. No intraparenchymal cyst. No peripancreatic edema. Spleen: No splenomegaly. No focal mass lesion. Adrenals/Urinary Tract: No adrenal nodule or mass. Moderate right hydroureteronephrosis is identified with dilated right ureter down into the pelvis. Left kidney and ureter unremarkable. The urinary bladder appears normal for the degree of distention. Stomach/Bowel: Stomach is unremarkable. No gastric wall thickening. No evidence of outlet obstruction. Duodenum is normally positioned as is the ligament of Treitz. No small  bowel wall thickening. No small bowel dilatation. The terminal ileum is normal. No gross colonic mass. No colonic wall thickening. Vascular/Lymphatic: There is abdominal aortic atherosclerosis without aneurysm. No gastrohepatic or hepato duodenal ligament lymphadenopathy. Abnormal soft tissue is seen around the distal aorta involving the IMA origin (see 73/2). This amorphous abnormal soft tissue extends down along the iliac arteries bilaterally. Reproductive: The endometrial cavity of the uterus is fluid-filled and markedly distended, consistent with cervical obstruction. The cervix is heterogeneous and appears enlarged. Extending posterolaterally to the right from the cervical tissue is a large 6.6 x 3.0 cm necrotic irregular soft tissue mass extending into the right presacral space. The upper vaginal wall  thickening evident. Other: Trace free fluid noted in the pelvis. There is presacral edema evident. Musculoskeletal: No worrisome lytic or sclerotic osseous abnormality. IMPRESSION: 1. Irregular enlargement of the cervix with extension of soft tissue from the cervix into the posterior presacral space along the side of the rectum. Wall thickening in the upper vagina is compatible with tumor involvement. The endometrial cavity of the uterus is markedly expanded and fluid-filled consistent with obstruction. No definite involvement of the posterior bladder wall evident by CT. 2. Moderate right hydroureteronephrosis with ureteral dilatation extending down to the right pelvic sidewall adjacent to the cervix. 3. Abnormal, amorphous soft tissue surrounds the distal aorta and common iliac arteries. Imaging appearance concerning for tumor spread. 4. No evidence for metastatic disease in the chest. 5.  Aortic Atherosclerois (ICD10-170.0) Electronically Signed   By: Misty Stanley M.D.   On: 11/17/2018 16:34   Mr Thoracic Spine W Wo Contrast  Result Date: 11/26/2018 CLINICAL DATA:  Patient with a history of metastatic cervical carcinoma. Focal uptake seen in the central spinal canal at the 11 of T11-12. Question metastatic disease. EXAM: MRI THORACIC WITHOUT AND WITH CONTRAST TECHNIQUE: Multiplanar and multiecho pulse sequences of the thoracic spine were obtained without and with intravenous contrast. CONTRAST:  5 cc Gadavist IV. COMPARISON:  And PET CT scan 11/20/2018. CT chest, abdomen and pelvis 11/17/2018. FINDINGS: MRI THORACIC SPINE FINDINGS Alignment:  Normal. Vertebrae: Mild, remote superior endplate compression fracture of T12 is identified as seen on the prior examinations. Vertebral body height loss is up to approximately 15% anteriorly. Bone marrow signal is normal throughout without evidence of metastatic disease. Cord: Normal size, shape and signal throughout. The conus medullaris is normal in signal and  position at the T12-L1 level. No enhancement of the cord is identified. No lesion within the central spinal canal is present. Paraspinal and other soft tissues: See report of prior cross sectional imaging studies. Right hydronephrosis noted. Disc levels: The central spinal canal and neural foramina are widely patent throughout. Minimal retropulsion off the superior endplate of C58 is noted. IMPRESSION: Negative for metastatic disease. No abnormality to correlate with potential lesion within the spinal canal at T11-12 as seen on prior CT is identified. Remote, mild superior endplate compression fracture of T12. Widely patent central canal and foramina throughout. Right hydronephrosis as seen on prior studies. Electronically Signed   By: Inge Rise M.D.   On: 11/26/2018 14:08   Ct Abdomen Pelvis W Contrast  Result Date: 11/17/2018 CLINICAL DATA:  Cervical cancer. EXAM: CT CHEST, ABDOMEN, AND PELVIS WITH CONTRAST TECHNIQUE: Multidetector CT imaging of the chest, abdomen and pelvis was performed following the standard protocol during bolus administration of intravenous contrast. CONTRAST:  46mL OMNIPAQUE IOHEXOL 300 MG/ML  SOLN COMPARISON:  None. FINDINGS: CT CHEST FINDINGS Cardiovascular: The heart size is normal.  No substantial pericardial effusion. Mediastinum/Nodes: No mediastinal lymphadenopathy. There is no hilar lymphadenopathy. The esophagus has normal imaging features. There is no axillary lymphadenopathy. Lungs/Pleura: The central tracheobronchial airways are patent. Biapical pleuroparenchymal scarring evident. No suspicious nodule or mass. No pleural effusion. Musculoskeletal: No worrisome lytic or sclerotic osseous abnormality. Superior endplate compression fracture at T12 appears nonacute. CT ABDOMEN PELVIS FINDINGS Hepatobiliary: No suspicious focal abnormality within the liver parenchyma. There is no evidence for gallstones, gallbladder wall thickening, or pericholecystic fluid. No intrahepatic  or extrahepatic biliary dilation. Pancreas: No focal mass lesion. No dilatation of the main duct. No intraparenchymal cyst. No peripancreatic edema. Spleen: No splenomegaly. No focal mass lesion. Adrenals/Urinary Tract: No adrenal nodule or mass. Moderate right hydroureteronephrosis is identified with dilated right ureter down into the pelvis. Left kidney and ureter unremarkable. The urinary bladder appears normal for the degree of distention. Stomach/Bowel: Stomach is unremarkable. No gastric wall thickening. No evidence of outlet obstruction. Duodenum is normally positioned as is the ligament of Treitz. No small bowel wall thickening. No small bowel dilatation. The terminal ileum is normal. No gross colonic mass. No colonic wall thickening. Vascular/Lymphatic: There is abdominal aortic atherosclerosis without aneurysm. No gastrohepatic or hepato duodenal ligament lymphadenopathy. Abnormal soft tissue is seen around the distal aorta involving the IMA origin (see 73/2). This amorphous abnormal soft tissue extends down along the iliac arteries bilaterally. Reproductive: The endometrial cavity of the uterus is fluid-filled and markedly distended, consistent with cervical obstruction. The cervix is heterogeneous and appears enlarged. Extending posterolaterally to the right from the cervical tissue is a large 6.6 x 3.0 cm necrotic irregular soft tissue mass extending into the right presacral space. The upper vaginal wall thickening evident. Other: Trace free fluid noted in the pelvis. There is presacral edema evident. Musculoskeletal: No worrisome lytic or sclerotic osseous abnormality. IMPRESSION: 1. Irregular enlargement of the cervix with extension of soft tissue from the cervix into the posterior presacral space along the side of the rectum. Wall thickening in the upper vagina is compatible with tumor involvement. The endometrial cavity of the uterus is markedly expanded and fluid-filled consistent with obstruction.  No definite involvement of the posterior bladder wall evident by CT. 2. Moderate right hydroureteronephrosis with ureteral dilatation extending down to the right pelvic sidewall adjacent to the cervix. 3. Abnormal, amorphous soft tissue surrounds the distal aorta and common iliac arteries. Imaging appearance concerning for tumor spread. 4. No evidence for metastatic disease in the chest. 5.  Aortic Atherosclerois (ICD10-170.0) Electronically Signed   By: Misty Stanley M.D.   On: 11/17/2018 16:34   Nm Pet Image Initial (pi) Skull Base To Thigh  Result Date: 11/20/2018 CLINICAL DATA:  Initial treatment strategy for cervical cancer. EXAM: NUCLEAR MEDICINE PET SKULL BASE TO THIGH TECHNIQUE: 6.2 mCi F-18 FDG was injected intravenously. Full-ring PET imaging was performed from the skull base to thigh after the radiotracer. CT data was obtained and used for attenuation correction and anatomic localization. Fasting blood glucose: 94 mg/dl COMPARISON:  CT abdomen/pelvis 11/17/2018 FINDINGS: Mediastinal blood pool activity: SUV max 2.1 NECK: No hypermetabolic lymph nodes in the neck. Areas of FDG uptake identified in the maxilla and mandible, potentially related to periodontal disease. Incidental CT findings: none CHEST: 7 mm short axis AP window lymph node (59/4) shows low level uptake with SUV max = 4.2. Foci of hypermetabolic FDG accumulation are identified in both hilar regions without underlying lymph nodes evident on noncontrast CT imaging. SUV max = 3.6 right hilum. Incidental CT  findings: Biapical pleuroparenchymal scarring evident. No suspicious pulmonary nodule or mass. No pleural effusion. ABDOMEN/PELVIS: No abnormal hypermetabolic activity within the liver, pancreas, adrenal glands, or spleen. The abnormal para-aortic soft tissue seen just proximal to the bifurcation on the previous exam is hypermetabolic with SUV max = 4.5. 8 mm short axis left pelvic sidewall lymph node (124/5) is hypermetabolic with SUV max  = 6.2. 7 x 11 mm nodule posterior right pelvis (809/9) is hypermetabolic with SUV max = 4.3. XR cervical direct extension of tumor posterolaterally on the right is seen previously is hypermetabolic with SUV max = 83.3. Incidental CT findings: Similar appearance milder it right hydroureteronephrosis with ureteral obstruction in the region of the cervical lesion. Asymmetric bladder wall thickening posteriorly on the right (167/4) is more prominent on the current study than previously. Radiotracer in the bladder urine obscures assessment of the bladder wall. SKELETON: No focal hypermetabolic activity to suggest skeletal metastasis. Focal hypermetabolism identified in the spinal canal at the level of T11-12 without underlying lesion evident on noncontrast CT. SUV max = 3.2. Incidental CT findings: none IMPRESSION: 1. Cervical lesion with direct extra cervical extension posterolaterally on the right is markedly hypermetabolic consistent with known neoplasm. 2. 7 x 11 mm posterior right pelvic irregular nodule is hypermetabolic consistent with metastatic disease. 3. Para-aortic soft tissue in the region of the bifurcation and 8 mm short axis left pelvic sidewall lymph node show marked hypermetabolism, consistent with metastatic involvement. 4. Low level FDG uptake identified in a unenlarged AP window lymph node of the mediastinum with focal FDG uptake identified in each hilum. While no underlying hilar lymph nodes evident on this noncontrast CT exam, tiny lymph nodes were visible in the hilar regions on diagnostic CT of 11/17/2018. While this may be reactive given the low level uptake, metastatic disease cannot be definitively excluded. 5. Focus of hypermetabolic activity in the spinal canal at the T11-12 level. No underlying mass lesion evident on noncontrast CT imaging. Thoracic spine MRI without and with contrast may be warranted to further evaluate. 6. Stable moderate right hydroureteronephrosis. Electronically Signed    By: Misty Stanley M.D.   On: 11/20/2018 17:24   Ir Imaging Guided Port Insertion  Result Date: 11/25/2018 CLINICAL DATA:  Cervical carcinoma and need for porta cath to begin chemotherapy. EXAM: IMPLANTED PORT A CATH PLACEMENT WITH ULTRASOUND AND FLUOROSCOPIC GUIDANCE ANESTHESIA/SEDATION: 2.0 mg IV Versed; 100 mcg IV Fentanyl Total Moderate Sedation Time:  30 minutes The patient's level of consciousness and physiologic status were continuously monitored during the procedure by Radiology nursing. Additional Medications: 2 g IV Ancef. FLUOROSCOPY TIME:  12 seconds.  0.4 mGy. PROCEDURE: The procedure, risks, benefits, and alternatives were explained to the patient. Questions regarding the procedure were encouraged and answered. The patient understands and consents to the procedure. A time-out was performed prior to initiating the procedure. Ultrasound was utilized to confirm patency of the right internal jugular vein. The right neck and chest were prepped with chlorhexidine in a sterile fashion, and a sterile drape was applied covering the operative field. Maximum barrier sterile technique with sterile gowns and gloves were used for the procedure. Local anesthesia was provided with 1% lidocaine. After creating a small venotomy incision, a 21 gauge needle was advanced into the right internal jugular vein under direct, real-time ultrasound guidance. Ultrasound image documentation was performed. After securing guidewire access, an 8 Fr dilator was placed. A J-wire was kinked to measure appropriate catheter length. A subcutaneous port pocket was then created  along the upper chest wall utilizing sharp and blunt dissection. Portable cautery was utilized. The pocket was irrigated with sterile saline. A single lumen power injectable port was chosen for placement. The 8 Fr catheter was tunneled from the port pocket site to the venotomy incision. The port was placed in the pocket. External catheter was trimmed to  appropriate length based on guidewire measurement. At the venotomy, an 8 Fr peel-away sheath was placed over a guidewire. The catheter was then placed through the sheath and the sheath removed. Final catheter positioning was confirmed and documented with a fluoroscopic spot image. The port was accessed with a needle and aspirated and flushed with heparinized saline. The access needle was removed. The venotomy and port pocket incisions were closed with subcutaneous 3-0 Monocryl and subcuticular 4-0 Vicryl. Dermabond was applied to both incisions. COMPLICATIONS: COMPLICATIONS None FINDINGS: After catheter placement, the tip lies at the cavo-atrial junction. The catheter aspirates normally and is ready for immediate use. IMPRESSION: Placement of single lumen port a cath via right internal jugular vein. The catheter tip lies at the cavo-atrial junction. A power injectable port a cath was placed and is ready for immediate use. Electronically Signed   By: Aletta Edouard M.D.   On: 11/25/2018 10:49    All questions were answered. The patient knows to call the clinic with any problems, questions or concerns. No barriers to learning was detected.  I spent 30 minutes counseling the patient face to face. The total time spent in the appointment was 40 minutes and more than 50% was on counseling and review of test results  Heath Lark, MD 12/10/2018 2:23 PM

## 2018-12-10 NOTE — Assessment & Plan Note (Signed)
Renal function appears to be improving We will observe only for now

## 2018-12-10 NOTE — Assessment & Plan Note (Signed)
She has frequent nausea without vomiting and have lost some weight I recommend premedication Zofran before radiation treatment She is advised not to take Zofran on the day of the chemotherapy and verbalized understanding

## 2018-12-10 NOTE — Assessment & Plan Note (Signed)
She tolerated treatment fairly well except for some weight loss, tinnitus/hearing changes and progressive pancytopenia Her renal function has improved Overall, I think she is responding to therapy I plan to change the dose a little bit due to the side effects I recommend increase oral intake as tolerated I will see her again next week for further follow-up

## 2018-12-10 NOTE — Assessment & Plan Note (Signed)
She has mild progressive pancytopenia due to treatment I plan minor dose adjustment We discussed neutropenic precaution

## 2018-12-10 NOTE — Assessment & Plan Note (Signed)
She has minor loose bowel movement likely secondary to side effects of treatment She will increase oral intake as tolerated

## 2018-12-10 NOTE — Assessment & Plan Note (Signed)
She is doing well in this regard and attempting to wean herself off We discussed the importance of nicotine cessation

## 2018-12-10 NOTE — Assessment & Plan Note (Signed)
She has some symptoms of tinnitus likely secondary to side effects of cisplatin I plan minor dose adjustment because of this

## 2018-12-11 ENCOUNTER — Other Ambulatory Visit: Payer: Self-pay

## 2018-12-11 ENCOUNTER — Inpatient Hospital Stay: Payer: BLUE CROSS/BLUE SHIELD

## 2018-12-11 ENCOUNTER — Ambulatory Visit
Admission: RE | Admit: 2018-12-11 | Discharge: 2018-12-11 | Disposition: A | Discharge status: Home / Self Care | Payer: BLUE CROSS/BLUE SHIELD | Source: Ambulatory Visit | Attending: Radiation Oncology | Admitting: Radiation Oncology

## 2018-12-11 VITALS — BP 125/66 | HR 89 | Temp 98.0°F | Resp 17

## 2018-12-11 DIAGNOSIS — C531 Malignant neoplasm of exocervix: Secondary | ICD-10-CM

## 2018-12-11 DIAGNOSIS — Z5111 Encounter for antineoplastic chemotherapy: Secondary | ICD-10-CM | POA: Diagnosis not present

## 2018-12-11 MED ORDER — SODIUM CHLORIDE 0.9 % IV SOLN
32.0000 mg/m2 | Freq: Once | INTRAVENOUS | Status: AC
  Administered 2018-12-11: 52 mg via INTRAVENOUS
  Filled 2018-12-11: qty 52

## 2018-12-11 MED ORDER — PALONOSETRON HCL INJECTION 0.25 MG/5ML
0.2500 mg | Freq: Once | INTRAVENOUS | Status: AC
  Administered 2018-12-11: 11:00:00 0.25 mg via INTRAVENOUS

## 2018-12-11 MED ORDER — SODIUM CHLORIDE 0.9 % IV SOLN
Freq: Once | INTRAVENOUS | Status: AC
  Administered 2018-12-11: 08:00:00 via INTRAVENOUS
  Filled 2018-12-11: qty 250

## 2018-12-11 MED ORDER — POTASSIUM CHLORIDE 2 MEQ/ML IV SOLN
Freq: Once | INTRAVENOUS | Status: AC
  Administered 2018-12-11: 08:00:00 via INTRAVENOUS
  Filled 2018-12-11: qty 10

## 2018-12-11 MED ORDER — SODIUM CHLORIDE 0.9 % IV SOLN
Freq: Once | INTRAVENOUS | Status: AC
  Administered 2018-12-11: 11:00:00 via INTRAVENOUS
  Filled 2018-12-11: qty 5

## 2018-12-11 MED ORDER — SODIUM CHLORIDE 0.9% FLUSH
10.0000 mL | INTRAVENOUS | Status: DC | PRN
  Administered 2018-12-11: 15:00:00 10 mL
  Filled 2018-12-11: qty 10

## 2018-12-11 MED ORDER — HEPARIN SOD (PORK) LOCK FLUSH 100 UNIT/ML IV SOLN
500.0000 [IU] | Freq: Once | INTRAVENOUS | Status: AC | PRN
  Administered 2018-12-11: 500 [IU]
  Filled 2018-12-11: qty 5

## 2018-12-11 MED ORDER — PALONOSETRON HCL INJECTION 0.25 MG/5ML
INTRAVENOUS | Status: AC
  Filled 2018-12-11: qty 5

## 2018-12-11 NOTE — Patient Instructions (Signed)
Coronavirus (COVID-19) Are you at risk?  Are you at risk for the Coronavirus (COVID-19)?  To be considered HIGH RISK for Coronavirus (COVID-19), you have to meet the following criteria:  . Traveled to [place], [place], [place], [place] or [place]; or in the [place] to [place], [place], [place], or [place]; and have fever, cough, and shortness of breath within the last 2 weeks of travel OR . Been in close contact with a person diagnosed with COVID-19 within the last 2 weeks and have fever, cough, and shortness of breath . IF YOU DO NOT MEET THESE CRITERIA, YOU ARE CONSIDERED LOW RISK FOR COVID-19.  What to do if you are HIGH RISK for COVID-19?  . If you are having a medical emergency, call 911. . Seek medical care right away. Before you go to a doctor's office, urgent care or emergency department, call ahead and tell them about your recent travel, contact with someone diagnosed with COVID-19, and your symptoms. You should receive instructions from your physician's office regarding next steps of care.  . When you arrive at healthcare provider, tell the healthcare staff immediately you have returned from visiting [place], [place], [place], [place] or [place]; or traveled in the [place] to [place], [place], [place], or [place]; in the last two weeks or you have been in close contact with a person diagnosed with COVID-19 in the last 2 weeks.   . Tell the health care staff about your symptoms: fever, cough and shortness of breath. . After you have been seen by a medical provider, you will be either: o Tested for (COVID-19) and discharged home on quarantine except to seek medical care if symptoms worsen, and asked to  - Stay home and avoid contact with others until you get your results (4-5 days)  - Avoid travel on public transportation if possible (such as bus, train, or airplane) or o Sent to the Emergency Department by EMS for evaluation, COVID-19 testing, and possible  admission depending on your condition and test results.  What to do if you are LOW RISK for COVID-19?  Reduce your risk of any infection by using the same precautions used for avoiding the common cold or flu:  . Wash your hands often with soap and warm water for at least 20 seconds.  If soap and water are not readily available, use an alcohol-based hand sanitizer with at least 60% alcohol.  . If coughing or sneezing, cover your mouth and nose by coughing or sneezing into the elbow areas of your shirt or coat, into a tissue or into your sleeve (not your hands). . Avoid shaking hands with others and consider head nods or verbal greetings only. . Avoid touching your eyes, nose, or mouth with unwashed hands.  . Avoid close contact with people who are sick. . Avoid places or events with large numbers of people in one location, like concerts or sporting events. . Carefully consider travel plans you have or are making. . If you are planning any travel outside or inside the [place], visit the CDC's Travelers' Health webpage for the latest health notices. . If you have some symptoms but not all symptoms, continue to monitor at home and seek medical attention if your symptoms worsen. . If you are having a medical emergency, call 911.   ADDITIONAL HEALTHCARE OPTIONS FOR PATIENTS  [facility] / e-Visit: [email or web address]         [facility]: [phone]  [place]   Urgent Care: [facility]: [facility] Discharge Instructions for Patients Receiving Chemotherapy  Today you received the following chemotherapy agents: Cisplatin (Platinol)  To help prevent nausea and vomiting after your treatment, we encourage you to take your nausea medication as directed.    If you develop nausea and vomiting that is not controlled by your nausea medication, call the clinic.    BELOW ARE SYMPTOMS THAT SHOULD BE REPORTED IMMEDIATELY:  *FEVER GREATER THAN 100.5 F  *CHILLS WITH OR WITHOUT FEVER  NAUSEA AND VOMITING THAT IS NOT CONTROLLED WITH YOUR NAUSEA MEDICATION  *UNUSUAL SHORTNESS OF BREATH  *UNUSUAL BRUISING OR BLEEDING  TENDERNESS IN MOUTH AND THROAT WITH OR WITHOUT PRESENCE OF ULCERS  *URINARY PROBLEMS  *BOWEL PROBLEMS  UNUSUAL RASH Items with * indicate a potential emergency and should be followed up as soon as possible.  Feel free to call the clinic should you have any questions or concerns. The clinic phone number is [phone].  Please show the [facility] at check-in to the Emergency Department and triage nurse.

## 2018-12-14 ENCOUNTER — Ambulatory Visit
Admission: RE | Admit: 2018-12-14 | Discharge: 2018-12-14 | Disposition: A | Discharge status: Home / Self Care | Payer: BLUE CROSS/BLUE SHIELD | Source: Ambulatory Visit | Attending: Radiation Oncology | Admitting: Radiation Oncology

## 2018-12-14 ENCOUNTER — Other Ambulatory Visit: Payer: Self-pay

## 2018-12-14 DIAGNOSIS — Z5111 Encounter for antineoplastic chemotherapy: Secondary | ICD-10-CM | POA: Diagnosis not present

## 2018-12-15 ENCOUNTER — Other Ambulatory Visit: Payer: Self-pay | Admitting: Radiation Oncology

## 2018-12-15 ENCOUNTER — Other Ambulatory Visit: Payer: Self-pay

## 2018-12-15 ENCOUNTER — Ambulatory Visit
Admission: RE | Admit: 2018-12-15 | Discharge: 2018-12-15 | Disposition: A | Discharge status: Home / Self Care | Payer: BLUE CROSS/BLUE SHIELD | Source: Ambulatory Visit | Attending: Radiation Oncology | Admitting: Radiation Oncology

## 2018-12-15 DIAGNOSIS — Z5111 Encounter for antineoplastic chemotherapy: Secondary | ICD-10-CM | POA: Diagnosis not present

## 2018-12-15 MED ORDER — MORPHINE SULFATE 15 MG PO TABS: 15.0000 mg | ORAL_TABLET | ORAL | 0 refills | Status: DC | PRN

## 2018-12-15 MED ORDER — DIPHENOXYLATE-ATROPINE 2.5-0.025 MG PO TABS: 1.0000 | ORAL_TABLET | Freq: Four times a day (QID) | ORAL | 0 refills | Status: DC | PRN

## 2018-12-16 ENCOUNTER — Other Ambulatory Visit: Payer: Self-pay

## 2018-12-16 ENCOUNTER — Inpatient Hospital Stay: Payer: BLUE CROSS/BLUE SHIELD

## 2018-12-16 ENCOUNTER — Ambulatory Visit
Admission: RE | Admit: 2018-12-16 | Discharge: 2018-12-16 | Disposition: A | Discharge status: Home / Self Care | Payer: BLUE CROSS/BLUE SHIELD | Source: Ambulatory Visit | Attending: Radiation Oncology | Admitting: Radiation Oncology

## 2018-12-16 DIAGNOSIS — Z5111 Encounter for antineoplastic chemotherapy: Secondary | ICD-10-CM | POA: Diagnosis not present

## 2018-12-16 DIAGNOSIS — C531 Malignant neoplasm of exocervix: Secondary | ICD-10-CM

## 2018-12-16 LAB — CMP (CANCER CENTER ONLY)
ALT: 32 U/L (ref 0–44)
AST: 29 U/L (ref 15–41)
Albumin: 3.8 g/dL (ref 3.5–5.0)
Alkaline Phosphatase: 81 U/L (ref 38–126)
Anion gap: 10 (ref 5–15)
BUN: 17 mg/dL (ref 6–20)
CO2: 20 mmol/L — ABNORMAL LOW (ref 22–32)
Calcium: 9.1 mg/dL (ref 8.9–10.3)
Chloride: 103 mmol/L (ref 98–111)
Creatinine: 0.82 mg/dL (ref 0.44–1.00)
GFR, Est AFR Am: >60 mL/min (ref >60)
GFR, Estimated: >60 mL/min (ref >60)
Glucose, Bld: 113 mg/dL — ABNORMAL HIGH (ref 70–99)
Potassium: 4.2 mmol/L (ref 3.5–5.1)
Sodium: 133 mmol/L — ABNORMAL LOW (ref 135–145)
Total Bilirubin: 0.3 mg/dL (ref 0.3–1.2)
Total Protein: 7.3 g/dL (ref 6.5–8.1)

## 2018-12-16 LAB — CBC WITH DIFFERENTIAL/PLATELET
Abs Immature Granulocytes: 0 10*3/uL (ref 0.00–0.07)
Basophils Absolute: 0 10*3/uL (ref 0.0–0.1)
Basophils Relative: 0 %
Eosinophils Absolute: 0.2 10*3/uL (ref 0.0–0.5)
Eosinophils Relative: 5 %
HCT: 33.8 % — ABNORMAL LOW (ref 36.0–46.0)
Hemoglobin: 11.6 g/dL — ABNORMAL LOW (ref 12.0–15.0)
Immature Granulocytes: 0 %
Lymphocytes Relative: 7 %
Lymphs Abs: 0.2 10*3/uL — ABNORMAL LOW (ref 0.7–4.0)
MCH: 29.4 pg (ref 26.0–34.0)
MCHC: 34.3 g/dL (ref 30.0–36.0)
MCV: 85.8 fL (ref 80.0–100.0)
Monocytes Absolute: 0.4 10*3/uL (ref 0.1–1.0)
Monocytes Relative: 11 %
Neutro Abs: 2.5 10*3/uL (ref 1.7–7.7)
Neutrophils Relative %: 77 %
Platelets: 73 10*3/uL — ABNORMAL LOW (ref 150–400)
RBC: 3.94 MIL/uL (ref 3.87–5.11)
RDW: 12.2 % (ref 11.5–15.5)
WBC: 3.3 10*3/uL — ABNORMAL LOW (ref 4.0–10.5)
nRBC: 0 % (ref 0.0–0.2)

## 2018-12-16 LAB — MAGNESIUM: Magnesium: 1.8 mg/dL (ref 1.7–2.4)

## 2018-12-17 ENCOUNTER — Other Ambulatory Visit: Payer: Self-pay

## 2018-12-17 ENCOUNTER — Encounter: Payer: Self-pay | Admitting: Hematology and Oncology

## 2018-12-17 ENCOUNTER — Ambulatory Visit
Admission: RE | Admit: 2018-12-17 | Discharge: 2018-12-17 | Disposition: A | Discharge status: Home / Self Care | Payer: BLUE CROSS/BLUE SHIELD | Source: Ambulatory Visit | Attending: Radiation Oncology | Admitting: Radiation Oncology

## 2018-12-17 ENCOUNTER — Inpatient Hospital Stay (HOSPITAL_BASED_OUTPATIENT_CLINIC_OR_DEPARTMENT_OTHER): Payer: BLUE CROSS/BLUE SHIELD | Admitting: Hematology and Oncology

## 2018-12-17 DIAGNOSIS — C531 Malignant neoplasm of exocervix: Secondary | ICD-10-CM | POA: Diagnosis not present

## 2018-12-17 DIAGNOSIS — D61810 Antineoplastic chemotherapy induced pancytopenia: Secondary | ICD-10-CM

## 2018-12-17 DIAGNOSIS — Z79899 Other long term (current) drug therapy: Secondary | ICD-10-CM

## 2018-12-17 DIAGNOSIS — R110 Nausea: Secondary | ICD-10-CM | POA: Diagnosis not present

## 2018-12-17 DIAGNOSIS — Z720 Tobacco use: Secondary | ICD-10-CM | POA: Diagnosis not present

## 2018-12-17 DIAGNOSIS — D61818 Other pancytopenia: Secondary | ICD-10-CM

## 2018-12-17 DIAGNOSIS — Z5111 Encounter for antineoplastic chemotherapy: Secondary | ICD-10-CM | POA: Diagnosis not present

## 2018-12-17 NOTE — Assessment & Plan Note (Signed)
This is due to recent treatment She is not symptomatic We will hold treatment today I plan to adjust the dose of her chemotherapy in the future We will check her blood again next week

## 2018-12-17 NOTE — Assessment & Plan Note (Signed)
She has lost a lot of weight since last time I saw her due to poor appetite She is not symptomatic from pancytopenia Due to significant drop of her platelet count, it is not safe to proceed with treatment tomorrow She understand the rationale of withholding treatment this week She will return next week for blood count monitoring and chemotherapy again if her blood count improves

## 2018-12-17 NOTE — Assessment & Plan Note (Signed)
She denies excessive nausea with regular schedule antiemetics She will continue the same

## 2018-12-17 NOTE — Progress Notes (Signed)
[place] OFFICE PROGRESS NOTE  Patient Care Team: Marguerita Merles, MD as PCP - General (Family Medicine) Gae Dry, MD as Referring Physician (Obstetrics and Gynecology) Clent Jacks, RN as Registered Nurse Awanda Mink, Craige Cotta, RN as Oncology Nurse Navigator (Oncology)  ASSESSMENT & PLAN:  Cervical ca [facility]) She has lost a lot of weight since last time I saw her due to poor appetite She is not symptomatic from pancytopenia Due to significant drop of her platelet count, it is not safe to proceed with treatment tomorrow She understand the rationale of withholding treatment this week She will return next week for blood count monitoring and chemotherapy again if her blood count improves  Pancytopenia, acquired [facility]) This is due to recent treatment She is not symptomatic We will hold treatment today I plan to adjust the dose of her chemotherapy in the future We will check her blood again next week  Tobacco abuse She is attempting to quit smoking I congratulated her efforts  Nausea without vomiting She denies excessive nausea with regular schedule antiemetics She will continue the same   No orders of the defined types were placed in this encounter.   INTERVAL HISTORY: Please see below for problem oriented charting. She returns to be seen prior to cycle 4 of treatment Since last time I saw her, she have lost some weight due to poor appetite She denies excessive nausea or diarrhea She is attempting to quit smoking The patient denies any recent signs or symptoms of bleeding such as spontaneous epistaxis, hematuria or hematochezia.   SUMMARY OF ONCOLOGIC HISTORY:   Cervical ca ([place])   11/03/2018 Initial Diagnosis    She presented with postmenopausal bleeding since 2019 (at least 1 year) and weight loss. Had prior abnormal PAP smear, last done in 2010    11/04/2018 Pathology Results    Endometrium, biopsy - INVASIVE MODERATELY DIFFERENTIATED SQUAMOUS CELL  CARCINOMA. SEE NOTE Diagnosis Note Squamous cell carcinoma arises in a background of high-grade dysplasia with possible glandular involvement. Tumor cells are positive for CK5/6, consistent with the above diagnosis. Immunohistochemical stain for p16 is diffusely positive in the tumor cells.    11/17/2018 Cancer Staging    Staging form: Cervix Uteri, AJCC 8th Edition - Clinical: FIGO Stage IIIB (cT3b, cN0, cM0) - Signed by Heath Lark, MD on 11/17/2018    11/20/2018 PET scan    IMPRESSION: 1. Cervical lesion with direct extra cervical extension posterolaterally on the right is markedly hypermetabolic consistent with known neoplasm. 2. 7 x 11 mm posterior right pelvic irregular nodule is hypermetabolic consistent with metastatic disease. 3. Para-aortic soft tissue in the region of the bifurcation and 8 mm short axis left pelvic sidewall lymph node show marked hypermetabolism, consistent with metastatic involvement. 4. Low level FDG uptake identified in a unenlarged AP window lymph node of the mediastinum with focal FDG uptake identified in each hilum. While no underlying hilar lymph nodes evident on this noncontrast CT exam, tiny lymph nodes were visible in the hilar regions on diagnostic CT of 11/17/2018. While this may be reactive given the low level uptake, metastatic disease cannot be definitively excluded. 5. Focus of hypermetabolic activity in the spinal canal at the T11-12 level. No underlying mass lesion evident on noncontrast CT imaging. Thoracic spine MRI without and with contrast may be warranted to further evaluate. 6. Stable moderate right hydroureteronephrosis.    11/25/2018 Procedure    Placement of single lumen port a cath via right internal jugular vein.  The catheter tip lies at the cavo-atrial junction. A power injectable port a cath was placed and is ready for immediate use.     11/26/2018 Imaging    MRI spine Negative for metastatic disease. No abnormality to correlate with  potential lesion within the spinal canal at T11-12 as seen on prior CT is identified.  Remote, mild superior endplate compression fracture of T12.  Widely patent central canal and foramina throughout.  Right hydronephrosis as seen on prior studies.    11/27/2018 -  Chemotherapy    The patient had weekly cisplatin with radiation     REVIEW OF SYSTEMS:   Constitutional: Denies fevers, chills or abnormal weight loss Eyes: Denies blurriness of vision Ears, nose, mouth, throat, and face: Denies mucositis or sore throat Respiratory: Denies cough, dyspnea or wheezes Cardiovascular: Denies palpitation, chest discomfort or lower extremity swelling Skin: Denies abnormal skin rashes Lymphatics: Denies new lymphadenopathy or easy bruising Neurological:Denies numbness, tingling or new weaknesses Behavioral/Psych: Mood is stable, no new changes  All other systems were reviewed with the patient and are negative.  I have reviewed the past medical history, past surgical history, social history and family history with the patient and they are unchanged from previous note.  ALLERGIES:  has No Known Allergies.  MEDICATIONS:  Current Outpatient Medications  Medication Sig Dispense Refill  . acetaminophen (TYLENOL) 500 MG tablet Take 1,000 mg by mouth every 6 (six) hours as needed (pain).    Marland Kitchen diphenoxylate-atropine (LOMOTIL) 2.5-0.025 MG tablet Take 1 tablet by mouth 4 (four) times daily as needed for diarrhea or loose stools. 30 tablet 0  . lidocaine-prilocaine (EMLA) cream Apply to affected area once (Patient taking differently: Apply 1 application topically daily as needed (local anesthetic). Apply to affected area once) 30 g 3  . morphine (MSIR) 15 MG tablet Take 1 tablet (15 mg total) by mouth every 4 (four) hours as needed for severe pain. 30 tablet 0  . ondansetron (ZOFRAN) 8 MG tablet Take 1 tablet (8 mg total) by mouth every 8 (eight) hours as needed. Start on the third day after  chemotherapy. 90 tablet 1  . prochlorperazine (COMPAZINE) 10 MG tablet Take 1 tablet (10 mg total) by mouth every 6 (six) hours as needed (Nausea or vomiting). 90 tablet 1  . tiZANidine (ZANAFLEX) 2 MG tablet Take 2 mg by mouth at bedtime as needed.      No current facility-administered medications for this visit.     PHYSICAL EXAMINATION: ECOG PERFORMANCE STATUS: 1 - Symptomatic but completely ambulatory  Vitals:   12/17/18 1358  BP: 115/71  Pulse: 94  Resp: 18  Temp: 99.1 F (37.3 C)  SpO2: 97%   Filed Weights   12/17/18 1358  Weight: 114 lb 9.6 oz (52 kg)    GENERAL:alert, no distress and comfortable Musculoskeletal:no cyanosis of digits and no clubbing  NEURO: alert & oriented x 3 with fluent speech, no focal motor/sensory deficits  LABORATORY DATA:  I have reviewed the data as listed    Component Value Date/Time   NA 133 (L) 12/16/2018 1247   K 4.2 12/16/2018 1247   CL 103 12/16/2018 1247   CO2 20 (L) 12/16/2018 1247   GLUCOSE 113 (H) 12/16/2018 1247   BUN 17 12/16/2018 1247   CREATININE 0.82 12/16/2018 1247   CALCIUM 9.1 12/16/2018 1247   PROT 7.3 12/16/2018 1247   ALBUMIN 3.8 12/16/2018 1247   AST 29 12/16/2018 1247   ALT 32 12/16/2018 1247   ALKPHOS 81  12/16/2018 1247   BILITOT 0.3 12/16/2018 1247   GFRNONAA >60 12/16/2018 1247   GFRAA >60 12/16/2018 1247    No results found for: SPEP, UPEP  Lab Results  Component Value Date   WBC 3.3 (L) 12/16/2018   NEUTROABS 2.5 12/16/2018   HGB 11.6 (L) 12/16/2018   HCT 33.8 (L) 12/16/2018   MCV 85.8 12/16/2018   PLT 73 (L) 12/16/2018      Chemistry      Component Value Date/Time   NA 133 (L) 12/16/2018 1247   K 4.2 12/16/2018 1247   CL 103 12/16/2018 1247   CO2 20 (L) 12/16/2018 1247   BUN 17 12/16/2018 1247   CREATININE 0.82 12/16/2018 1247      Component Value Date/Time   CALCIUM 9.1 12/16/2018 1247   ALKPHOS 81 12/16/2018 1247   AST 29 12/16/2018 1247   ALT 32 12/16/2018 1247   BILITOT 0.3  12/16/2018 1247       RADIOGRAPHIC STUDIES: I have personally reviewed the radiological images as listed and agreed with the findings in the report. Ct Chest W Contrast  Result Date: 11/17/2018 CLINICAL DATA:  Cervical cancer. EXAM: CT CHEST, ABDOMEN, AND PELVIS WITH CONTRAST TECHNIQUE: Multidetector CT imaging of the chest, abdomen and pelvis was performed following the standard protocol during bolus administration of intravenous contrast. CONTRAST:  13mL OMNIPAQUE IOHEXOL 300 MG/ML  SOLN COMPARISON:  None. FINDINGS: CT CHEST FINDINGS Cardiovascular: The heart size is normal. No substantial pericardial effusion. Mediastinum/Nodes: No mediastinal lymphadenopathy. There is no hilar lymphadenopathy. The esophagus has normal imaging features. There is no axillary lymphadenopathy. Lungs/Pleura: The central tracheobronchial airways are patent. Biapical pleuroparenchymal scarring evident. No suspicious nodule or mass. No pleural effusion. Musculoskeletal: No worrisome lytic or sclerotic osseous abnormality. Superior endplate compression fracture at T12 appears nonacute. CT ABDOMEN PELVIS FINDINGS Hepatobiliary: No suspicious focal abnormality within the liver parenchyma. There is no evidence for gallstones, gallbladder wall thickening, or pericholecystic fluid. No intrahepatic or extrahepatic biliary dilation. Pancreas: No focal mass lesion. No dilatation of the main duct. No intraparenchymal cyst. No peripancreatic edema. Spleen: No splenomegaly. No focal mass lesion. Adrenals/Urinary Tract: No adrenal nodule or mass. Moderate right hydroureteronephrosis is identified with dilated right ureter down into the pelvis. Left kidney and ureter unremarkable. The urinary bladder appears normal for the degree of distention. Stomach/Bowel: Stomach is unremarkable. No gastric wall thickening. No evidence of outlet obstruction. Duodenum is normally positioned as is the ligament of Treitz. No small bowel wall thickening. No  small bowel dilatation. The terminal ileum is normal. No gross colonic mass. No colonic wall thickening. Vascular/Lymphatic: There is abdominal aortic atherosclerosis without aneurysm. No gastrohepatic or hepato duodenal ligament lymphadenopathy. Abnormal soft tissue is seen around the distal aorta involving the IMA origin (see 73/2). This amorphous abnormal soft tissue extends down along the iliac arteries bilaterally. Reproductive: The endometrial cavity of the uterus is fluid-filled and markedly distended, consistent with cervical obstruction. The cervix is heterogeneous and appears enlarged. Extending posterolaterally to the right from the cervical tissue is a large 6.6 x 3.0 cm necrotic irregular soft tissue mass extending into the right presacral space. The upper vaginal wall thickening evident. Other: Trace free fluid noted in the pelvis. There is presacral edema evident. Musculoskeletal: No worrisome lytic or sclerotic osseous abnormality. IMPRESSION: 1. Irregular enlargement of the cervix with extension of soft tissue from the cervix into the posterior presacral space along the side of the rectum. Wall thickening in the upper vagina is compatible with  tumor involvement. The endometrial cavity of the uterus is markedly expanded and fluid-filled consistent with obstruction. No definite involvement of the posterior bladder wall evident by CT. 2. Moderate right hydroureteronephrosis with ureteral dilatation extending down to the right pelvic sidewall adjacent to the cervix. 3. Abnormal, amorphous soft tissue surrounds the distal aorta and common iliac arteries. Imaging appearance concerning for tumor spread. 4. No evidence for metastatic disease in the chest. 5.  Aortic Atherosclerois (ICD10-170.0) Electronically Signed   By: Misty Stanley M.D.   On: 11/17/2018 16:34   Mr Thoracic Spine W Wo Contrast  Result Date: 11/26/2018 CLINICAL DATA:  Patient with a history of metastatic cervical carcinoma. Focal  uptake seen in the central spinal canal at the 11 of T11-12. Question metastatic disease. EXAM: MRI THORACIC WITHOUT AND WITH CONTRAST TECHNIQUE: Multiplanar and multiecho pulse sequences of the thoracic spine were obtained without and with intravenous contrast. CONTRAST:  5 cc Gadavist IV. COMPARISON:  And PET CT scan 11/20/2018. CT chest, abdomen and pelvis 11/17/2018. FINDINGS: MRI THORACIC SPINE FINDINGS Alignment:  Normal. Vertebrae: Mild, remote superior endplate compression fracture of T12 is identified as seen on the prior examinations. Vertebral body height loss is up to approximately 15% anteriorly. Bone marrow signal is normal throughout without evidence of metastatic disease. Cord: Normal size, shape and signal throughout. The conus medullaris is normal in signal and position at the T12-L1 level. No enhancement of the cord is identified. No lesion within the central spinal canal is present. Paraspinal and other soft tissues: See report of prior cross sectional imaging studies. Right hydronephrosis noted. Disc levels: The central spinal canal and neural foramina are widely patent throughout. Minimal retropulsion off the superior endplate of B76 is noted. IMPRESSION: Negative for metastatic disease. No abnormality to correlate with potential lesion within the spinal canal at T11-12 as seen on prior CT is identified. Remote, mild superior endplate compression fracture of T12. Widely patent central canal and foramina throughout. Right hydronephrosis as seen on prior studies. Electronically Signed   By: Inge Rise M.D.   On: 11/26/2018 14:08   Ct Abdomen Pelvis W Contrast  Result Date: 11/17/2018 CLINICAL DATA:  Cervical cancer. EXAM: CT CHEST, ABDOMEN, AND PELVIS WITH CONTRAST TECHNIQUE: Multidetector CT imaging of the chest, abdomen and pelvis was performed following the standard protocol during bolus administration of intravenous contrast. CONTRAST:  33mL OMNIPAQUE IOHEXOL 300 MG/ML  SOLN  COMPARISON:  None. FINDINGS: CT CHEST FINDINGS Cardiovascular: The heart size is normal. No substantial pericardial effusion. Mediastinum/Nodes: No mediastinal lymphadenopathy. There is no hilar lymphadenopathy. The esophagus has normal imaging features. There is no axillary lymphadenopathy. Lungs/Pleura: The central tracheobronchial airways are patent. Biapical pleuroparenchymal scarring evident. No suspicious nodule or mass. No pleural effusion. Musculoskeletal: No worrisome lytic or sclerotic osseous abnormality. Superior endplate compression fracture at T12 appears nonacute. CT ABDOMEN PELVIS FINDINGS Hepatobiliary: No suspicious focal abnormality within the liver parenchyma. There is no evidence for gallstones, gallbladder wall thickening, or pericholecystic fluid. No intrahepatic or extrahepatic biliary dilation. Pancreas: No focal mass lesion. No dilatation of the main duct. No intraparenchymal cyst. No peripancreatic edema. Spleen: No splenomegaly. No focal mass lesion. Adrenals/Urinary Tract: No adrenal nodule or mass. Moderate right hydroureteronephrosis is identified with dilated right ureter down into the pelvis. Left kidney and ureter unremarkable. The urinary bladder appears normal for the degree of distention. Stomach/Bowel: Stomach is unremarkable. No gastric wall thickening. No evidence of outlet obstruction. Duodenum is normally positioned as is the ligament of Treitz. No small  bowel wall thickening. No small bowel dilatation. The terminal ileum is normal. No gross colonic mass. No colonic wall thickening. Vascular/Lymphatic: There is abdominal aortic atherosclerosis without aneurysm. No gastrohepatic or hepato duodenal ligament lymphadenopathy. Abnormal soft tissue is seen around the distal aorta involving the IMA origin (see 73/2). This amorphous abnormal soft tissue extends down along the iliac arteries bilaterally. Reproductive: The endometrial cavity of the uterus is fluid-filled and markedly  distended, consistent with cervical obstruction. The cervix is heterogeneous and appears enlarged. Extending posterolaterally to the right from the cervical tissue is a large 6.6 x 3.0 cm necrotic irregular soft tissue mass extending into the right presacral space. The upper vaginal wall thickening evident. Other: Trace free fluid noted in the pelvis. There is presacral edema evident. Musculoskeletal: No worrisome lytic or sclerotic osseous abnormality. IMPRESSION: 1. Irregular enlargement of the cervix with extension of soft tissue from the cervix into the posterior presacral space along the side of the rectum. Wall thickening in the upper vagina is compatible with tumor involvement. The endometrial cavity of the uterus is markedly expanded and fluid-filled consistent with obstruction. No definite involvement of the posterior bladder wall evident by CT. 2. Moderate right hydroureteronephrosis with ureteral dilatation extending down to the right pelvic sidewall adjacent to the cervix. 3. Abnormal, amorphous soft tissue surrounds the distal aorta and common iliac arteries. Imaging appearance concerning for tumor spread. 4. No evidence for metastatic disease in the chest. 5.  Aortic Atherosclerois (ICD10-170.0) Electronically Signed   By: Misty Stanley M.D.   On: 11/17/2018 16:34   Nm Pet Image Initial (pi) Skull Base To Thigh  Result Date: 11/20/2018 CLINICAL DATA:  Initial treatment strategy for cervical cancer. EXAM: NUCLEAR MEDICINE PET SKULL BASE TO THIGH TECHNIQUE: 6.2 mCi F-18 FDG was injected intravenously. Full-ring PET imaging was performed from the skull base to thigh after the radiotracer. CT data was obtained and used for attenuation correction and anatomic localization. Fasting blood glucose: 94 mg/dl COMPARISON:  CT abdomen/pelvis 11/17/2018 FINDINGS: Mediastinal blood pool activity: SUV max 2.1 NECK: No hypermetabolic lymph nodes in the neck. Areas of FDG uptake identified in the maxilla and  mandible, potentially related to periodontal disease. Incidental CT findings: none CHEST: 7 mm short axis AP window lymph node (59/4) shows low level uptake with SUV max = 4.2. Foci of hypermetabolic FDG accumulation are identified in both hilar regions without underlying lymph nodes evident on noncontrast CT imaging. SUV max = 3.6 right hilum. Incidental CT findings: Biapical pleuroparenchymal scarring evident. No suspicious pulmonary nodule or mass. No pleural effusion. ABDOMEN/PELVIS: No abnormal hypermetabolic activity within the liver, pancreas, adrenal glands, or spleen. The abnormal para-aortic soft tissue seen just proximal to the bifurcation on the previous exam is hypermetabolic with SUV max = 4.5. 8 mm short axis left pelvic sidewall lymph node (952/8) is hypermetabolic with SUV max = 6.2. 7 x 11 mm nodule posterior right pelvis (413/2) is hypermetabolic with SUV max = 4.3. XR cervical direct extension of tumor posterolaterally on the right is seen previously is hypermetabolic with SUV max = 44.0. Incidental CT findings: Similar appearance milder it right hydroureteronephrosis with ureteral obstruction in the region of the cervical lesion. Asymmetric bladder wall thickening posteriorly on the right (167/4) is more prominent on the current study than previously. Radiotracer in the bladder urine obscures assessment of the bladder wall. SKELETON: No focal hypermetabolic activity to suggest skeletal metastasis. Focal hypermetabolism identified in the spinal canal at the level of T11-12 without underlying lesion  evident on noncontrast CT. SUV max = 3.2. Incidental CT findings: none IMPRESSION: 1. Cervical lesion with direct extra cervical extension posterolaterally on the right is markedly hypermetabolic consistent with known neoplasm. 2. 7 x 11 mm posterior right pelvic irregular nodule is hypermetabolic consistent with metastatic disease. 3. Para-aortic soft tissue in the region of the bifurcation and 8 mm  short axis left pelvic sidewall lymph node show marked hypermetabolism, consistent with metastatic involvement. 4. Low level FDG uptake identified in a unenlarged AP window lymph node of the mediastinum with focal FDG uptake identified in each hilum. While no underlying hilar lymph nodes evident on this noncontrast CT exam, tiny lymph nodes were visible in the hilar regions on diagnostic CT of 11/17/2018. While this may be reactive given the low level uptake, metastatic disease cannot be definitively excluded. 5. Focus of hypermetabolic activity in the spinal canal at the T11-12 level. No underlying mass lesion evident on noncontrast CT imaging. Thoracic spine MRI without and with contrast may be warranted to further evaluate. 6. Stable moderate right hydroureteronephrosis. Electronically Signed   By: Misty Stanley M.D.   On: 11/20/2018 17:24   Ir Imaging Guided Port Insertion  Result Date: 11/25/2018 CLINICAL DATA:  Cervical carcinoma and need for porta cath to begin chemotherapy. EXAM: IMPLANTED PORT A CATH PLACEMENT WITH ULTRASOUND AND FLUOROSCOPIC GUIDANCE ANESTHESIA/SEDATION: 2.0 mg IV Versed; 100 mcg IV Fentanyl Total Moderate Sedation Time:  30 minutes The patient's level of consciousness and physiologic status were continuously monitored during the procedure by Radiology nursing. Additional Medications: 2 g IV Ancef. FLUOROSCOPY TIME:  12 seconds.  0.4 mGy. PROCEDURE: The procedure, risks, benefits, and alternatives were explained to the patient. Questions regarding the procedure were encouraged and answered. The patient understands and consents to the procedure. A time-out was performed prior to initiating the procedure. Ultrasound was utilized to confirm patency of the right internal jugular vein. The right neck and chest were prepped with chlorhexidine in a sterile fashion, and a sterile drape was applied covering the operative field. Maximum barrier sterile technique with sterile gowns and gloves  were used for the procedure. Local anesthesia was provided with 1% lidocaine. After creating a small venotomy incision, a 21 gauge needle was advanced into the right internal jugular vein under direct, real-time ultrasound guidance. Ultrasound image documentation was performed. After securing guidewire access, an 8 Fr dilator was placed. A J-wire was kinked to measure appropriate catheter length. A subcutaneous port pocket was then created along the upper chest wall utilizing sharp and blunt dissection. Portable cautery was utilized. The pocket was irrigated with sterile saline. A single lumen power injectable port was chosen for placement. The 8 Fr catheter was tunneled from the port pocket site to the venotomy incision. The port was placed in the pocket. External catheter was trimmed to appropriate length based on guidewire measurement. At the venotomy, an 8 Fr peel-away sheath was placed over a guidewire. The catheter was then placed through the sheath and the sheath removed. Final catheter positioning was confirmed and documented with a fluoroscopic spot image. The port was accessed with a needle and aspirated and flushed with heparinized saline. The access needle was removed. The venotomy and port pocket incisions were closed with subcutaneous 3-0 Monocryl and subcuticular 4-0 Vicryl. Dermabond was applied to both incisions. COMPLICATIONS: COMPLICATIONS None FINDINGS: After catheter placement, the tip lies at the cavo-atrial junction. The catheter aspirates normally and is ready for immediate use. IMPRESSION: Placement of single lumen port a cath  via right internal jugular vein. The catheter tip lies at the cavo-atrial junction. A power injectable port a cath was placed and is ready for immediate use. Electronically Signed   By: Aletta Edouard M.D.   On: 11/25/2018 10:49    All questions were answered. The patient knows to call the clinic with any problems, questions or concerns. No barriers to learning was  detected.  I spent 15 minutes counseling the patient face to face. The total time spent in the appointment was 20 minutes and more than 50% was on counseling and review of test results  Heath Lark, MD 12/17/2018 2:42 PM

## 2018-12-17 NOTE — Assessment & Plan Note (Signed)
She is attempting to quit smoking I congratulated her efforts

## 2018-12-18 ENCOUNTER — Ambulatory Visit
Admission: RE | Admit: 2018-12-18 | Discharge: 2018-12-18 | Disposition: A | Discharge status: Home / Self Care | Payer: BLUE CROSS/BLUE SHIELD | Source: Ambulatory Visit | Attending: Radiation Oncology | Admitting: Radiation Oncology

## 2018-12-18 ENCOUNTER — Other Ambulatory Visit: Payer: Self-pay

## 2018-12-18 ENCOUNTER — Other Ambulatory Visit: Payer: Self-pay | Admitting: Radiation Oncology

## 2018-12-18 ENCOUNTER — Ambulatory Visit: Payer: BLUE CROSS/BLUE SHIELD

## 2018-12-18 DIAGNOSIS — Z5111 Encounter for antineoplastic chemotherapy: Secondary | ICD-10-CM | POA: Diagnosis not present

## 2018-12-18 MED ORDER — MORPHINE SULFATE 15 MG PO TABS: 15.0000 mg | ORAL_TABLET | ORAL | 0 refills | Status: DC | PRN

## 2018-12-21 ENCOUNTER — Other Ambulatory Visit: Payer: Self-pay

## 2018-12-21 ENCOUNTER — Ambulatory Visit
Admission: RE | Admit: 2018-12-21 | Discharge: 2018-12-21 | Disposition: A | Discharge status: Home / Self Care | Payer: BLUE CROSS/BLUE SHIELD | Source: Ambulatory Visit | Attending: Radiation Oncology | Admitting: Radiation Oncology

## 2018-12-21 ENCOUNTER — Telehealth: Payer: Self-pay | Admitting: Oncology

## 2018-12-21 DIAGNOSIS — Z5111 Encounter for antineoplastic chemotherapy: Secondary | ICD-10-CM | POA: Diagnosis not present

## 2018-12-21 NOTE — Telephone Encounter (Signed)
Called [facility] to see why the refill of MSIR would not go throught.  They said that controled prescriptions of morphine over 90 meq needed a ICD 10 diagnosis code.  Gave them C53.9 code and they said prescription will go through.  Called [place] and left her a message that prescription should be available at [facility] and to call if she is not able to pick it up.

## 2018-12-22 ENCOUNTER — Other Ambulatory Visit: Payer: Self-pay

## 2018-12-22 ENCOUNTER — Ambulatory Visit
Admission: RE | Admit: 2018-12-22 | Discharge: 2018-12-22 | Disposition: A | Discharge status: Home / Self Care | Payer: BLUE CROSS/BLUE SHIELD | Source: Ambulatory Visit | Attending: Radiation Oncology | Admitting: Radiation Oncology

## 2018-12-22 DIAGNOSIS — Z5111 Encounter for antineoplastic chemotherapy: Secondary | ICD-10-CM | POA: Diagnosis not present

## 2018-12-23 ENCOUNTER — Other Ambulatory Visit: Payer: Self-pay | Admitting: Hematology and Oncology

## 2018-12-23 ENCOUNTER — Other Ambulatory Visit: Payer: Self-pay

## 2018-12-23 ENCOUNTER — Inpatient Hospital Stay: Payer: BLUE CROSS/BLUE SHIELD

## 2018-12-23 ENCOUNTER — Ambulatory Visit
Admission: RE | Admit: 2018-12-23 | Discharge: 2018-12-23 | Disposition: A | Discharge status: Home / Self Care | Payer: BLUE CROSS/BLUE SHIELD | Source: Ambulatory Visit | Attending: Radiation Oncology | Admitting: Radiation Oncology

## 2018-12-23 DIAGNOSIS — C531 Malignant neoplasm of exocervix: Secondary | ICD-10-CM

## 2018-12-23 DIAGNOSIS — Z5111 Encounter for antineoplastic chemotherapy: Secondary | ICD-10-CM | POA: Diagnosis not present

## 2018-12-23 LAB — CMP (CANCER CENTER ONLY)
ALT: 22 U/L (ref 0–44)
AST: 19 U/L (ref 15–41)
Albumin: 3.5 g/dL (ref 3.5–5.0)
Alkaline Phosphatase: 77 U/L (ref 38–126)
Anion gap: 9 (ref 5–15)
BUN: 14 mg/dL (ref 6–20)
CO2: 23 mmol/L (ref 22–32)
Calcium: 9.1 mg/dL (ref 8.9–10.3)
Chloride: 101 mmol/L (ref 98–111)
Creatinine: 0.76 mg/dL (ref 0.44–1.00)
GFR, Est AFR Am: >60 mL/min (ref >60)
GFR, Estimated: >60 mL/min (ref >60)
Glucose, Bld: 110 mg/dL — ABNORMAL HIGH (ref 70–99)
Potassium: 3.6 mmol/L (ref 3.5–5.1)
Sodium: 133 mmol/L — ABNORMAL LOW (ref 135–145)
Total Bilirubin: 0.2 mg/dL — ABNORMAL LOW (ref 0.3–1.2)
Total Protein: 6.6 g/dL (ref 6.5–8.1)

## 2018-12-23 LAB — CBC WITH DIFFERENTIAL/PLATELET
Abs Immature Granulocytes: 0.02 10*3/uL (ref 0.00–0.07)
Basophils Absolute: 0 10*3/uL (ref 0.0–0.1)
Basophils Relative: 0 %
Eosinophils Absolute: 0 10*3/uL (ref 0.0–0.5)
Eosinophils Relative: 1 %
HCT: 27.3 % — ABNORMAL LOW (ref 36.0–46.0)
Hemoglobin: 9.4 g/dL — ABNORMAL LOW (ref 12.0–15.0)
Immature Granulocytes: 1 %
Lymphocytes Relative: 14 %
Lymphs Abs: 0.2 10*3/uL — ABNORMAL LOW (ref 0.7–4.0)
MCH: 28.8 pg (ref 26.0–34.0)
MCHC: 34.4 g/dL (ref 30.0–36.0)
MCV: 83.7 fL (ref 80.0–100.0)
Monocytes Absolute: 0.2 10*3/uL (ref 0.1–1.0)
Monocytes Relative: 15 %
Neutro Abs: 1.1 10*3/uL — ABNORMAL LOW (ref 1.7–7.7)
Neutrophils Relative %: 69 %
Platelets: 89 10*3/uL — ABNORMAL LOW (ref 150–400)
RBC: 3.26 MIL/uL — ABNORMAL LOW (ref 3.87–5.11)
RDW: 12.6 % (ref 11.5–15.5)
WBC: 1.5 10*3/uL — ABNORMAL LOW (ref 4.0–10.5)
nRBC: 0 % (ref 0.0–0.2)

## 2018-12-23 LAB — MAGNESIUM: Magnesium: 1.8 mg/dL (ref 1.7–2.4)

## 2018-12-24 ENCOUNTER — Inpatient Hospital Stay (HOSPITAL_BASED_OUTPATIENT_CLINIC_OR_DEPARTMENT_OTHER): Payer: BLUE CROSS/BLUE SHIELD | Admitting: Hematology and Oncology

## 2018-12-24 ENCOUNTER — Other Ambulatory Visit: Payer: Self-pay

## 2018-12-24 ENCOUNTER — Inpatient Hospital Stay: Payer: BLUE CROSS/BLUE SHIELD

## 2018-12-24 ENCOUNTER — Ambulatory Visit
Admission: RE | Admit: 2018-12-24 | Discharge: 2018-12-24 | Disposition: A | Discharge status: Home / Self Care | Payer: BLUE CROSS/BLUE SHIELD | Source: Ambulatory Visit | Attending: Radiation Oncology | Admitting: Radiation Oncology

## 2018-12-24 ENCOUNTER — Other Ambulatory Visit: Payer: Self-pay | Admitting: Hematology and Oncology

## 2018-12-24 DIAGNOSIS — C531 Malignant neoplasm of exocervix: Secondary | ICD-10-CM | POA: Diagnosis not present

## 2018-12-24 DIAGNOSIS — D61810 Antineoplastic chemotherapy induced pancytopenia: Secondary | ICD-10-CM | POA: Diagnosis not present

## 2018-12-24 DIAGNOSIS — D61818 Other pancytopenia: Secondary | ICD-10-CM

## 2018-12-24 DIAGNOSIS — Z79899 Other long term (current) drug therapy: Secondary | ICD-10-CM

## 2018-12-24 DIAGNOSIS — Z720 Tobacco use: Secondary | ICD-10-CM | POA: Diagnosis not present

## 2018-12-24 DIAGNOSIS — Z5111 Encounter for antineoplastic chemotherapy: Secondary | ICD-10-CM | POA: Diagnosis not present

## 2018-12-24 MED ORDER — FILGRASTIM-SNDZ 300 MCG/0.5ML IJ SOSY
300.0000 ug | PREFILLED_SYRINGE | Freq: Once | INTRAMUSCULAR | Status: AC
  Administered 2018-12-24: 300 ug via SUBCUTANEOUS
  Filled 2018-12-24: qty 0.5

## 2018-12-24 NOTE — Patient Instructions (Signed)
Filgrastim, G-CSF injection What is this medicine? FILGRASTIM, G-CSF (fil GRA stim) is a granulocyte colony-stimulating factor that stimulates the growth of neutrophils, a type of white blood cell (WBC) important in the body's fight against infection. It is used to reduce the incidence of fever and infection in patients with certain types of cancer who are receiving chemotherapy that affects the bone marrow, to stimulate blood cell production for removal of WBCs from the body prior to a bone marrow transplantation, to reduce the incidence of fever and infection in patients who have severe chronic neutropenia, and to improve survival outcomes following high-dose radiation exposure that is toxic to the bone marrow. This medicine may be used for other purposes; ask your health care provider or pharmacist if you have questions. COMMON BRAND NAME(S): Neupogen, Nivestym, Zarxio What should I tell my health care provider before I take this medicine? They need to know if you have any of these conditions: -kidney disease -latex allergy -ongoing radiation therapy -sickle cell disease -an unusual or allergic reaction to filgrastim, pegfilgrastim, other medicines, foods, dyes, or preservatives -pregnant or trying to get pregnant -breast-feeding How should I use this medicine? This medicine is for injection under the skin or infusion into a vein. As an infusion into a vein, it is usually given by a health care professional in a hospital or clinic setting. If you get this medicine at home, you will be taught how to prepare and give this medicine. Refer to the Instructions for Use that come with your medication packaging. Use exactly as directed. Take your medicine at regular intervals. Do not take your medicine more often than directed. It is important that you put your used needles and syringes in a special sharps container. Do not put them in a trash can. If you do not have a sharps container, call your  pharmacist or healthcare provider to get one. Talk to your pediatrician regarding the use of this medicine in children. While this drug may be prescribed for children as young as 7 months for selected conditions, precautions do apply. Overdosage: If you think you have taken too much of this medicine contact a poison control center or emergency room at once. NOTE: This medicine is only for you. Do not share this medicine with others. What if I miss a dose? It is important not to miss your dose. Call your doctor or health care professional if you miss a dose. What may interact with this medicine? This medicine may interact with the following medications: -medicines that may cause a release of neutrophils, such as lithium This list may not describe all possible interactions. Give your health care provider a list of all the medicines, herbs, non-prescription drugs, or dietary supplements you use. Also tell them if you smoke, drink alcohol, or use illegal drugs. Some items may interact with your medicine. What should I watch for while using this medicine? You may need blood work done while you are taking this medicine. What side effects may I notice from receiving this medicine? Side effects that you should report to your doctor or health care professional as soon as possible: -allergic reactions like skin rash, itching or hives, swelling of the face, lips, or tongue -back pain -dizziness or feeling faint -fever -pain, redness, or irritation at site where injected -pinpoint red spots on the skin -shortness of breath or breathing problems -signs and symptoms of kidney injury like trouble passing urine, change in the amount of urine, or red or dark-brown urine -stomach   or side pain, or pain at the shoulder -swelling -tiredness -unusual bleeding or bruising Side effects that usually do not require medical attention (report to your doctor or health care professional if they continue or are  bothersome): -bone pain -cough -diarrhea -hair loss -headache -muscle pain This list may not describe all possible side effects. Call your doctor for medical advice about side effects. You may report side effects to FDA at 1-800-FDA-1088. Where should I keep my medicine? Keep out of the reach of children. Store in a refrigerator between 2 and 8 degrees C (36 and 46 degrees F). Do not freeze. Keep in carton to protect from light. Throw away this medicine if vials or syringes are left out of the refrigerator for more than 24 hours. Throw away any unused medicine after the expiration date. NOTE: This sheet is a summary. It may not cover all possible information. If you have questions about this medicine, talk to your doctor, pharmacist, or health care provider.  2019 Elsevier/Gold Standard (2018-03-06 15:39:45)  

## 2018-12-25 ENCOUNTER — Inpatient Hospital Stay: Payer: BLUE CROSS/BLUE SHIELD

## 2018-12-25 ENCOUNTER — Ambulatory Visit
Admission: RE | Admit: 2018-12-25 | Discharge: 2018-12-25 | Disposition: A | Discharge status: Home / Self Care | Payer: BLUE CROSS/BLUE SHIELD | Source: Ambulatory Visit | Attending: Radiation Oncology | Admitting: Radiation Oncology

## 2018-12-25 ENCOUNTER — Ambulatory Visit: Payer: BLUE CROSS/BLUE SHIELD

## 2018-12-25 ENCOUNTER — Other Ambulatory Visit: Payer: Self-pay

## 2018-12-25 ENCOUNTER — Encounter: Payer: Self-pay | Admitting: Hematology and Oncology

## 2018-12-25 VITALS — BP 121/64 | HR 79 | Temp 98.9°F | Resp 16

## 2018-12-25 DIAGNOSIS — C531 Malignant neoplasm of exocervix: Secondary | ICD-10-CM

## 2018-12-25 DIAGNOSIS — Z5111 Encounter for antineoplastic chemotherapy: Secondary | ICD-10-CM | POA: Diagnosis not present

## 2018-12-25 MED ORDER — FILGRASTIM-SNDZ 300 MCG/0.5ML IJ SOSY
300.0000 ug | PREFILLED_SYRINGE | Freq: Once | INTRAMUSCULAR | Status: AC
  Administered 2018-12-25: 300 ug via SUBCUTANEOUS
  Filled 2018-12-25: qty 0.5

## 2018-12-25 NOTE — Assessment & Plan Note (Signed)
Unfortunately, she has persistent pancytopenia I will prescribe G-CSF support for her neutropenia We will delay her chemotherapy by 1 week

## 2018-12-25 NOTE — Assessment & Plan Note (Signed)
She is attempting to quit smoking I congratulated her efforts

## 2018-12-25 NOTE — Progress Notes (Signed)
[place] OFFICE PROGRESS NOTE  Patient Care Team: Marguerita Merles, MD as PCP - General (Family Medicine) Gae Dry, MD as Referring Physician (Obstetrics and Gynecology) Clent Jacks, RN as Registered Nurse Awanda Mink, Craige Cotta, RN as Oncology Nurse Navigator (Oncology)  ASSESSMENT & PLAN:  Cervical ca [facility]) Unfortunately, she has persistent pancytopenia I will prescribe G-CSF support for her neutropenia We will delay her chemotherapy by 1 week  Pancytopenia, acquired [facility]) This is due to recent treatment She is not symptomatic We will hold treatment this week I plan to adjust the dose of her chemotherapy in the future We will check her blood again next week She will get G-CSF support to avoid severe infection during treatment  Tobacco abuse She is attempting to quit smoking I congratulated her efforts   No orders of the defined types were placed in this encounter.   INTERVAL HISTORY: Please see below for problem oriented charting. She returns for further follow-up Denies recent infection, fever or chills No recent nausea She is attempting to quit smoking  SUMMARY OF ONCOLOGIC HISTORY:   Cervical ca ([place])   11/03/2018 Initial Diagnosis    She presented with postmenopausal bleeding since 2019 (at least 1 year) and weight loss. Had prior abnormal PAP smear, last done in 2010    11/04/2018 Pathology Results    Endometrium, biopsy - INVASIVE MODERATELY DIFFERENTIATED SQUAMOUS CELL CARCINOMA. SEE NOTE Diagnosis Note Squamous cell carcinoma arises in a background of high-grade dysplasia with possible glandular involvement. Tumor cells are positive for CK5/6, consistent with the above diagnosis. Immunohistochemical stain for p16 is diffusely positive in the tumor cells.    11/17/2018 Cancer Staging    Staging form: Cervix Uteri, AJCC 8th Edition - Clinical: FIGO Stage IIIB (cT3b, cN0, cM0) - Signed by Heath Lark, MD on 11/17/2018    11/20/2018 PET scan     IMPRESSION: 1. Cervical lesion with direct extra cervical extension posterolaterally on the right is markedly hypermetabolic consistent with known neoplasm. 2. 7 x 11 mm posterior right pelvic irregular nodule is hypermetabolic consistent with metastatic disease. 3. Para-aortic soft tissue in the region of the bifurcation and 8 mm short axis left pelvic sidewall lymph node show marked hypermetabolism, consistent with metastatic involvement. 4. Low level FDG uptake identified in a unenlarged AP window lymph node of the mediastinum with focal FDG uptake identified in each hilum. While no underlying hilar lymph nodes evident on this noncontrast CT exam, tiny lymph nodes were visible in the hilar regions on diagnostic CT of 11/17/2018. While this may be reactive given the low level uptake, metastatic disease cannot be definitively excluded. 5. Focus of hypermetabolic activity in the spinal canal at the T11-12 level. No underlying mass lesion evident on noncontrast CT imaging. Thoracic spine MRI without and with contrast may be warranted to further evaluate. 6. Stable moderate right hydroureteronephrosis.    11/25/2018 Procedure    Placement of single lumen port a cath via right internal jugular vein. The catheter tip lies at the cavo-atrial junction. A power injectable port a cath was placed and is ready for immediate use.     11/26/2018 Imaging    MRI spine Negative for metastatic disease. No abnormality to correlate with potential lesion within the spinal canal at T11-12 as seen on prior CT is identified.  Remote, mild superior endplate compression fracture of T12.  Widely patent central canal and foramina throughout.  Right hydronephrosis as seen on prior studies.    11/27/2018 -  Chemotherapy    The patient had weekly cisplatin with radiation     REVIEW OF SYSTEMS:   Constitutional: Denies fevers, chills or abnormal weight loss Eyes: Denies blurriness of vision Ears, nose, mouth,  throat, and face: Denies mucositis or sore throat Respiratory: Denies cough, dyspnea or wheezes Cardiovascular: Denies palpitation, chest discomfort or lower extremity swelling Gastrointestinal:  Denies nausea, heartburn or change in bowel habits Skin: Denies abnormal skin rashes Lymphatics: Denies new lymphadenopathy or easy bruising Neurological:Denies numbness, tingling or new weaknesses Behavioral/Psych: Mood is stable, no new changes  All other systems were reviewed with the patient and are negative.  I have reviewed the past medical history, past surgical history, social history and family history with the patient and they are unchanged from previous note.  ALLERGIES:  has No Known Allergies.  MEDICATIONS:  Current Outpatient Medications  Medication Sig Dispense Refill  . acetaminophen (TYLENOL) 500 MG tablet Take 1,000 mg by mouth every 6 (six) hours as needed (pain).    Marland Kitchen diphenoxylate-atropine (LOMOTIL) 2.5-0.025 MG tablet Take 1 tablet by mouth 4 (four) times daily as needed for diarrhea or loose stools. 30 tablet 0  . lidocaine-prilocaine (EMLA) cream Apply to affected area once (Patient taking differently: Apply 1 application topically daily as needed (local anesthetic). Apply to affected area once) 30 g 3  . morphine (MSIR) 15 MG tablet Take 1 tablet (15 mg total) by mouth every 4 (four) hours as needed for severe pain. 30 tablet 0  . ondansetron (ZOFRAN) 8 MG tablet Take 1 tablet (8 mg total) by mouth every 8 (eight) hours as needed. Start on the third day after chemotherapy. 90 tablet 1  . prochlorperazine (COMPAZINE) 10 MG tablet Take 1 tablet (10 mg total) by mouth every 6 (six) hours as needed (Nausea or vomiting). 90 tablet 1  . tiZANidine (ZANAFLEX) 2 MG tablet Take 2 mg by mouth at bedtime as needed.      No current facility-administered medications for this visit.     PHYSICAL EXAMINATION: ECOG PERFORMANCE STATUS: 1 - Symptomatic but completely ambulatory  Vitals:    12/24/18 1226  BP: 115/75  Pulse: 76  Resp: 18  Temp: 98.2 F (36.8 C)  SpO2: 100%   Filed Weights   12/24/18 1226  Weight: 115 lb (52.2 kg)    GENERAL:alert, no distress and comfortable SKIN: skin color, texture, turgor are normal, no rashes or significant lesions EYES: normal, Conjunctiva are pink and non-injected, sclera clear OROPHARYNX:no exudate, no erythema and lips, buccal mucosa, and tongue normal  NECK: supple, thyroid normal size, non-tender, without nodularity LYMPH:  no palpable lymphadenopathy in the cervical, axillary or inguinal LUNGS: clear to auscultation and percussion with normal breathing effort HEART: regular rate & rhythm and no murmurs and no lower extremity edema ABDOMEN:abdomen soft, non-tender and normal bowel sounds Musculoskeletal:no cyanosis of digits and no clubbing  NEURO: alert & oriented x 3 with fluent speech, no focal motor/sensory deficits  LABORATORY DATA:  I have reviewed the data as listed    Component Value Date/Time   NA 133 (L) 12/23/2018 1255   K 3.6 12/23/2018 1255   CL 101 12/23/2018 1255   CO2 23 12/23/2018 1255   GLUCOSE 110 (H) 12/23/2018 1255   BUN 14 12/23/2018 1255   CREATININE 0.76 12/23/2018 1255   CALCIUM 9.1 12/23/2018 1255   PROT 6.6 12/23/2018 1255   ALBUMIN 3.5 12/23/2018 1255   AST 19 12/23/2018 1255   ALT 22 12/23/2018 1255   ALKPHOS  77 12/23/2018 1255   BILITOT 0.2 (L) 12/23/2018 1255   GFRNONAA >60 12/23/2018 1255   GFRAA >60 12/23/2018 1255    No results found for: SPEP, UPEP  Lab Results  Component Value Date   WBC 1.5 (L) 12/23/2018   NEUTROABS 1.1 (L) 12/23/2018   HGB 9.4 (L) 12/23/2018   HCT 27.3 (L) 12/23/2018   MCV 83.7 12/23/2018   PLT 89 (L) 12/23/2018      Chemistry      Component Value Date/Time   NA 133 (L) 12/23/2018 1255   K 3.6 12/23/2018 1255   CL 101 12/23/2018 1255   CO2 23 12/23/2018 1255   BUN 14 12/23/2018 1255   CREATININE 0.76 12/23/2018 1255      Component  Value Date/Time   CALCIUM 9.1 12/23/2018 1255   ALKPHOS 77 12/23/2018 1255   AST 19 12/23/2018 1255   ALT 22 12/23/2018 1255   BILITOT 0.2 (L) 12/23/2018 1255       RADIOGRAPHIC STUDIES: I have personally reviewed the radiological images as listed and agreed with the findings in the report. Mr Thoracic Spine W Wo Contrast  Result Date: 11/26/2018 CLINICAL DATA:  Patient with a history of metastatic cervical carcinoma. Focal uptake seen in the central spinal canal at the 11 of T11-12. Question metastatic disease. EXAM: MRI THORACIC WITHOUT AND WITH CONTRAST TECHNIQUE: Multiplanar and multiecho pulse sequences of the thoracic spine were obtained without and with intravenous contrast. CONTRAST:  5 cc Gadavist IV. COMPARISON:  And PET CT scan 11/20/2018. CT chest, abdomen and pelvis 11/17/2018. FINDINGS: MRI THORACIC SPINE FINDINGS Alignment:  Normal. Vertebrae: Mild, remote superior endplate compression fracture of T12 is identified as seen on the prior examinations. Vertebral body height loss is up to approximately 15% anteriorly. Bone marrow signal is normal throughout without evidence of metastatic disease. Cord: Normal size, shape and signal throughout. The conus medullaris is normal in signal and position at the T12-L1 level. No enhancement of the cord is identified. No lesion within the central spinal canal is present. Paraspinal and other soft tissues: See report of prior cross sectional imaging studies. Right hydronephrosis noted. Disc levels: The central spinal canal and neural foramina are widely patent throughout. Minimal retropulsion off the superior endplate of I95 is noted. IMPRESSION: Negative for metastatic disease. No abnormality to correlate with potential lesion within the spinal canal at T11-12 as seen on prior CT is identified. Remote, mild superior endplate compression fracture of T12. Widely patent central canal and foramina throughout. Right hydronephrosis as seen on prior studies.  Electronically Signed   By: Inge Rise M.D.   On: 11/26/2018 14:08    All questions were answered. The patient knows to call the clinic with any problems, questions or concerns. No barriers to learning was detected.  I spent 15 minutes counseling the patient face to face. The total time spent in the appointment was 20 minutes and more than 50% was on counseling and review of test results  Heath Lark, MD 12/25/2018 11:09 AM

## 2018-12-25 NOTE — Assessment & Plan Note (Signed)
This is due to recent treatment She is not symptomatic We will hold treatment this week I plan to adjust the dose of her chemotherapy in the future We will check her blood again next week She will get G-CSF support to avoid severe infection during treatment

## 2018-12-25 NOTE — Patient Instructions (Signed)
Filgrastim, G-CSF injection What is this medicine? FILGRASTIM, G-CSF (fil GRA stim) is a granulocyte colony-stimulating factor that stimulates the growth of neutrophils, a type of white blood cell (WBC) important in the body's fight against infection. It is used to reduce the incidence of fever and infection in patients with certain types of cancer who are receiving chemotherapy that affects the bone marrow, to stimulate blood cell production for removal of WBCs from the body prior to a bone marrow transplantation, to reduce the incidence of fever and infection in patients who have severe chronic neutropenia, and to improve survival outcomes following high-dose radiation exposure that is toxic to the bone marrow. This medicine may be used for other purposes; ask your health care provider or pharmacist if you have questions. COMMON BRAND NAME(S): Neupogen, Nivestym, Zarxio What should I tell my health care provider before I take this medicine? They need to know if you have any of these conditions: -kidney disease -latex allergy -ongoing radiation therapy -sickle cell disease -an unusual or allergic reaction to filgrastim, pegfilgrastim, other medicines, foods, dyes, or preservatives -pregnant or trying to get pregnant -breast-feeding How should I use this medicine? This medicine is for injection under the skin or infusion into a vein. As an infusion into a vein, it is usually given by a health care professional in a hospital or clinic setting. If you get this medicine at home, you will be taught how to prepare and give this medicine. Refer to the Instructions for Use that come with your medication packaging. Use exactly as directed. Take your medicine at regular intervals. Do not take your medicine more often than directed. It is important that you put your used needles and syringes in a special sharps container. Do not put them in a trash can. If you do not have a sharps container, call your  pharmacist or healthcare provider to get one. Talk to your pediatrician regarding the use of this medicine in children. While this drug may be prescribed for children as young as 7 months for selected conditions, precautions do apply. Overdosage: If you think you have taken too much of this medicine contact a poison control center or emergency room at once. NOTE: This medicine is only for you. Do not share this medicine with others. What if I miss a dose? It is important not to miss your dose. Call your doctor or health care professional if you miss a dose. What may interact with this medicine? This medicine may interact with the following medications: -medicines that may cause a release of neutrophils, such as lithium This list may not describe all possible interactions. Give your health care provider a list of all the medicines, herbs, non-prescription drugs, or dietary supplements you use. Also tell them if you smoke, drink alcohol, or use illegal drugs. Some items may interact with your medicine. What should I watch for while using this medicine? You may need blood work done while you are taking this medicine. What side effects may I notice from receiving this medicine? Side effects that you should report to your doctor or health care professional as soon as possible: -allergic reactions like skin rash, itching or hives, swelling of the face, lips, or tongue -back pain -dizziness or feeling faint -fever -pain, redness, or irritation at site where injected -pinpoint red spots on the skin -shortness of breath or breathing problems -signs and symptoms of kidney injury like trouble passing urine, change in the amount of urine, or red or dark-brown urine -stomach   or side pain, or pain at the shoulder -swelling -tiredness -unusual bleeding or bruising Side effects that usually do not require medical attention (report to your doctor or health care professional if they continue or are  bothersome): -bone pain -cough -diarrhea -hair loss -headache -muscle pain This list may not describe all possible side effects. Call your doctor for medical advice about side effects. You may report side effects to FDA at 1-800-FDA-1088. Where should I keep my medicine? Keep out of the reach of children. Store in a refrigerator between 2 and 8 degrees C (36 and 46 degrees F). Do not freeze. Keep in carton to protect from light. Throw away this medicine if vials or syringes are left out of the refrigerator for more than 24 hours. Throw away any unused medicine after the expiration date. NOTE: This sheet is a summary. It may not cover all possible information. If you have questions about this medicine, talk to your doctor, pharmacist, or health care provider.  2019 Elsevier/Gold Standard (2018-03-06 15:39:45)  

## 2018-12-29 ENCOUNTER — Other Ambulatory Visit: Payer: Self-pay

## 2018-12-29 ENCOUNTER — Telehealth: Payer: Self-pay

## 2018-12-29 ENCOUNTER — Ambulatory Visit
Admission: RE | Admit: 2018-12-29 | Discharge: 2018-12-29 | Disposition: A | Discharge status: Home / Self Care | Payer: BLUE CROSS/BLUE SHIELD | Source: Ambulatory Visit | Attending: Radiation Oncology | Admitting: Radiation Oncology

## 2018-12-29 DIAGNOSIS — Z5111 Encounter for antineoplastic chemotherapy: Secondary | ICD-10-CM | POA: Diagnosis not present

## 2018-12-29 NOTE — Telephone Encounter (Signed)
Nutrition  Patient identified on Malnutrition Screening tool for weight loss and poor appetite.  Called patient to introduce self and service at [facility].  No answer.  Left message with contact information  Owens Hara B. Zenia Resides, [place], [place] Registered Dietitian [phone] (pager)

## 2018-12-30 ENCOUNTER — Ambulatory Visit
Admission: RE | Admit: 2018-12-30 | Discharge: 2018-12-30 | Disposition: A | Discharge status: Home / Self Care | Payer: BLUE CROSS/BLUE SHIELD | Source: Ambulatory Visit | Attending: Radiation Oncology | Admitting: Radiation Oncology

## 2018-12-30 ENCOUNTER — Inpatient Hospital Stay: Payer: BLUE CROSS/BLUE SHIELD

## 2018-12-30 ENCOUNTER — Other Ambulatory Visit: Payer: Self-pay

## 2018-12-30 ENCOUNTER — Other Ambulatory Visit: Payer: Self-pay | Admitting: Radiation Oncology

## 2018-12-30 ENCOUNTER — Other Ambulatory Visit (HOSPITAL_COMMUNITY): Payer: Self-pay | Admitting: Radiation Oncology

## 2018-12-30 DIAGNOSIS — C531 Malignant neoplasm of exocervix: Secondary | ICD-10-CM

## 2018-12-30 DIAGNOSIS — Z5111 Encounter for antineoplastic chemotherapy: Secondary | ICD-10-CM | POA: Diagnosis not present

## 2018-12-30 DIAGNOSIS — Z510 Encounter for antineoplastic radiation therapy: Secondary | ICD-10-CM | POA: Diagnosis not present

## 2018-12-30 LAB — CBC WITH DIFFERENTIAL/PLATELET
Abs Immature Granulocytes: 0.28 10*3/uL — ABNORMAL HIGH (ref 0.00–0.07)
Basophils Absolute: 0 10*3/uL (ref 0.0–0.1)
Basophils Relative: 0 %
Eosinophils Absolute: 0.1 10*3/uL (ref 0.0–0.5)
Eosinophils Relative: 2 %
HCT: 25.8 % — ABNORMAL LOW (ref 36.0–46.0)
Hemoglobin: 8.9 g/dL — ABNORMAL LOW (ref 12.0–15.0)
Immature Granulocytes: 11 %
Lymphocytes Relative: 14 %
Lymphs Abs: 0.3 10*3/uL — ABNORMAL LOW (ref 0.7–4.0)
MCH: 29.8 pg (ref 26.0–34.0)
MCHC: 34.5 g/dL (ref 30.0–36.0)
MCV: 86.3 fL (ref 80.0–100.0)
Monocytes Absolute: 0.5 10*3/uL (ref 0.1–1.0)
Monocytes Relative: 20 %
Neutro Abs: 1.3 10*3/uL — ABNORMAL LOW (ref 1.7–7.7)
Neutrophils Relative %: 53 %
Platelets: 125 10*3/uL — ABNORMAL LOW (ref 150–400)
RBC: 2.99 MIL/uL — ABNORMAL LOW (ref 3.87–5.11)
RDW: 14.7 % (ref 11.5–15.5)
WBC: 2.5 10*3/uL — ABNORMAL LOW (ref 4.0–10.5)
nRBC: 0 % (ref 0.0–0.2)

## 2018-12-30 LAB — CMP (CANCER CENTER ONLY)
ALT: 27 U/L (ref 0–44)
AST: 27 U/L (ref 15–41)
Albumin: 3.5 g/dL (ref 3.5–5.0)
Alkaline Phosphatase: 76 U/L (ref 38–126)
Anion gap: 8 (ref 5–15)
BUN: 13 mg/dL (ref 6–20)
CO2: 24 mmol/L (ref 22–32)
Calcium: 9.1 mg/dL (ref 8.9–10.3)
Chloride: 101 mmol/L (ref 98–111)
Creatinine: 0.72 mg/dL (ref 0.44–1.00)
GFR, Est AFR Am: >60 mL/min (ref >60)
GFR, Estimated: >60 mL/min (ref >60)
Glucose, Bld: 99 mg/dL (ref 70–99)
Potassium: 4 mmol/L (ref 3.5–5.1)
Sodium: 133 mmol/L — ABNORMAL LOW (ref 135–145)
Total Bilirubin: <0.2 mg/dL — ABNORMAL LOW (ref 0.3–1.2)
Total Protein: 6.6 g/dL (ref 6.5–8.1)

## 2018-12-30 LAB — MAGNESIUM: Magnesium: 1.6 mg/dL — ABNORMAL LOW (ref 1.7–2.4)

## 2018-12-31 ENCOUNTER — Encounter: Payer: Self-pay | Admitting: Hematology and Oncology

## 2018-12-31 ENCOUNTER — Other Ambulatory Visit: Payer: Self-pay

## 2018-12-31 ENCOUNTER — Ambulatory Visit
Admission: RE | Admit: 2018-12-31 | Discharge: 2018-12-31 | Disposition: A | Discharge status: Home / Self Care | Payer: BLUE CROSS/BLUE SHIELD | Source: Ambulatory Visit | Attending: Radiation Oncology | Admitting: Radiation Oncology

## 2018-12-31 ENCOUNTER — Inpatient Hospital Stay (HOSPITAL_BASED_OUTPATIENT_CLINIC_OR_DEPARTMENT_OTHER): Payer: BLUE CROSS/BLUE SHIELD | Admitting: Hematology and Oncology

## 2018-12-31 DIAGNOSIS — Z5111 Encounter for antineoplastic chemotherapy: Secondary | ICD-10-CM | POA: Diagnosis not present

## 2018-12-31 DIAGNOSIS — D61810 Antineoplastic chemotherapy induced pancytopenia: Secondary | ICD-10-CM | POA: Diagnosis not present

## 2018-12-31 DIAGNOSIS — R634 Abnormal weight loss: Secondary | ICD-10-CM

## 2018-12-31 DIAGNOSIS — Z720 Tobacco use: Secondary | ICD-10-CM

## 2018-12-31 DIAGNOSIS — C531 Malignant neoplasm of exocervix: Secondary | ICD-10-CM | POA: Diagnosis not present

## 2018-12-31 DIAGNOSIS — Z79899 Other long term (current) drug therapy: Secondary | ICD-10-CM

## 2018-12-31 DIAGNOSIS — R197 Diarrhea, unspecified: Secondary | ICD-10-CM

## 2018-12-31 DIAGNOSIS — D61818 Other pancytopenia: Secondary | ICD-10-CM

## 2018-12-31 MED ORDER — MAGNESIUM OXIDE 400 (241.3 MG) MG PO TABS: 400.0000 mg | ORAL_TABLET | Freq: Every day | ORAL | 1 refills | Status: DC

## 2018-12-31 NOTE — Assessment & Plan Note (Signed)
This could be related to GI loss and cisplatin Recommend magnesium replacement

## 2018-12-31 NOTE — Assessment & Plan Note (Addendum)
She is attempting to quit smoking We discussed the importance of nicotine cessation

## 2018-12-31 NOTE — Assessment & Plan Note (Signed)
She has severe pancytopenia We will proceed with treatment tomorrow with dose adjustment She does not need transfusion support now She might need G-CSF support next week

## 2018-12-31 NOTE — Progress Notes (Signed)
[facility] OFFICE PROGRESS NOTE  Patient Care Team: Marguerita Merles, MD as PCP - General (Family Medicine) Gae Dry, MD as Referring Physician (Obstetrics and Gynecology) Clent Jacks, RN as Registered Nurse Awanda Mink, Craige Cotta, RN as Oncology Nurse Navigator (Oncology)  ASSESSMENT & PLAN:  Cervical ca [facility] Overall, her blood counts are improving She continues to have some mild weight loss due to cachexia We will proceed with cycle 4 tomorrow with dose adjustment I will monitor her blood count carefully and prescribe G-CSF support of blood transfusion if needed  Tobacco abuse She is attempting to quit smoking We discussed the importance of nicotine cessation  Hypomagnesemia This could be related to GI loss and cisplatin Recommend magnesium replacement  Pancytopenia, acquired ([place]) She has severe pancytopenia We will proceed with treatment tomorrow with dose adjustment She does not need transfusion support now She might need G-CSF support next week   No orders of the defined types were placed in this encounter.   INTERVAL HISTORY: Please see below for problem oriented charting. She returns to be seen prior to cycle 4 of chemo Her treatment has been delayed for almost 2 weeks due to pancytopenia She denies recent infection, fever or chills She continues to have mild weight loss but she felt that her appetite has improved She has mild intermittent diarrhea The patient denies any recent signs or symptoms of bleeding such as spontaneous epistaxis, hematuria or hematochezia. She continues to smoke but attempting to quit smoking  SUMMARY OF ONCOLOGIC HISTORY:   Cervical ca ([place])   11/03/2018 Initial Diagnosis    She presented with postmenopausal bleeding since 2019 (at least 1 year) and weight loss. Had prior abnormal PAP smear, last done in 2010    11/04/2018 Pathology Results    Endometrium, biopsy - INVASIVE MODERATELY DIFFERENTIATED SQUAMOUS CELL  CARCINOMA. SEE NOTE Diagnosis Note Squamous cell carcinoma arises in a background of high-grade dysplasia with possible glandular involvement. Tumor cells are positive for CK5/6, consistent with the above diagnosis. Immunohistochemical stain for p16 is diffusely positive in the tumor cells.    11/17/2018 Cancer Staging    Staging form: Cervix Uteri, AJCC 8th Edition - Clinical: FIGO Stage IIIB (cT3b, cN0, cM0) - Signed by Heath Lark, MD on 11/17/2018    11/20/2018 PET scan    IMPRESSION: 1. Cervical lesion with direct extra cervical extension posterolaterally on the right is markedly hypermetabolic consistent with known neoplasm. 2. 7 x 11 mm posterior right pelvic irregular nodule is hypermetabolic consistent with metastatic disease. 3. Para-aortic soft tissue in the region of the bifurcation and 8 mm short axis left pelvic sidewall lymph node show marked hypermetabolism, consistent with metastatic involvement. 4. Low level FDG uptake identified in a unenlarged AP window lymph node of the mediastinum with focal FDG uptake identified in each hilum. While no underlying hilar lymph nodes evident on this noncontrast CT exam, tiny lymph nodes were visible in the hilar regions on diagnostic CT of 11/17/2018. While this may be reactive given the low level uptake, metastatic disease cannot be definitively excluded. 5. Focus of hypermetabolic activity in the spinal canal at the T11-12 level. No underlying mass lesion evident on noncontrast CT imaging. Thoracic spine MRI without and with contrast may be warranted to further evaluate. 6. Stable moderate right hydroureteronephrosis.    11/25/2018 Procedure    Placement of single lumen port a cath via right internal jugular vein. The catheter tip lies at the cavo-atrial junction. A power injectable  port a cath was placed and is ready for immediate use.     11/26/2018 Imaging    MRI spine Negative for metastatic disease. No abnormality to correlate with  potential lesion within the spinal canal at T11-12 as seen on prior CT is identified.  Remote, mild superior endplate compression fracture of T12.  Widely patent central canal and foramina throughout.  Right hydronephrosis as seen on prior studies.    11/27/2018 -  Chemotherapy    The patient had weekly cisplatin with radiation     REVIEW OF SYSTEMS:   Constitutional: Denies fevers, chills or abnormal weight loss Eyes: Denies blurriness of vision Ears, nose, mouth, throat, and face: Denies mucositis or sore throat Respiratory: Denies cough, dyspnea or wheezes Cardiovascular: Denies palpitation, chest discomfort or lower extremity swelling Skin: Denies abnormal skin rashes Lymphatics: Denies new lymphadenopathy or easy bruising Neurological:Denies numbness, tingling or new weaknesses Behavioral/Psych: Mood is stable, no new changes  All other systems were reviewed with the patient and are negative.  I have reviewed the past medical history, past surgical history, social history and family history with the patient and they are unchanged from previous note.  ALLERGIES:  has No Known Allergies.  MEDICATIONS:  Current Outpatient Medications  Medication Sig Dispense Refill  . acetaminophen (TYLENOL) 500 MG tablet Take 1,000 mg by mouth every 6 (six) hours as needed (pain).    Marland Kitchen diphenoxylate-atropine (LOMOTIL) 2.5-0.025 MG tablet Take 1 tablet by mouth 4 (four) times daily as needed for diarrhea or loose stools. 30 tablet 0  . lidocaine-prilocaine (EMLA) cream Apply to affected area once (Patient taking differently: Apply 1 application topically daily as needed (local anesthetic). Apply to affected area once) 30 g 3  . magnesium oxide (MAG-OX) 400 (241.3 Mg) MG tablet Take 1 tablet (400 mg total) by mouth daily. 30 tablet 1  . morphine (MSIR) 15 MG tablet Take 1 tablet (15 mg total) by mouth every 4 (four) hours as needed for severe pain. 30 tablet 0  . ondansetron (ZOFRAN) 8 MG  tablet Take 1 tablet (8 mg total) by mouth every 8 (eight) hours as needed. Start on the third day after chemotherapy. 90 tablet 1  . prochlorperazine (COMPAZINE) 10 MG tablet Take 1 tablet (10 mg total) by mouth every 6 (six) hours as needed (Nausea or vomiting). 90 tablet 1  . tiZANidine (ZANAFLEX) 2 MG tablet Take 2 mg by mouth at bedtime as needed.      No current facility-administered medications for this visit.     PHYSICAL EXAMINATION: ECOG PERFORMANCE STATUS: 1 - Symptomatic but completely ambulatory  Vitals:   12/31/18 1203  BP: 121/87  Pulse: (!) 104  Resp: 18  Temp: 98.8 F (37.1 C)  SpO2: 100%   Filed Weights   12/31/18 1203  Weight: 113 lb 6.4 oz (51.4 kg)    GENERAL:alert, no distress and comfortable SKIN: skin color, texture, turgor are normal, no rashes or significant lesions EYES: normal, Conjunctiva are pink and non-injected, sclera clear OROPHARYNX:no exudate, no erythema and lips, buccal mucosa, and tongue normal  NECK: supple, thyroid normal size, non-tender, without nodularity LYMPH:  no palpable lymphadenopathy in the cervical, axillary or inguinal LUNGS: clear to auscultation and percussion with normal breathing effort HEART: regular rate & rhythm and no murmurs and no lower extremity edema ABDOMEN:abdomen soft, non-tender and normal bowel sounds Musculoskeletal:no cyanosis of digits and no clubbing  NEURO: alert & oriented x 3 with fluent speech, no focal motor/sensory deficits  LABORATORY  DATA:  I have reviewed the data as listed    Component Value Date/Time   NA 133 (L) 12/30/2018 1244   K 4.0 12/30/2018 1244   CL 101 12/30/2018 1244   CO2 24 12/30/2018 1244   GLUCOSE 99 12/30/2018 1244   BUN 13 12/30/2018 1244   CREATININE 0.72 12/30/2018 1244   CALCIUM 9.1 12/30/2018 1244   PROT 6.6 12/30/2018 1244   ALBUMIN 3.5 12/30/2018 1244   AST 27 12/30/2018 1244   ALT 27 12/30/2018 1244   ALKPHOS 76 12/30/2018 1244   BILITOT <0.2 (L) 12/30/2018  1244   GFRNONAA >60 12/30/2018 1244   GFRAA >60 12/30/2018 1244    No results found for: SPEP, UPEP  Lab Results  Component Value Date   WBC 2.5 (L) 12/30/2018   NEUTROABS 1.3 (L) 12/30/2018   HGB 8.9 (L) 12/30/2018   HCT 25.8 (L) 12/30/2018   MCV 86.3 12/30/2018   PLT 125 (L) 12/30/2018      Chemistry      Component Value Date/Time   NA 133 (L) 12/30/2018 1244   K 4.0 12/30/2018 1244   CL 101 12/30/2018 1244   CO2 24 12/30/2018 1244   BUN 13 12/30/2018 1244   CREATININE 0.72 12/30/2018 1244      Component Value Date/Time   CALCIUM 9.1 12/30/2018 1244   ALKPHOS 76 12/30/2018 1244   AST 27 12/30/2018 1244   ALT 27 12/30/2018 1244   BILITOT <0.2 (L) 12/30/2018 1244       All questions were answered. The patient knows to call the clinic with any problems, questions or concerns. No barriers to learning was detected.  I spent 20 minutes counseling the patient face to face. The total time spent in the appointment was 30 minutes and more than 50% was on counseling and review of test results  Heath Lark, MD 12/31/2018 1:30 PM

## 2018-12-31 NOTE — Assessment & Plan Note (Signed)
Overall, her blood counts are improving She continues to have some mild weight loss due to cachexia We will proceed with cycle 4 tomorrow with dose adjustment I will monitor her blood count carefully and prescribe G-CSF support of blood transfusion if needed

## 2019-01-01 ENCOUNTER — Inpatient Hospital Stay: Payer: BLUE CROSS/BLUE SHIELD

## 2019-01-01 ENCOUNTER — Telehealth: Payer: Self-pay | Admitting: *Deleted

## 2019-01-01 ENCOUNTER — Other Ambulatory Visit: Payer: Self-pay | Admitting: Hematology and Oncology

## 2019-01-01 ENCOUNTER — Ambulatory Visit
Admission: RE | Admit: 2019-01-01 | Discharge: 2019-01-01 | Disposition: A | Discharge status: Home / Self Care | Payer: BLUE CROSS/BLUE SHIELD | Source: Ambulatory Visit | Attending: Radiation Oncology | Admitting: Radiation Oncology

## 2019-01-01 VITALS — BP 120/75 | HR 94 | Temp 98.7°F | Resp 18 | Wt 111.5 lb

## 2019-01-01 DIAGNOSIS — C531 Malignant neoplasm of exocervix: Secondary | ICD-10-CM

## 2019-01-01 DIAGNOSIS — Z5111 Encounter for antineoplastic chemotherapy: Secondary | ICD-10-CM | POA: Diagnosis not present

## 2019-01-01 MED ORDER — SODIUM CHLORIDE 0.9 % IV SOLN
20.0000 mg/m2 | Freq: Once | INTRAVENOUS | Status: AC
  Administered 2019-01-01: 11:00:00 32 mg via INTRAVENOUS
  Filled 2019-01-01: qty 32

## 2019-01-01 MED ORDER — ACETAMINOPHEN 325 MG PO TABS
ORAL_TABLET | ORAL | Status: AC
  Filled 2019-01-01: qty 2

## 2019-01-01 MED ORDER — SODIUM CHLORIDE 0.9% FLUSH
10.0000 mL | INTRAVENOUS | Status: DC | PRN
  Administered 2019-01-01: 15:00:00 10 mL
  Filled 2019-01-01: qty 10

## 2019-01-01 MED ORDER — PALONOSETRON HCL INJECTION 0.25 MG/5ML
INTRAVENOUS | Status: AC
  Filled 2019-01-01: qty 5

## 2019-01-01 MED ORDER — ACETAMINOPHEN 325 MG PO TABS
650.0000 mg | ORAL_TABLET | Freq: Four times a day (QID) | ORAL | Status: DC | PRN
  Administered 2019-01-01: 10:00:00 650 mg via ORAL

## 2019-01-01 MED ORDER — POTASSIUM CHLORIDE 2 MEQ/ML IV SOLN
Freq: Once | INTRAVENOUS | Status: AC
  Administered 2019-01-01: 09:00:00 via INTRAVENOUS
  Filled 2019-01-01: qty 10

## 2019-01-01 MED ORDER — HEPARIN SOD (PORK) LOCK FLUSH 100 UNIT/ML IV SOLN
500.0000 [IU] | Freq: Once | INTRAVENOUS | Status: AC | PRN
  Administered 2019-01-01: 15:00:00 500 [IU]
  Filled 2019-01-01: qty 5

## 2019-01-01 MED ORDER — PALONOSETRON HCL INJECTION 0.25 MG/5ML
0.2500 mg | Freq: Once | INTRAVENOUS | Status: AC
  Administered 2019-01-01: 11:00:00 0.25 mg via INTRAVENOUS

## 2019-01-01 MED ORDER — SODIUM CHLORIDE 0.9 % IV SOLN
Freq: Once | INTRAVENOUS | Status: AC
  Administered 2019-01-01: 08:00:00 via INTRAVENOUS
  Filled 2019-01-01: qty 250

## 2019-01-01 MED ORDER — SODIUM CHLORIDE 0.9 % IV SOLN
Freq: Once | INTRAVENOUS | Status: AC
  Administered 2019-01-01: 11:00:00 via INTRAVENOUS
  Filled 2019-01-01: qty 5

## 2019-01-01 NOTE — Patient Instructions (Signed)
Coronavirus (COVID-19) Are you at risk?  Are you at risk for the Coronavirus (COVID-19)?  To be considered HIGH RISK for Coronavirus (COVID-19), you have to meet the following criteria:  . Traveled to [place], [place], [place], [place] or [place]; or in the [place] to [place], [place], [place], or [place]; and have fever, cough, and shortness of breath within the last 2 weeks of travel OR . Been in close contact with a person diagnosed with COVID-19 within the last 2 weeks and have fever, cough, and shortness of breath . IF YOU DO NOT MEET THESE CRITERIA, YOU ARE CONSIDERED LOW RISK FOR COVID-19.  What to do if you are HIGH RISK for COVID-19?  . If you are having a medical emergency, call 911. . Seek medical care right away. Before you go to a doctor's office, urgent care or emergency department, call ahead and tell them about your recent travel, contact with someone diagnosed with COVID-19, and your symptoms. You should receive instructions from your physician's office regarding next steps of care.  . When you arrive at healthcare provider, tell the healthcare staff immediately you have returned from visiting [place], [place], [place], [place] or [place]; or traveled in the [place] to [place], [place], [place], or [place]; in the last two weeks or you have been in close contact with a person diagnosed with COVID-19 in the last 2 weeks.   . Tell the health care staff about your symptoms: fever, cough and shortness of breath. . After you have been seen by a medical provider, you will be either: o Tested for (COVID-19) and discharged home on quarantine except to seek medical care if symptoms worsen, and asked to  - Stay home and avoid contact with others until you get your results (4-5 days)  - Avoid travel on public transportation if possible (such as bus, train, or airplane) or o Sent to the Emergency Department by EMS for evaluation, COVID-19 testing, and possible  admission depending on your condition and test results.  What to do if you are LOW RISK for COVID-19?  Reduce your risk of any infection by using the same precautions used for avoiding the common cold or flu:  . Wash your hands often with soap and warm water for at least 20 seconds.  If soap and water are not readily available, use an alcohol-based hand sanitizer with at least 60% alcohol.  . If coughing or sneezing, cover your mouth and nose by coughing or sneezing into the elbow areas of your shirt or coat, into a tissue or into your sleeve (not your hands). . Avoid shaking hands with others and consider head nods or verbal greetings only. . Avoid touching your eyes, nose, or mouth with unwashed hands.  . Avoid close contact with people who are sick. . Avoid places or events with large numbers of people in one location, like concerts or sporting events. . Carefully consider travel plans you have or are making. . If you are planning any travel outside or inside the [place], visit the CDC's Travelers' Health webpage for the latest health notices. . If you have some symptoms but not all symptoms, continue to monitor at home and seek medical attention if your symptoms worsen. . If you are having a medical emergency, call 911.   ADDITIONAL HEALTHCARE OPTIONS FOR PATIENTS  [facility] / e-Visit: [email or web address]         [facility]: [phone]  Onset   Urgent Care: [facility]: [facility] Discharge Instructions for Patients Receiving Chemotherapy  Today you received the following chemotherapy agents: Cisplatin (Platinol)  To help prevent nausea and vomiting after your treatment, we encourage you to take your nausea medication as directed.    If you develop nausea and vomiting that is not controlled by your nausea medication, call the clinic.    BELOW ARE SYMPTOMS THAT SHOULD BE REPORTED IMMEDIATELY:  *FEVER GREATER THAN 100.5 F  *CHILLS WITH OR WITHOUT FEVER  NAUSEA AND VOMITING THAT IS NOT CONTROLLED WITH YOUR NAUSEA MEDICATION  *UNUSUAL SHORTNESS OF BREATH  *UNUSUAL BRUISING OR BLEEDING  TENDERNESS IN MOUTH AND THROAT WITH OR WITHOUT PRESENCE OF ULCERS  *URINARY PROBLEMS  *BOWEL PROBLEMS  UNUSUAL RASH Items with * indicate a potential emergency and should be followed up as soon as possible.  Feel free to call the clinic should you have any questions or concerns. The clinic phone number is [phone].  Please show the [facility] at check-in to the Emergency Department and triage nurse.

## 2019-01-01 NOTE — Telephone Encounter (Signed)
CALLED PATIENT TO INFORM OF [employer] [place] [employer]ACEMENT[employer], LVM FOR A RETURN CALL

## 2019-01-02 ENCOUNTER — Other Ambulatory Visit (HOSPITAL_COMMUNITY): Payer: Self-pay | Admitting: Radiation Oncology

## 2019-01-02 DIAGNOSIS — C531 Malignant neoplasm of exocervix: Secondary | ICD-10-CM

## 2019-01-04 ENCOUNTER — Other Ambulatory Visit: Payer: Self-pay

## 2019-01-04 ENCOUNTER — Ambulatory Visit
Admission: RE | Admit: 2019-01-04 | Discharge: 2019-01-04 | Disposition: A | Discharge status: Home / Self Care | Payer: BLUE CROSS/BLUE SHIELD | Source: Ambulatory Visit | Attending: Radiation Oncology | Admitting: Radiation Oncology

## 2019-01-04 DIAGNOSIS — Z510 Encounter for antineoplastic radiation therapy: Secondary | ICD-10-CM | POA: Insufficient documentation

## 2019-01-04 DIAGNOSIS — C531 Malignant neoplasm of exocervix: Secondary | ICD-10-CM | POA: Insufficient documentation

## 2019-01-05 ENCOUNTER — Ambulatory Visit
Admission: RE | Admit: 2019-01-05 | Discharge: 2019-01-05 | Disposition: A | Discharge status: Home / Self Care | Payer: BLUE CROSS/BLUE SHIELD | Source: Ambulatory Visit | Attending: Radiation Oncology | Admitting: Radiation Oncology

## 2019-01-05 ENCOUNTER — Other Ambulatory Visit: Payer: Self-pay | Admitting: Radiation Oncology

## 2019-01-05 MED ORDER — MORPHINE SULFATE 15 MG PO TABS: 15.0000 mg | ORAL_TABLET | ORAL | 0 refills | Status: DC | PRN

## 2019-01-06 ENCOUNTER — Inpatient Hospital Stay: Payer: BLUE CROSS/BLUE SHIELD | Attending: Gynecologic Oncology

## 2019-01-06 ENCOUNTER — Ambulatory Visit
Admission: RE | Admit: 2019-01-06 | Discharge: 2019-01-06 | Disposition: A | Discharge status: Home / Self Care | Payer: BLUE CROSS/BLUE SHIELD | Source: Ambulatory Visit | Attending: Radiation Oncology | Admitting: Radiation Oncology

## 2019-01-06 ENCOUNTER — Other Ambulatory Visit: Payer: Self-pay

## 2019-01-06 DIAGNOSIS — D61818 Other pancytopenia: Secondary | ICD-10-CM | POA: Insufficient documentation

## 2019-01-06 DIAGNOSIS — Z720 Tobacco use: Secondary | ICD-10-CM | POA: Insufficient documentation

## 2019-01-06 DIAGNOSIS — R110 Nausea: Secondary | ICD-10-CM | POA: Insufficient documentation

## 2019-01-06 DIAGNOSIS — C531 Malignant neoplasm of exocervix: Secondary | ICD-10-CM | POA: Diagnosis present

## 2019-01-06 DIAGNOSIS — Z1159 Encounter for screening for other viral diseases: Secondary | ICD-10-CM | POA: Diagnosis not present

## 2019-01-06 DIAGNOSIS — Z5111 Encounter for antineoplastic chemotherapy: Secondary | ICD-10-CM | POA: Diagnosis present

## 2019-01-06 DIAGNOSIS — Z79899 Other long term (current) drug therapy: Secondary | ICD-10-CM | POA: Insufficient documentation

## 2019-01-06 LAB — CMP (CANCER CENTER ONLY)
ALT: 16 U/L (ref 0–44)
AST: 20 U/L (ref 15–41)
Albumin: 3.7 g/dL (ref 3.5–5.0)
Alkaline Phosphatase: 70 U/L (ref 38–126)
Anion gap: 10 (ref 5–15)
BUN: 15 mg/dL (ref 6–20)
CO2: 23 mmol/L (ref 22–32)
Calcium: 9.1 mg/dL (ref 8.9–10.3)
Chloride: 101 mmol/L (ref 98–111)
Creatinine: 0.71 mg/dL (ref 0.44–1.00)
GFR, Est AFR Am: >60 mL/min (ref >60)
GFR, Estimated: >60 mL/min (ref >60)
Glucose, Bld: 122 mg/dL — ABNORMAL HIGH (ref 70–99)
Potassium: 4 mmol/L (ref 3.5–5.1)
Sodium: 134 mmol/L — ABNORMAL LOW (ref 135–145)
Total Bilirubin: 0.2 mg/dL — ABNORMAL LOW (ref 0.3–1.2)
Total Protein: 6.9 g/dL (ref 6.5–8.1)

## 2019-01-06 LAB — CBC WITH DIFFERENTIAL/PLATELET
Abs Immature Granulocytes: 0.02 10*3/uL (ref 0.00–0.07)
Basophils Absolute: 0 10*3/uL (ref 0.0–0.1)
Basophils Relative: 0 %
Eosinophils Absolute: 0 10*3/uL (ref 0.0–0.5)
Eosinophils Relative: 1 %
HCT: 27.7 % — ABNORMAL LOW (ref 36.0–46.0)
Hemoglobin: 9.4 g/dL — ABNORMAL LOW (ref 12.0–15.0)
Immature Granulocytes: 1 %
Lymphocytes Relative: 6 %
Lymphs Abs: 0.2 10*3/uL — ABNORMAL LOW (ref 0.7–4.0)
MCH: 30 pg (ref 26.0–34.0)
MCHC: 33.9 g/dL (ref 30.0–36.0)
MCV: 88.5 fL (ref 80.0–100.0)
Monocytes Absolute: 0.6 10*3/uL (ref 0.1–1.0)
Monocytes Relative: 16 %
Neutro Abs: 2.9 10*3/uL (ref 1.7–7.7)
Neutrophils Relative %: 76 %
Platelets: 204 10*3/uL (ref 150–400)
RBC: 3.13 MIL/uL — ABNORMAL LOW (ref 3.87–5.11)
RDW: 16.6 % — ABNORMAL HIGH (ref 11.5–15.5)
WBC: 3.8 10*3/uL — ABNORMAL LOW (ref 4.0–10.5)
nRBC: 0 % (ref 0.0–0.2)

## 2019-01-06 LAB — MAGNESIUM: Magnesium: 1.6 mg/dL — ABNORMAL LOW (ref 1.7–2.4)

## 2019-01-07 ENCOUNTER — Ambulatory Visit
Admission: RE | Admit: 2019-01-07 | Discharge: 2019-01-07 | Disposition: A | Discharge status: Home / Self Care | Payer: BLUE CROSS/BLUE SHIELD | Source: Ambulatory Visit | Attending: Radiation Oncology | Admitting: Radiation Oncology

## 2019-01-07 ENCOUNTER — Encounter: Payer: Self-pay | Admitting: Hematology and Oncology

## 2019-01-07 ENCOUNTER — Telehealth: Payer: Self-pay | Admitting: Hematology and Oncology

## 2019-01-07 ENCOUNTER — Inpatient Hospital Stay (HOSPITAL_BASED_OUTPATIENT_CLINIC_OR_DEPARTMENT_OTHER): Payer: BLUE CROSS/BLUE SHIELD | Admitting: Hematology and Oncology

## 2019-01-07 DIAGNOSIS — C531 Malignant neoplasm of exocervix: Secondary | ICD-10-CM

## 2019-01-07 DIAGNOSIS — D61818 Other pancytopenia: Secondary | ICD-10-CM

## 2019-01-07 DIAGNOSIS — R110 Nausea: Secondary | ICD-10-CM

## 2019-01-07 DIAGNOSIS — Z720 Tobacco use: Secondary | ICD-10-CM

## 2019-01-07 DIAGNOSIS — Z79899 Other long term (current) drug therapy: Secondary | ICD-10-CM

## 2019-01-07 MED ORDER — PROCHLORPERAZINE MALEATE 10 MG PO TABS: 10.0000 mg | ORAL_TABLET | Freq: Four times a day (QID) | ORAL | 1 refills | Status: DC | PRN

## 2019-01-07 NOTE — Assessment & Plan Note (Signed)
She tolerated reduced dose treatment well Pancytopenia has resolved We will continue treatment at 50% dose adjustment

## 2019-01-07 NOTE — Progress Notes (Signed)
[facility] OFFICE PROGRESS NOTE  Patient Care Team: Marguerita Merles, MD as PCP - General (Family Medicine) Gae Dry, MD as Referring Physician (Obstetrics and Gynecology) Clent Jacks, RN as Registered Nurse Awanda Mink, Craige Cotta, RN as Oncology Nurse Navigator (Oncology)  ASSESSMENT & PLAN:  Cervical ca [facility]) She tolerated reduced dose chemotherapy well Her pancytopenia has improved She will continue radiation treatment I plan to give her final treatment next week I plan to recheck her blood work again early next week for further follow-up She agreed  Pancytopenia, acquired ([place]) She tolerated reduced dose treatment well Pancytopenia has resolved We will continue treatment at 50% dose adjustment  Nausea without vomiting Her nausea is well controlled with antiemetics I refilled her prescription Compazine as requested  Tobacco abuse We discussed the importance of nicotine cessation She is attempting to quit on her own   No orders of the defined types were placed in this encounter.   INTERVAL HISTORY: Please see below for problem oriented charting. She returns to be seen prior to cycle 5 of chemotherapy She tolerated reduced dose well No recent bleeding No recent infection, fever or chills She had some nausea but no vomiting She continues to smoke a few cigarettes per day  SUMMARY OF ONCOLOGIC HISTORY:   Cervical ca ([place])   11/03/2018 Initial Diagnosis    She presented with postmenopausal bleeding since 2019 (at least 1 year) and weight loss. Had prior abnormal PAP smear, last done in 2010    11/04/2018 Pathology Results    Endometrium, biopsy - INVASIVE MODERATELY DIFFERENTIATED SQUAMOUS CELL CARCINOMA. SEE NOTE Diagnosis Note Squamous cell carcinoma arises in a background of high-grade dysplasia with possible glandular involvement. Tumor cells are positive for CK5/6, consistent with the above diagnosis. Immunohistochemical stain for p16 is  diffusely positive in the tumor cells.    11/17/2018 Cancer Staging    Staging form: Cervix Uteri, AJCC 8th Edition - Clinical: FIGO Stage IIIB (cT3b, cN0, cM0) - Signed by Heath Lark, MD on 11/17/2018    11/20/2018 PET scan    IMPRESSION: 1. Cervical lesion with direct extra cervical extension posterolaterally on the right is markedly hypermetabolic consistent with known neoplasm. 2. 7 x 11 mm posterior right pelvic irregular nodule is hypermetabolic consistent with metastatic disease. 3. Para-aortic soft tissue in the region of the bifurcation and 8 mm short axis left pelvic sidewall lymph node show marked hypermetabolism, consistent with metastatic involvement. 4. Low level FDG uptake identified in a unenlarged AP window lymph node of the mediastinum with focal FDG uptake identified in each hilum. While no underlying hilar lymph nodes evident on this noncontrast CT exam, tiny lymph nodes were visible in the hilar regions on diagnostic CT of 11/17/2018. While this may be reactive given the low level uptake, metastatic disease cannot be definitively excluded. 5. Focus of hypermetabolic activity in the spinal canal at the T11-12 level. No underlying mass lesion evident on noncontrast CT imaging. Thoracic spine MRI without and with contrast may be warranted to further evaluate. 6. Stable moderate right hydroureteronephrosis.    11/25/2018 Procedure    Placement of single lumen port a cath via right internal jugular vein. The catheter tip lies at the cavo-atrial junction. A power injectable port a cath was placed and is ready for immediate use.     11/26/2018 Imaging    MRI spine Negative for metastatic disease. No abnormality to correlate with potential lesion within the spinal canal at T11-12 as seen on  prior CT is identified.  Remote, mild superior endplate compression fracture of T12.  Widely patent central canal and foramina throughout.  Right hydronephrosis as seen on prior studies.     11/27/2018 -  Chemotherapy    The patient had weekly cisplatin with radiation     REVIEW OF SYSTEMS:   Constitutional: Denies fevers, chills or abnormal weight loss Eyes: Denies blurriness of vision Ears, nose, mouth, throat, and face: Denies mucositis or sore throat Respiratory: Denies cough, dyspnea or wheezes Cardiovascular: Denies palpitation, chest discomfort or lower extremity swelling Gastrointestinal:  Denies nausea, heartburn or change in bowel habits Skin: Denies abnormal skin rashes Lymphatics: Denies new lymphadenopathy or easy bruising Neurological:Denies numbness, tingling or new weaknesses Behavioral/Psych: Mood is stable, no new changes  All other systems were reviewed with the patient and are negative.  I have reviewed the past medical history, past surgical history, social history and family history with the patient and they are unchanged from previous note.  ALLERGIES:  has No Known Allergies.  MEDICATIONS:  Current Outpatient Medications  Medication Sig Dispense Refill  . acetaminophen (TYLENOL) 500 MG tablet Take 1,000 mg by mouth every 6 (six) hours as needed (pain).    Marland Kitchen diphenoxylate-atropine (LOMOTIL) 2.5-0.025 MG tablet Take 1 tablet by mouth 4 (four) times daily as needed for diarrhea or loose stools. 30 tablet 0  . lidocaine-prilocaine (EMLA) cream Apply to affected area once (Patient taking differently: Apply 1 application topically daily as needed (local anesthetic). Apply to affected area once) 30 g 3  . magnesium oxide (MAG-OX) 400 (241.3 Mg) MG tablet Take 1 tablet (400 mg total) by mouth daily. 30 tablet 1  . morphine (MSIR) 15 MG tablet Take 1 tablet (15 mg total) by mouth every 4 (four) hours as needed for severe pain. 30 tablet 0  . ondansetron (ZOFRAN) 8 MG tablet Take 1 tablet (8 mg total) by mouth every 8 (eight) hours as needed. Start on the third day after chemotherapy. 90 tablet 1  . prochlorperazine (COMPAZINE) 10 MG tablet Take 1 tablet  (10 mg total) by mouth every 6 (six) hours as needed (Nausea or vomiting). 90 tablet 1  . tiZANidine (ZANAFLEX) 2 MG tablet Take 2 mg by mouth at bedtime as needed.      No current facility-administered medications for this visit.     PHYSICAL EXAMINATION: ECOG PERFORMANCE STATUS: 1 - Symptomatic but completely ambulatory  Vitals:   01/07/19 1252  BP: 130/67  Pulse: 98  Resp: 19  Temp: 98.3 F (36.8 C)  SpO2: 100%   Filed Weights   01/07/19 1252  Weight: 111 lb 9.6 oz (50.6 kg)    GENERAL:alert, no distress and comfortable SKIN: skin color, texture, turgor are normal, no rashes or significant lesions EYES: normal, Conjunctiva are pink and non-injected, sclera clear OROPHARYNX:no exudate, no erythema and lips, buccal mucosa, and tongue normal  NECK: supple, thyroid normal size, non-tender, without nodularity LYMPH:  no palpable lymphadenopathy in the cervical, axillary or inguinal LUNGS: clear to auscultation and percussion with normal breathing effort HEART: regular rate & rhythm and no murmurs and no lower extremity edema ABDOMEN:abdomen soft, non-tender and normal bowel sounds Musculoskeletal:no cyanosis of digits and no clubbing  NEURO: alert & oriented x 3 with fluent speech, no focal motor/sensory deficits  LABORATORY DATA:  I have reviewed the data as listed    Component Value Date/Time   NA 134 (L) 01/06/2019 1253   K 4.0 01/06/2019 1253   CL 101 01/06/2019  1253   CO2 23 01/06/2019 1253   GLUCOSE 122 (H) 01/06/2019 1253   BUN 15 01/06/2019 1253   CREATININE 0.71 01/06/2019 1253   CALCIUM 9.1 01/06/2019 1253   PROT 6.9 01/06/2019 1253   ALBUMIN 3.7 01/06/2019 1253   AST 20 01/06/2019 1253   ALT 16 01/06/2019 1253   ALKPHOS 70 01/06/2019 1253   BILITOT 0.2 (L) 01/06/2019 1253   GFRNONAA >60 01/06/2019 1253   GFRAA >60 01/06/2019 1253    No results found for: SPEP, UPEP  Lab Results  Component Value Date   WBC 3.8 (L) 01/06/2019   NEUTROABS 2.9  01/06/2019   HGB 9.4 (L) 01/06/2019   HCT 27.7 (L) 01/06/2019   MCV 88.5 01/06/2019   PLT 204 01/06/2019      Chemistry      Component Value Date/Time   NA 134 (L) 01/06/2019 1253   K 4.0 01/06/2019 1253   CL 101 01/06/2019 1253   CO2 23 01/06/2019 1253   BUN 15 01/06/2019 1253   CREATININE 0.71 01/06/2019 1253      Component Value Date/Time   CALCIUM 9.1 01/06/2019 1253   ALKPHOS 70 01/06/2019 1253   AST 20 01/06/2019 1253   ALT 16 01/06/2019 1253   BILITOT 0.2 (L) 01/06/2019 1253      All questions were answered. The patient knows to call the clinic with any problems, questions or concerns. No barriers to learning was detected.  I spent 15 minutes counseling the patient face to face. The total time spent in the appointment was 20 minutes and more than 50% was on counseling and review of test results  Heath Lark, MD 01/07/2019 1:15 PM

## 2019-01-07 NOTE — Assessment & Plan Note (Signed)
Her nausea is well controlled with antiemetics I refilled her prescription Compazine as requested

## 2019-01-07 NOTE — Telephone Encounter (Signed)
Scheduled appts per sch msg. Called and left msg  

## 2019-01-07 NOTE — Assessment & Plan Note (Signed)
She tolerated reduced dose chemotherapy well Her pancytopenia has improved She will continue radiation treatment I plan to give her final treatment next week I plan to recheck her blood work again early next week for further follow-up She agreed

## 2019-01-07 NOTE — Assessment & Plan Note (Signed)
We discussed the importance of nicotine cessation She is attempting to quit on her own

## 2019-01-08 ENCOUNTER — Ambulatory Visit
Admission: RE | Admit: 2019-01-08 | Discharge: 2019-01-08 | Disposition: A | Discharge status: Home / Self Care | Payer: BLUE CROSS/BLUE SHIELD | Source: Ambulatory Visit | Attending: Radiation Oncology | Admitting: Radiation Oncology

## 2019-01-08 ENCOUNTER — Other Ambulatory Visit: Payer: Self-pay

## 2019-01-08 ENCOUNTER — Telehealth: Payer: Self-pay | Admitting: Radiation Oncology

## 2019-01-08 NOTE — Telephone Encounter (Signed)
Received voicemail message from patient that she is unable to pick up script written by Dr. Sondra Come because the pharmacy needs "some kinda code." Phoned Lattie Haw at [facility] in [place]. She requested an ICD 10 code because the script is for morphine outside of normal parameters. Provided Lattie Haw with code c53.1. Phoned patient back. Explained code has been provided to [place] and she can pick up her medication now. Patient verbalized understanding and expressed appreciation for the call back and help.

## 2019-01-11 ENCOUNTER — Ambulatory Visit
Admission: RE | Admit: 2019-01-11 | Discharge: 2019-01-11 | Disposition: A | Discharge status: Home / Self Care | Payer: BLUE CROSS/BLUE SHIELD | Source: Ambulatory Visit | Attending: Radiation Oncology | Admitting: Radiation Oncology

## 2019-01-11 ENCOUNTER — Inpatient Hospital Stay: Payer: BLUE CROSS/BLUE SHIELD

## 2019-01-13 ENCOUNTER — Inpatient Hospital Stay: Payer: BLUE CROSS/BLUE SHIELD

## 2019-01-13 ENCOUNTER — Other Ambulatory Visit: Payer: Self-pay

## 2019-01-13 VITALS — BP 112/68 | HR 87 | Temp 98.1°F

## 2019-01-13 DIAGNOSIS — C531 Malignant neoplasm of exocervix: Secondary | ICD-10-CM | POA: Diagnosis not present

## 2019-01-13 MED ORDER — HEPARIN SOD (PORK) LOCK FLUSH 100 UNIT/ML IV SOLN
500.0000 [IU] | Freq: Once | INTRAVENOUS | Status: AC | PRN
  Administered 2019-01-13: 500 [IU]
  Filled 2019-01-13: qty 5

## 2019-01-13 MED ORDER — PALONOSETRON HCL INJECTION 0.25 MG/5ML
INTRAVENOUS | Status: AC
  Filled 2019-01-13: qty 5

## 2019-01-13 MED ORDER — SODIUM CHLORIDE 0.9 % IV SOLN
Freq: Once | INTRAVENOUS | Status: AC
  Administered 2019-01-13: 13:00:00 via INTRAVENOUS
  Filled 2019-01-13: qty 5

## 2019-01-13 MED ORDER — POTASSIUM CHLORIDE 2 MEQ/ML IV SOLN
Freq: Once | INTRAVENOUS | Status: AC
  Administered 2019-01-13: 11:00:00 via INTRAVENOUS
  Filled 2019-01-13: qty 10

## 2019-01-13 MED ORDER — SODIUM CHLORIDE 0.9 % IV SOLN
20.0000 mg/m2 | Freq: Once | INTRAVENOUS | Status: AC
  Administered 2019-01-13: 32 mg via INTRAVENOUS
  Filled 2019-01-13: qty 32

## 2019-01-13 MED ORDER — SODIUM CHLORIDE 0.9 % IV SOLN
Freq: Once | INTRAVENOUS | Status: AC
  Administered 2019-01-13: 11:00:00 via INTRAVENOUS
  Filled 2019-01-13: qty 250

## 2019-01-13 MED ORDER — SODIUM CHLORIDE 0.9% FLUSH
10.0000 mL | INTRAVENOUS | Status: DC | PRN
  Administered 2019-01-13: 18:00:00 10 mL
  Filled 2019-01-13: qty 10

## 2019-01-13 MED ORDER — PALONOSETRON HCL INJECTION 0.25 MG/5ML
0.2500 mg | Freq: Once | INTRAVENOUS | Status: AC
  Administered 2019-01-13: 0.25 mg via INTRAVENOUS

## 2019-01-13 NOTE — Progress Notes (Signed)
Per Dr. Alvy Bimler, okay to use lab work from 6/3 for today's treatment.

## 2019-01-13 NOTE — Patient Instructions (Signed)
   [facility] Discharge Instructions for Patients Receiving Chemotherapy  Today you received the following chemotherapy agents: Cisplatin (Platinol)  To help prevent nausea and vomiting after your treatment, we encourage you to take your nausea medication as directed.    If you develop nausea and vomiting that is not controlled by your nausea medication, call the clinic.   BELOW ARE SYMPTOMS THAT SHOULD BE REPORTED IMMEDIATELY:  *FEVER GREATER THAN 100.5 F  *CHILLS WITH OR WITHOUT FEVER  NAUSEA AND VOMITING THAT IS NOT CONTROLLED WITH YOUR NAUSEA MEDICATION  *UNUSUAL SHORTNESS OF BREATH  *UNUSUAL BRUISING OR BLEEDING  TENDERNESS IN MOUTH AND THROAT WITH OR WITHOUT PRESENCE OF ULCERS  *URINARY PROBLEMS  *BOWEL PROBLEMS  UNUSUAL RASH Items with * indicate a potential emergency and should be followed up as soon as possible.  Feel free to call the clinic should you have any questions or concerns. The clinic phone number is [phone].  Please show the [facility] at check-in to the Emergency Department and triage nurse.

## 2019-01-18 ENCOUNTER — Inpatient Hospital Stay (HOSPITAL_BASED_OUTPATIENT_CLINIC_OR_DEPARTMENT_OTHER): Payer: BLUE CROSS/BLUE SHIELD | Admitting: Hematology and Oncology

## 2019-01-18 ENCOUNTER — Encounter: Payer: Self-pay | Admitting: Hematology and Oncology

## 2019-01-18 ENCOUNTER — Inpatient Hospital Stay: Payer: BLUE CROSS/BLUE SHIELD

## 2019-01-18 ENCOUNTER — Other Ambulatory Visit: Payer: Self-pay

## 2019-01-18 ENCOUNTER — Other Ambulatory Visit (HOSPITAL_COMMUNITY)
Admission: RE | Admit: 2019-01-18 | Discharge: 2019-01-18 | Disposition: A | Discharge status: Home / Self Care | Payer: BLUE CROSS/BLUE SHIELD | Source: Ambulatory Visit | Attending: Radiation Oncology | Admitting: Radiation Oncology

## 2019-01-18 DIAGNOSIS — Z1159 Encounter for screening for other viral diseases: Secondary | ICD-10-CM | POA: Insufficient documentation

## 2019-01-18 DIAGNOSIS — C531 Malignant neoplasm of exocervix: Secondary | ICD-10-CM

## 2019-01-18 DIAGNOSIS — Z720 Tobacco use: Secondary | ICD-10-CM | POA: Diagnosis not present

## 2019-01-18 DIAGNOSIS — D61818 Other pancytopenia: Secondary | ICD-10-CM

## 2019-01-18 DIAGNOSIS — N1330 Unspecified hydronephrosis: Secondary | ICD-10-CM | POA: Diagnosis not present

## 2019-01-18 LAB — CMP (CANCER CENTER ONLY)
ALT: 33 U/L (ref 0–44)
AST: 29 U/L (ref 15–41)
Albumin: 3.6 g/dL (ref 3.5–5.0)
Alkaline Phosphatase: 68 U/L (ref 38–126)
Anion gap: 9 (ref 5–15)
BUN: 11 mg/dL (ref 6–20)
CO2: 24 mmol/L (ref 22–32)
Calcium: 9.2 mg/dL (ref 8.9–10.3)
Chloride: 103 mmol/L (ref 98–111)
Creatinine: 0.84 mg/dL (ref 0.44–1.00)
GFR, Est AFR Am: >60 mL/min (ref >60)
GFR, Estimated: >60 mL/min (ref >60)
Glucose, Bld: 84 mg/dL (ref 70–99)
Potassium: 4.2 mmol/L (ref 3.5–5.1)
Sodium: 136 mmol/L (ref 135–145)
Total Bilirubin: 0.3 mg/dL (ref 0.3–1.2)
Total Protein: 6.7 g/dL (ref 6.5–8.1)

## 2019-01-18 LAB — CBC WITH DIFFERENTIAL/PLATELET
Abs Immature Granulocytes: 0.03 10*3/uL (ref 0.00–0.07)
Basophils Absolute: 0 10*3/uL (ref 0.0–0.1)
Basophils Relative: 0 %
Eosinophils Absolute: 0 10*3/uL (ref 0.0–0.5)
Eosinophils Relative: 1 %
HCT: 25.3 % — ABNORMAL LOW (ref 36.0–46.0)
Hemoglobin: 8.5 g/dL — ABNORMAL LOW (ref 12.0–15.0)
Immature Granulocytes: 1 %
Lymphocytes Relative: 9 %
Lymphs Abs: 0.3 10*3/uL — ABNORMAL LOW (ref 0.7–4.0)
MCH: 31.1 pg (ref 26.0–34.0)
MCHC: 33.6 g/dL (ref 30.0–36.0)
MCV: 92.7 fL (ref 80.0–100.0)
Monocytes Absolute: 0.4 10*3/uL (ref 0.1–1.0)
Monocytes Relative: 10 %
Neutro Abs: 3 10*3/uL (ref 1.7–7.7)
Neutrophils Relative %: 79 %
Platelets: 82 10*3/uL — ABNORMAL LOW (ref 150–400)
RBC: 2.73 MIL/uL — ABNORMAL LOW (ref 3.87–5.11)
RDW: 20.1 % — ABNORMAL HIGH (ref 11.5–15.5)
WBC: 3.8 10*3/uL — ABNORMAL LOW (ref 4.0–10.5)
nRBC: 0 % (ref 0.0–0.2)

## 2019-01-18 LAB — MAGNESIUM: Magnesium: 1.7 mg/dL (ref 1.7–2.4)

## 2019-01-18 NOTE — Assessment & Plan Note (Signed)
She tolerated chemotherapy poorly with more weight loss We will stop chemotherapy She will finish the rest of radiation treatment She will get labs and port flush every 8 weeks I plan to order a PET CT scan approximately 3 months from the last day of her future radiation treatment

## 2019-01-18 NOTE — Progress Notes (Signed)
[facility] OFFICE PROGRESS NOTE  Patient Care Team: Marguerita Merles, MD as PCP - General (Family Medicine) Gae Dry, MD as Referring Physician (Obstetrics and Gynecology) Clent Jacks, RN as Registered Nurse Awanda Mink, Craige Cotta, RN as Oncology Nurse Navigator (Oncology)  ASSESSMENT & PLAN:  Cervical ca [facility]) She tolerated chemotherapy poorly with more weight loss We will stop chemotherapy She will finish the rest of radiation treatment She will get labs and port flush every 8 weeks I plan to order a PET CT scan approximately 3 months from the last day of her future radiation treatment  Pancytopenia, acquired [facility]) She has persistent pancytopenia due to treatment but not symptomatic She does not need transfusion support Observe only We will recheck her blood count again in a few weeks  Hydronephrosis of right kidney Renal function is normal We will recheck with PET CT scan in 3 months  Tobacco abuse She continues to smoke We discussed the importance of nicotine cessation   Orders Placed This Encounter  Procedures  . NM PET Image Restag (PS) Skull Base To Thigh    Standing Status:   Future    Standing Expiration Date:   01/18/2020    Order Specific Question:   If indicated for the ordered procedure, I authorize the administration of a radiopharmaceutical per Radiology protocol    Answer:   Yes    Order Specific Question:   Preferred imaging location?    Answer:   [facility]    Order Specific Question:   Radiology Contrast Protocol - do NOT remove file path    Answer:   [email or web address]    Order Specific Question:   Is the patient pregnant?    Answer:   No    INTERVAL HISTORY: Please see below for problem oriented charting. She returns for further follow-up She completed chemotherapy last week Her taste has improved She lost some weight since last time I saw her Denies nausea vomiting She continues to smoke  several cigarettes per day Denies recent fever or chills No recent bleeding  SUMMARY OF ONCOLOGIC HISTORY: Oncology History  Cervical ca ([place])  11/03/2018 Initial Diagnosis   She presented with postmenopausal bleeding since 2019 (at least 1 year) and weight loss. Had prior abnormal PAP smear, last done in 2010   11/04/2018 Pathology Results   Endometrium, biopsy - INVASIVE MODERATELY DIFFERENTIATED SQUAMOUS CELL CARCINOMA. SEE NOTE Diagnosis Note Squamous cell carcinoma arises in a background of high-grade dysplasia with possible glandular involvement. Tumor cells are positive for CK5/6, consistent with the above diagnosis. Immunohistochemical stain for p16 is diffusely positive in the tumor cells.   11/17/2018 Cancer Staging   Staging form: Cervix Uteri, AJCC 8th Edition - Clinical: FIGO Stage IIIB (cT3b, cN0, cM0) - Signed by Heath Lark, MD on 11/17/2018   11/20/2018 PET scan   IMPRESSION: 1. Cervical lesion with direct extra cervical extension posterolaterally on the right is markedly hypermetabolic consistent with known neoplasm. 2. 7 x 11 mm posterior right pelvic irregular nodule is hypermetabolic consistent with metastatic disease. 3. Para-aortic soft tissue in the region of the bifurcation and 8 mm short axis left pelvic sidewall lymph node show marked hypermetabolism, consistent with metastatic involvement. 4. Low level FDG uptake identified in a unenlarged AP window lymph node of the mediastinum with focal FDG uptake identified in each hilum. While no underlying hilar lymph nodes evident on this noncontrast CT exam, tiny lymph nodes were visible in the hilar regions on diagnostic  CT of 11/17/2018. While this may be reactive given the low level uptake, metastatic disease cannot be definitively excluded. 5. Focus of hypermetabolic activity in the spinal canal at the T11-12 level. No underlying mass lesion evident on noncontrast CT imaging. Thoracic spine MRI without and with contrast may  be warranted to further evaluate. 6. Stable moderate right hydroureteronephrosis.   11/25/2018 Procedure   Placement of single lumen port a cath via right internal jugular vein. The catheter tip lies at the cavo-atrial junction. A power injectable port a cath was placed and is ready for immediate use.    11/26/2018 Imaging   MRI spine Negative for metastatic disease. No abnormality to correlate with potential lesion within the spinal canal at T11-12 as seen on prior CT is identified.  Remote, mild superior endplate compression fracture of T12.  Widely patent central canal and foramina throughout.  Right hydronephrosis as seen on prior studies.   11/27/2018 - 01/13/2019 Chemotherapy   The patient had weekly cisplatin with radiation. Last 3 doses were delayed and dose modified due to pancytopenia     REVIEW OF SYSTEMS:   Constitutional: Denies fevers, chills Eyes: Denies blurriness of vision Ears, nose, mouth, throat, and face: Denies mucositis or sore throat Respiratory: Denies cough, dyspnea or wheezes Cardiovascular: Denies palpitation, chest discomfort or lower extremity swelling Gastrointestinal:  Denies nausea, heartburn or change in bowel habits Skin: Denies abnormal skin rashes Lymphatics: Denies new lymphadenopathy or easy bruising Neurological:Denies numbness, tingling or new weaknesses Behavioral/Psych: Mood is stable, no new changes  All other systems were reviewed with the patient and are negative.  I have reviewed the past medical history, past surgical history, social history and family history with the patient and they are unchanged from previous note.  ALLERGIES:  has No Known Allergies.  MEDICATIONS:  Current Outpatient Medications  Medication Sig Dispense Refill  . acetaminophen (TYLENOL) 500 MG tablet Take 1,000 mg by mouth every 6 (six) hours as needed (pain).    Marland Kitchen diphenoxylate-atropine (LOMOTIL) 2.5-0.025 MG tablet Take 1 tablet by mouth 4 (four) times  daily as needed for diarrhea or loose stools. 30 tablet 0  . magnesium oxide (MAG-OX) 400 (241.3 Mg) MG tablet Take 1 tablet (400 mg total) by mouth daily. 30 tablet 1  . morphine (MSIR) 15 MG tablet Take 1 tablet (15 mg total) by mouth every 4 (four) hours as needed for severe pain. 30 tablet 0  . tiZANidine (ZANAFLEX) 2 MG tablet Take 2 mg by mouth at bedtime as needed.      No current facility-administered medications for this visit.     PHYSICAL EXAMINATION: ECOG PERFORMANCE STATUS: 1 - Symptomatic but completely ambulatory  Vitals:   01/18/19 1026  BP: 120/79  Pulse: (!) 104  Resp: 18  Temp: 98 F (36.7 C)  SpO2: 100%   Filed Weights   01/18/19 1026  Weight: 109 lb 9.6 oz (49.7 kg)    GENERAL:alert, no distress and comfortable Musculoskeletal:no cyanosis of digits and no clubbing  NEURO: alert & oriented x 3 with fluent speech, no focal motor/sensory deficits  LABORATORY DATA:  I have reviewed the data as listed    Component Value Date/Time   NA 136 01/18/2019 0951   K 4.2 01/18/2019 0951   CL 103 01/18/2019 0951   CO2 24 01/18/2019 0951   GLUCOSE 84 01/18/2019 0951   BUN 11 01/18/2019 0951   CREATININE 0.84 01/18/2019 0951   CALCIUM 9.2 01/18/2019 0951   PROT 6.7 01/18/2019 0951  ALBUMIN 3.6 01/18/2019 0951   AST 29 01/18/2019 0951   ALT 33 01/18/2019 0951   ALKPHOS 68 01/18/2019 0951   BILITOT 0.3 01/18/2019 0951   GFRNONAA >60 01/18/2019 0951   GFRAA >60 01/18/2019 0951    No results found for: SPEP, UPEP  Lab Results  Component Value Date   WBC 3.8 (L) 01/18/2019   NEUTROABS 3.0 01/18/2019   HGB 8.5 (L) 01/18/2019   HCT 25.3 (L) 01/18/2019   MCV 92.7 01/18/2019   PLT 82 (L) 01/18/2019      Chemistry      Component Value Date/Time   NA 136 01/18/2019 0951   K 4.2 01/18/2019 0951   CL 103 01/18/2019 0951   CO2 24 01/18/2019 0951   BUN 11 01/18/2019 0951   CREATININE 0.84 01/18/2019 0951      Component Value Date/Time   CALCIUM 9.2  01/18/2019 0951   ALKPHOS 68 01/18/2019 0951   AST 29 01/18/2019 0951   ALT 33 01/18/2019 0951   BILITOT 0.3 01/18/2019 0951       All questions were answered. The patient knows to call the clinic with any problems, questions or concerns. No barriers to learning was detected.  I spent 15 minutes counseling the patient face to face. The total time spent in the appointment was 20 minutes and more than 50% was on counseling and review of test results  Heath Lark, MD 01/18/2019 12:17 PM

## 2019-01-18 NOTE — Assessment & Plan Note (Signed)
Renal function is normal We will recheck with PET CT scan in 3 months

## 2019-01-18 NOTE — Assessment & Plan Note (Signed)
She has persistent pancytopenia due to treatment but not symptomatic She does not need transfusion support Observe only We will recheck her blood count again in a few weeks

## 2019-01-18 NOTE — Assessment & Plan Note (Signed)
She continues to smoke °We discussed the importance of nicotine cessation °

## 2019-01-19 ENCOUNTER — Encounter (HOSPITAL_BASED_OUTPATIENT_CLINIC_OR_DEPARTMENT_OTHER): Payer: Self-pay | Admitting: *Deleted

## 2019-01-19 ENCOUNTER — Telehealth: Payer: Self-pay | Admitting: Hematology and Oncology

## 2019-01-19 LAB — NOVEL CORONAVIRUS, NAA (HOSP ORDER, SEND-OUT TO REF LAB; TAT 18-24 HRS): SARS-CoV-2, NAA: NOT DETECTED

## 2019-01-19 NOTE — Telephone Encounter (Signed)
I could not reach patient will mail

## 2019-01-20 NOTE — Progress Notes (Signed)
After multiple attempts and left voice messages unable to reach pt via phone .  Left voice mail for pt on her cell phone with instructions to arrive at 0915 tomorrow and be npo after mn.  Address / directions given, visitor restriction policy and wear mask.  Pt has current lab results dated 01-18-2019 in epic and chart.  Pt will needs history in epic completed and medication list completed in pre-op dos.

## 2019-01-21 ENCOUNTER — Ambulatory Visit (HOSPITAL_BASED_OUTPATIENT_CLINIC_OR_DEPARTMENT_OTHER): Payer: BLUE CROSS/BLUE SHIELD | Admitting: Anesthesiology

## 2019-01-21 ENCOUNTER — Encounter (HOSPITAL_BASED_OUTPATIENT_CLINIC_OR_DEPARTMENT_OTHER): Payer: Self-pay

## 2019-01-21 ENCOUNTER — Ambulatory Visit (HOSPITAL_COMMUNITY)
Admission: RE | Admit: 2019-01-21 | Discharge: 2019-01-21 | Disposition: A | Discharge status: Home / Self Care | Payer: BLUE CROSS/BLUE SHIELD | Source: Ambulatory Visit | Attending: Radiation Oncology | Admitting: Radiation Oncology

## 2019-01-21 ENCOUNTER — Ambulatory Visit
Admission: RE | Admit: 2019-01-21 | Discharge: 2019-01-21 | Disposition: A | Discharge status: Home / Self Care | Payer: BLUE CROSS/BLUE SHIELD | Source: Ambulatory Visit | Attending: Radiation Oncology | Admitting: Radiation Oncology

## 2019-01-21 ENCOUNTER — Encounter (HOSPITAL_BASED_OUTPATIENT_CLINIC_OR_DEPARTMENT_OTHER)
Admission: RE | Disposition: A | Discharge status: Still In Hospital | Payer: Self-pay | Source: Home / Self Care | Attending: Radiation Oncology

## 2019-01-21 ENCOUNTER — Ambulatory Visit (HOSPITAL_BASED_OUTPATIENT_CLINIC_OR_DEPARTMENT_OTHER)
Admission: RE | Admit: 2019-01-21 | Discharge: 2019-01-21 | Disposition: A | Discharge status: Still In Hospital | Payer: BLUE CROSS/BLUE SHIELD | Attending: Radiation Oncology | Admitting: Radiation Oncology

## 2019-01-21 ENCOUNTER — Other Ambulatory Visit: Payer: Self-pay

## 2019-01-21 VITALS — BP 114/56 | HR 65 | Temp 98.1°F | Resp 18

## 2019-01-21 DIAGNOSIS — Z923 Personal history of irradiation: Secondary | ICD-10-CM | POA: Diagnosis not present

## 2019-01-21 DIAGNOSIS — Z801 Family history of malignant neoplasm of trachea, bronchus and lung: Secondary | ICD-10-CM | POA: Insufficient documentation

## 2019-01-21 DIAGNOSIS — F17210 Nicotine dependence, cigarettes, uncomplicated: Secondary | ICD-10-CM | POA: Diagnosis not present

## 2019-01-21 DIAGNOSIS — Z9221 Personal history of antineoplastic chemotherapy: Secondary | ICD-10-CM | POA: Insufficient documentation

## 2019-01-21 DIAGNOSIS — C531 Malignant neoplasm of exocervix: Secondary | ICD-10-CM | POA: Insufficient documentation

## 2019-01-21 DIAGNOSIS — D61818 Other pancytopenia: Secondary | ICD-10-CM | POA: Diagnosis not present

## 2019-01-21 DIAGNOSIS — Z809 Family history of malignant neoplasm, unspecified: Secondary | ICD-10-CM | POA: Insufficient documentation

## 2019-01-21 HISTORY — DX: Other pancytopenia: D61.818

## 2019-01-21 HISTORY — PX: TANDEM RING INSERTION: SHX6199

## 2019-01-21 HISTORY — DX: Personal history of irradiation: Z92.3

## 2019-01-21 HISTORY — DX: Malignant neoplasm of cervix uteri, unspecified: C53.9

## 2019-01-21 HISTORY — DX: Unspecified hydronephrosis: N13.30

## 2019-01-21 HISTORY — DX: Wedge compression fracture of T11-T12 vertebra, initial encounter for closed fracture: S22.080A

## 2019-01-21 HISTORY — DX: Personal history of antineoplastic chemotherapy: Z92.21

## 2019-01-21 HISTORY — PX: OPERATIVE ULTRASOUND: SHX5996

## 2019-01-21 SURGERY — INSERTION, UTERINE TANDEM AND RING OR CYLINDER, FOR BRACHYTHERAPY
Anesthesia: General | Site: Vagina

## 2019-01-21 MED ORDER — SCOPOLAMINE 1 MG/3DAYS TD PT72
MEDICATED_PATCH | TRANSDERMAL | Status: DC | PRN
  Administered 2019-01-21: 1 via TRANSDERMAL

## 2019-01-21 MED ORDER — SCOPOLAMINE 1 MG/3DAYS TD PT72
MEDICATED_PATCH | TRANSDERMAL | Status: AC
  Filled 2019-01-21: qty 1

## 2019-01-21 MED ORDER — PROPOFOL 10 MG/ML IV BOLUS
INTRAVENOUS | Status: AC
  Filled 2019-01-21: qty 20

## 2019-01-21 MED ORDER — LIDOCAINE 2% (20 MG/ML) 5 ML SYRINGE
INTRAMUSCULAR | Status: DC | PRN
  Administered 2019-01-21: 40 mg via INTRAVENOUS

## 2019-01-21 MED ORDER — ACETAMINOPHEN 10 MG/ML IV SOLN
INTRAVENOUS | Status: DC | PRN
  Administered 2019-01-21: 1000 mg via INTRAVENOUS

## 2019-01-21 MED ORDER — FENTANYL CITRATE (PF) 100 MCG/2ML IJ SOLN
INTRAMUSCULAR | Status: DC | PRN
  Administered 2019-01-21 (×2): 25 ug via INTRAVENOUS

## 2019-01-21 MED ORDER — DEXAMETHASONE SODIUM PHOSPHATE 4 MG/ML IJ SOLN
INTRAMUSCULAR | Status: DC | PRN
  Administered 2019-01-21: 5 mg via INTRAVENOUS

## 2019-01-21 MED ORDER — LIDOCAINE 2% (20 MG/ML) 5 ML SYRINGE
INTRAMUSCULAR | Status: AC
  Filled 2019-01-21: qty 5

## 2019-01-21 MED ORDER — MIDAZOLAM HCL 5 MG/5ML IJ SOLN
INTRAMUSCULAR | Status: DC | PRN
  Administered 2019-01-21: 1 mg via INTRAVENOUS

## 2019-01-21 MED ORDER — DEXAMETHASONE SODIUM PHOSPHATE 10 MG/ML IJ SOLN
INTRAMUSCULAR | Status: AC
  Filled 2019-01-21: qty 1

## 2019-01-21 MED ORDER — PHENYLEPHRINE 40 MCG/ML (10ML) SYRINGE FOR IV PUSH (FOR BLOOD PRESSURE SUPPORT)
PREFILLED_SYRINGE | INTRAVENOUS | Status: AC
  Filled 2019-01-21: qty 10

## 2019-01-21 MED ORDER — ACETAMINOPHEN 10 MG/ML IV SOLN
INTRAVENOUS | Status: AC
  Filled 2019-01-21: qty 100

## 2019-01-21 MED ORDER — FENTANYL CITRATE (PF) 100 MCG/2ML IJ SOLN
INTRAMUSCULAR | Status: AC
  Filled 2019-01-21: qty 2

## 2019-01-21 MED ORDER — PROPOFOL 10 MG/ML IV BOLUS
INTRAVENOUS | Status: DC | PRN
  Administered 2019-01-21: 100 mg via INTRAVENOUS

## 2019-01-21 MED ORDER — PHENYLEPHRINE HCL (PRESSORS) 10 MG/ML IV SOLN
INTRAVENOUS | Status: DC | PRN
  Administered 2019-01-21 (×3): 80 ug via INTRAVENOUS

## 2019-01-21 MED ORDER — KETOROLAC TROMETHAMINE 30 MG/ML IJ SOLN
INTRAMUSCULAR | Status: DC | PRN
  Administered 2019-01-21: 30 mg via INTRAVENOUS

## 2019-01-21 MED ORDER — HYDROMORPHONE HCL 1 MG/ML IJ SOLN
0.5000 mg | Freq: Once | INTRAMUSCULAR | Status: AC
  Administered 2019-01-21: 14:00:00 1 mg via INTRAVENOUS
  Filled 2019-01-21: qty 1

## 2019-01-21 MED ORDER — METOCLOPRAMIDE HCL 5 MG/ML IJ SOLN
10.0000 mg | Freq: Once | INTRAMUSCULAR | Status: DC | PRN
  Filled 2019-01-21: qty 2

## 2019-01-21 MED ORDER — ONDANSETRON HCL 4 MG/2ML IJ SOLN
INTRAMUSCULAR | Status: AC
  Filled 2019-01-21: qty 2

## 2019-01-21 MED ORDER — FENTANYL CITRATE (PF) 100 MCG/2ML IJ SOLN
25.0000 ug | INTRAMUSCULAR | Status: DC | PRN
  Filled 2019-01-21: qty 1

## 2019-01-21 MED ORDER — MEPERIDINE HCL 25 MG/ML IJ SOLN
6.2500 mg | INTRAMUSCULAR | Status: DC | PRN
  Filled 2019-01-21: qty 1

## 2019-01-21 MED ORDER — HYDROMORPHONE HCL 1 MG/ML IJ SOLN
0.5000 mg | Freq: Once | INTRAMUSCULAR | Status: AC
  Administered 2019-01-21: 1 mg via INTRAVENOUS
  Filled 2019-01-21: qty 1

## 2019-01-21 MED ORDER — SODIUM CHLORIDE 0.9 % IR SOLN
Status: DC | PRN
  Administered 2019-01-21: 200 mL

## 2019-01-21 MED ORDER — MIDAZOLAM HCL 2 MG/2ML IJ SOLN
INTRAMUSCULAR | Status: AC
  Filled 2019-01-21: qty 2

## 2019-01-21 MED ORDER — LACTATED RINGERS IV SOLN
INTRAVENOUS | Status: DC
  Administered 2019-01-21: 14:00:00 via INTRAVENOUS
  Filled 2019-01-21 (×2): qty 250

## 2019-01-21 MED ORDER — LACTATED RINGERS IV SOLN
INTRAVENOUS | Status: DC
  Filled 2019-01-21: qty 1000

## 2019-01-21 MED ORDER — LACTATED RINGERS IV SOLN
INTRAVENOUS | Status: DC
  Administered 2019-01-21: 125 mL/h via INTRAVENOUS
  Filled 2019-01-21: qty 1000

## 2019-01-21 SURGICAL SUPPLY — 20 items
BNDG CONFORM 2 STRL LF (GAUZE/BANDAGES/DRESSINGS) IMPLANT
COVER WAND RF STERILE (DRAPES) IMPLANT
DILATOR CANAL MILEX (MISCELLANEOUS) IMPLANT
GAUZE 4X4 16PLY RFD (DISPOSABLE) ×4 IMPLANT
GLOVE BIO SURGEON STRL SZ7.5 (GLOVE) ×8 IMPLANT
GOWN STRL REUS W/TWL LRG LVL3 (GOWN DISPOSABLE) ×4 IMPLANT
HOLDER FOLEY CATH W/STRAP (MISCELLANEOUS) ×4 IMPLANT
HOVERMATT SINGLE USE (MISCELLANEOUS) ×4 IMPLANT
IV NS 1000ML (IV SOLUTION) ×2
IV NS 1000ML BAXH (IV SOLUTION) ×2 IMPLANT
IV SET EXTENSION GRAVITY 40 LF (IV SETS) ×4 IMPLANT
KIT TURNOVER CYSTO (KITS) ×4 IMPLANT
PACK VAGINAL MINOR WOMEN LF (CUSTOM PROCEDURE TRAY) ×4 IMPLANT
PACKING VAGINAL (PACKING) IMPLANT
PAD ABD 8X10 STRL (GAUZE/BANDAGES/DRESSINGS) ×4 IMPLANT
PAD OB MATERNITY 4.3X12.25 (PERSONAL CARE ITEMS) IMPLANT
TOWEL OR 17X26 10 PK STRL BLUE (TOWEL DISPOSABLE) ×4 IMPLANT
TRAY FOLEY W/BAG SLVR 14FR (SET/KITS/TRAYS/PACK) IMPLANT
TRAY FOLEY W/BAG SLVR 14FR LF (SET/KITS/TRAYS/PACK) ×4 IMPLANT
WATER STERILE IRR 500ML POUR (IV SOLUTION) ×4 IMPLANT

## 2019-01-21 NOTE — Transfer of Care (Signed)
Immediate Anesthesia Transfer of Care Note  Patient: Jennifer Santos  Procedure(s) Performed: TANDEM RING INSERTION (N/A Vagina ) OPERATIVE ULTRASOUND (N/A )  Patient Location: PACU  Anesthesia Type:General  Level of Consciousness: awake, alert , oriented and patient cooperative  Airway & Oxygen Therapy: Patient Spontanous Breathing and Patient connected to nasal cannula oxygen  Post-op Assessment: Report given to RN and Post -op Vital signs reviewed and stable  Post vital signs: Reviewed and stable  Last Vitals:  Vitals Value Taken Time  BP 109/67 01/21/19 1221  Temp    Pulse 76 01/21/19 1222  Resp 15 01/21/19 1222  SpO2 100 % 01/21/19 1222  Vitals shown include unvalidated device data.  Last Pain:  Vitals:   01/21/19 0952  TempSrc:   PainSc: 0-No pain      Patients Stated Pain Goal: 6 (29/79/89 2119)  Complications: No apparent anesthesia complications

## 2019-01-21 NOTE — H&P (View-Only) (Signed)
Radiation Oncology         [phone] ________________________________  History and physical examination  Name: Jennifer Santos MRN: [id number]  Date: 01/21/2019  DOB: [birth year]   DIAGNOSIS: Stage IIIb Clinical Squamous Cell Carcinoma of the Cervix   HISTORY OF PRESENT ILLNESS::Jennifer Santos is a 53 y.o. female who  presented with postmenopausal bleeding. She reports it began approximately 9 months to a year ago as light bleeding every week. The bleeding increased, and she passed some very heavy clots about 9 weeks ago. It was at this point that she sought medical attention, and she subsequently met with Dr. Kenton Kingfisher on 11/04/2018. He performed Pap smear and biopsy in the office that day. Pap smear revealed malignant cells, and the endometrium biopsy confirmed invasive moderately differentiated squamous cell carcinoma.   She met with Dr. Alycia Rossetti on 11/11/2018. Per Dr. Elenora Gamma note, pelvic examination shows: external genitalia within normal limits; the vagina is well but supported but there was a large crater-like mass at the top of the vaginal apex; there is no normal cervix identifiable; on bimanual examination there is sidewall involvement of an 8 cm mass on the left side; the right sidewall is free but there is parametrial involvement on the right side but it does not extend to the sidewall, rectal confirms.  She underwent chest/abdomen/pelvis CT scans on 11/17/2018. These revealed: irregular enlargement of the cervix with extension of soft tissue from the cervix into the posterior presacral space along the side of the rectum, wall thickening in the upper vagina is compatible with tumor involvement, the endometrial cavity of the uterus is markedly expanded and fluid-filled consistent with obstruction, no definite involvement of the posterior bladder wall evident by CT; moderate right hydroureteronephrosis with ureteral dilatation extending down to the right pelvic sidewall adjacent to the cervix;  abnormal, amorphous soft tissue surrounds the distal aorta and common iliac arteries, imaging appearance concerning for tumor spread; no evidence for metastatic disease in the chest.  Patient has recently completed her course of external beam radiation therapy along with radiosensitizing chemotherapy.  She is now ready to proceed with intracavitary brachii therapy treatments to complete her definitive course of therapy.     PAST MEDICAL HISTORY:  has a past medical history of Acquired pancytopenia ([place]), Cervical cancer, FIGO stage IIIB [facility]) (oncologist-- dr gorsuch/  dr Sondra Come), Compression fracture of T12 vertebra [facility]), History of cancer chemotherapy (11-27-2018  to 01-13-2019), History of external beam radiation therapy (11-30-2018  to 01-11-2019), and Hydronephrosis, right.    PAST SURGICAL HISTORY: Past Surgical History:  Procedure Laterality Date  . BACK SURGERY  1985   Removal of superficial mass  . IR IMAGING GUIDED PORT INSERTION  11/25/2018  . TUBAL LIGATION      FAMILY HISTORY: family history includes Brain cancer in her father; Cancer in her father; Lung cancer in her father.  SOCIAL HISTORY:  reports that she has been smoking cigarettes. She has been smoking about 0.50 packs per day. She has never used smokeless tobacco. She reports current alcohol use. She reports current drug use. Drugs: Marijuana and Cocaine.  ALLERGIES: Patient has no known allergies.  MEDICATIONS:  No current facility-administered medications for this encounter.    Current Outpatient Medications  Medication Sig Dispense Refill  . acetaminophen (TYLENOL) 500 MG tablet Take 1,000 mg by mouth every 6 (six) hours as needed (pain).    Marland Kitchen diphenoxylate-atropine (LOMOTIL) 2.5-0.025 MG tablet Take 1 tablet by mouth 4 (four) times daily as needed for diarrhea or  loose stools. 30 tablet 0  . magnesium oxide (MAG-OX) 400 (241.3 Mg) MG tablet Take 1 tablet (400 mg total) by mouth daily. 30 tablet 1  . morphine  (MSIR) 15 MG tablet Take 1 tablet (15 mg total) by mouth every 4 (four) hours as needed for severe pain. 30 tablet 0  . tiZANidine (ZANAFLEX) 2 MG tablet Take 2 mg by mouth at bedtime as needed.       REVIEW OF SYSTEMS:  A 10+ POINT REVIEW OF SYSTEMS WAS OBTAINED including neurology, dermatology, psychiatry, cardiac, respiratory, lymph, extremities, GI, GU, musculoskeletal, constitutional, reproductive, HEENT.  Vaginal bleeding has subsided.  Pelvic pain is improved but still present at this time.  PHYSICAL EXAM:  General: Alert and oriented, in no acute distress HEENT: Head is normocephalic. Extraocular movements are intact. Oropharynx is clear. Neck: Neck is supple, no palpable cervical or supraclavicular lymphadenopathy. Heart: Regular in rate and rhythm with no murmurs, rubs, or gallops. Chest: Clear to auscultation bilaterally, with no rhonchi, wheezes, or rales. Abdomen: Soft, nontender, nondistended, with no rigidity or guarding. Extremities: No cyanosis or edema. Lymphatics: see Neck Exam Skin: No concerning lesions. Musculoskeletal: symmetric strength and muscle tone throughout. Neurologic: Cranial nerves II through XII are grossly intact. No obvious focalities. Speech is fluent. Coordination is intact. Psychiatric: Judgment and insight are intact. Affect is appropriate. On pelvic examination the external genitalia were unremarkable.  Limited pelvic exam in clinic last week revealed the cervical mass had decreased in size.  No active bleeding noted.  Exam was uncomfortable for the patient while awake.  More detailed exam will be performed under anesthesia.   ECOG = 1  0 - Asymptomatic (Fully active, able to carry on all predisease activities without restriction)  1 - Symptomatic but completely ambulatory (Restricted in physically strenuous activity but ambulatory and able to carry out work of a light or sedentary nature. For example, light housework, office work)  2 -  Symptomatic, <50% in bed during the day (Ambulatory and capable of all self care but unable to carry out any work activities. Up and about more than 50% of waking hours)  3 - Symptomatic, >50% in bed, but not bedbound (Capable of only limited self-care, confined to bed or chair 50% or more of waking hours)  4 - Bedbound (Completely disabled. Cannot carry on any self-care. Totally confined to bed or chair)  5 - Death   Eustace Pen MM, Creech RH, Tormey DC, et al. [phone]). "Toxicity and response criteria of the [facility]". [place] Oncol. 5 (6): 649-55  LABORATORY DATA:  Lab Results  Component Value Date   WBC 3.8 (L) 01/18/2019   HGB 8.5 (L) 01/18/2019   HCT 25.3 (L) 01/18/2019   MCV 92.7 01/18/2019   PLT 82 (L) 01/18/2019   NEUTROABS 3.0 01/18/2019   Lab Results  Component Value Date   NA 136 01/18/2019   K 4.2 01/18/2019   CL 103 01/18/2019   CO2 24 01/18/2019   GLUCOSE 84 01/18/2019   CREATININE 0.84 01/18/2019   CALCIUM 9.2 01/18/2019         IMPRESSION: Stage IIIb Clinical Squamous Cell Carcinoma of the Cervix Patient is now ready to proceed with intracavitary brachii therapy treatments to complete her definitive course of therapy.  Procedure explained to the patient she appears to understand well.   PLAN: Patient will be taken the operating room on June 18 for exam under anesthesia and placement of brachii therapy equipment in preparation for  high-dose-rate radiation therapy with iridium 192 as the high-dose-rate source.  Plan is for the patient to receive 5 high-dose-rate treatments directed at the cervical region.    ------------------------------------------------  Blair Promise, PhD, MD  This document serves as a record of services personally performed by Gery Pray, MD. It was created on his behalf by Wilburn Mylar, a trained medical scribe. The creation of this record is based on the scribe's personal observations and the provider's  statements to them. This document has been checked and approved by the attending provider.

## 2019-01-21 NOTE — H&P (View-Only) (Signed)
Radiation Oncology         (336) [phone] ________________________________  History and physical examination  Name: Jennifer Santos MRN: [id number]  Date: 01/21/2019  DOB: 24-Jan-1966   DIAGNOSIS: Stage IIIb Clinical Squamous Cell Carcinoma of the Cervix   HISTORY OF PRESENT ILLNESS::Jennifer Santos is a 53 y.o. female who  presented with postmenopausal bleeding. She reports it began approximately 9 months to a year ago as light bleeding every week. The bleeding increased, and she passed some very heavy clots about 9 weeks ago. It was at this point that she sought medical attention, and she subsequently met with Dr. Kenton Kingfisher on 11/04/2018. He performed Pap smear and biopsy in the office that day. Pap smear revealed malignant cells, and the endometrium biopsy confirmed invasive moderately differentiated squamous cell carcinoma.   She met with Dr. Alycia Rossetti on 11/11/2018. Per Dr. Elenora Gamma note, pelvic examination shows: external genitalia within normal limits; the vagina is well but supported but there was a large crater-like mass at the top of the vaginal apex; there is no normal cervix identifiable; on bimanual examination there is sidewall involvement of an 8 cm mass on the left side; the right sidewall is free but there is parametrial involvement on the right side but it does not extend to the sidewall, rectal confirms.  She underwent chest/abdomen/pelvis CT scans on 11/17/2018. These revealed: irregular enlargement of the cervix with extension of soft tissue from the cervix into the posterior presacral space along the side of the rectum, wall thickening in the upper vagina is compatible with tumor involvement, the endometrial cavity of the uterus is markedly expanded and fluid-filled consistent with obstruction, no definite involvement of the posterior bladder wall evident by CT; moderate right hydroureteronephrosis with ureteral dilatation extending down to the right pelvic sidewall adjacent to the cervix;  abnormal, amorphous soft tissue surrounds the distal aorta and common iliac arteries, imaging appearance concerning for tumor spread; no evidence for metastatic disease in the chest.  Patient has recently completed her course of external beam radiation therapy along with radiosensitizing chemotherapy.  She is now ready to proceed with intracavitary brachii therapy treatments to complete her definitive course of therapy.     PAST MEDICAL HISTORY:  has a past medical history of Acquired pancytopenia ([place]), Cervical cancer, FIGO stage IIIB [facility]) (oncologist-- dr gorsuch/  dr Sondra Come), Compression fracture of T12 vertebra [facility]), History of cancer chemotherapy (11-27-2018  to 01-13-2019), History of external beam radiation therapy (11-30-2018  to 01-11-2019), and Hydronephrosis, right.    PAST SURGICAL HISTORY: Past Surgical History:  Procedure Laterality Date  . BACK SURGERY  1985   Removal of superficial mass  . IR IMAGING GUIDED PORT INSERTION  11/25/2018  . TUBAL LIGATION      FAMILY HISTORY: family history includes Brain cancer in her father; Cancer in her father; Lung cancer in her father.  SOCIAL HISTORY:  reports that she has been smoking cigarettes. She has been smoking about 0.50 packs per day. She has never used smokeless tobacco. She reports current alcohol use. She reports current drug use. Drugs: Marijuana and Cocaine.  ALLERGIES: Patient has no known allergies.  MEDICATIONS:  No current facility-administered medications for this encounter.    Current Outpatient Medications  Medication Sig Dispense Refill  . acetaminophen (TYLENOL) 500 MG tablet Take 1,000 mg by mouth every 6 (six) hours as needed (pain).    Marland Kitchen diphenoxylate-atropine (LOMOTIL) 2.5-0.025 MG tablet Take 1 tablet by mouth 4 (four) times daily as needed for diarrhea or  loose stools. 30 tablet 0  . magnesium oxide (MAG-OX) 400 (241.3 Mg) MG tablet Take 1 tablet (400 mg total) by mouth daily. 30 tablet 1  . morphine  (MSIR) 15 MG tablet Take 1 tablet (15 mg total) by mouth every 4 (four) hours as needed for severe pain. 30 tablet 0  . tiZANidine (ZANAFLEX) 2 MG tablet Take 2 mg by mouth at bedtime as needed.       REVIEW OF SYSTEMS:  A 10+ POINT REVIEW OF SYSTEMS WAS OBTAINED including neurology, dermatology, psychiatry, cardiac, respiratory, lymph, extremities, GI, GU, musculoskeletal, constitutional, reproductive, HEENT.  Vaginal bleeding has subsided.  Pelvic pain is improved but still present at this time.  PHYSICAL EXAM:  General: Alert and oriented, in no acute distress HEENT: Head is normocephalic. Extraocular movements are intact. Oropharynx is clear. Neck: Neck is supple, no palpable cervical or supraclavicular lymphadenopathy. Heart: Regular in rate and rhythm with no murmurs, rubs, or gallops. Chest: Clear to auscultation bilaterally, with no rhonchi, wheezes, or rales. Abdomen: Soft, nontender, nondistended, with no rigidity or guarding. Extremities: No cyanosis or edema. Lymphatics: see Neck Exam Skin: No concerning lesions. Musculoskeletal: symmetric strength and muscle tone throughout. Neurologic: Cranial nerves II through XII are grossly intact. No obvious focalities. Speech is fluent. Coordination is intact. Psychiatric: Judgment and insight are intact. Affect is appropriate. On pelvic examination the external genitalia were unremarkable.  Limited pelvic exam in clinic last week revealed the cervical mass had decreased in size.  No active bleeding noted.  Exam was uncomfortable for the patient while awake.  More detailed exam will be performed under anesthesia.   ECOG = 1  0 - Asymptomatic (Fully active, able to carry on all predisease activities without restriction)  1 - Symptomatic but completely ambulatory (Restricted in physically strenuous activity but ambulatory and able to carry out work of a light or sedentary nature. For example, light housework, office work)  2 -  Symptomatic, <50% in bed during the day (Ambulatory and capable of all self care but unable to carry out any work activities. Up and about more than 50% of waking hours)  3 - Symptomatic, >50% in bed, but not bedbound (Capable of only limited self-care, confined to bed or chair 50% or more of waking hours)  4 - Bedbound (Completely disabled. Cannot carry on any self-care. Totally confined to bed or chair)  5 - Death   Eustace Pen MM, Creech RH, Tormey DC, et al. [phone]). "Toxicity and response criteria of the [facility]". [place] Oncol. 5 (6): 649-55  LABORATORY DATA:  Lab Results  Component Value Date   WBC 3.8 (L) 01/18/2019   HGB 8.5 (L) 01/18/2019   HCT 25.3 (L) 01/18/2019   MCV 92.7 01/18/2019   PLT 82 (L) 01/18/2019   NEUTROABS 3.0 01/18/2019   Lab Results  Component Value Date   NA 136 01/18/2019   K 4.2 01/18/2019   CL 103 01/18/2019   CO2 24 01/18/2019   GLUCOSE 84 01/18/2019   CREATININE 0.84 01/18/2019   CALCIUM 9.2 01/18/2019         IMPRESSION: Stage IIIb Clinical Squamous Cell Carcinoma of the Cervix Patient is now ready to proceed with intracavitary brachii therapy treatments to complete her definitive course of therapy.  Procedure explained to the patient she appears to understand well.   PLAN: Patient will be taken the operating room on June 18 for exam under anesthesia and placement of brachii therapy equipment in preparation for  high-dose-rate radiation therapy with iridium 192 as the high-dose-rate source.  Plan is for the patient to receive 5 high-dose-rate treatments directed at the cervical region.    ------------------------------------------------  Blair Promise, PhD, MD  This document serves as a record of services personally performed by Gery Pray, MD. It was created on his behalf by Wilburn Mylar, a trained medical scribe. The creation of this record is based on the scribe's personal observations and the provider's  statements to them. This document has been checked and approved by the attending provider.

## 2019-01-21 NOTE — Progress Notes (Signed)
Patient discharged to home tolerated removal well pain medication administered prior to removal escorted via w/c to son.

## 2019-01-21 NOTE — H&P (Signed)
Radiation Oncology         [phone]) [phone] ________________________________  History and physical examination  Name: Jennifer Santos MRN: [id number]  Date: 01/21/2019  DOB: [birth year]   DIAGNOSIS: Stage IIIb Clinical Squamous Cell Carcinoma of the Cervix   HISTORY OF PRESENT ILLNESS::Jennifer Santos is a 53 y.o. female who  presented with postmenopausal bleeding. She reports it began approximately 9 months to a year ago as light bleeding every week. The bleeding increased, and she passed some very heavy clots about 9 weeks ago. It was at this point that she sought medical attention, and she subsequently met with Dr. Kenton Kingfisher on 11/04/2018. He performed Pap smear and biopsy in the office that day. Pap smear revealed malignant cells, and the endometrium biopsy confirmed invasive moderately differentiated squamous cell carcinoma.   She met with Dr. Alycia Rossetti on 11/11/2018. Per Dr. Elenora Gamma note, pelvic examination shows: external genitalia within normal limits; the vagina is well but supported but there was a large crater-like mass at the top of the vaginal apex; there is no normal cervix identifiable; on bimanual examination there is sidewall involvement of an 8 cm mass on the left side; the right sidewall is free but there is parametrial involvement on the right side but it does not extend to the sidewall, rectal confirms.  She underwent chest/abdomen/pelvis CT scans on 11/17/2018. These revealed: irregular enlargement of the cervix with extension of soft tissue from the cervix into the posterior presacral space along the side of the rectum, wall thickening in the upper vagina is compatible with tumor involvement, the endometrial cavity of the uterus is markedly expanded and fluid-filled consistent with obstruction, no definite involvement of the posterior bladder wall evident by CT; moderate right hydroureteronephrosis with ureteral dilatation extending down to the right pelvic sidewall adjacent to the cervix;  abnormal, amorphous soft tissue surrounds the distal aorta and common iliac arteries, imaging appearance concerning for tumor spread; no evidence for metastatic disease in the chest.  Patient has recently completed her course of external beam radiation therapy along with radiosensitizing chemotherapy.  She is now ready to proceed with intracavitary brachii therapy treatments to complete her definitive course of therapy.     PAST MEDICAL HISTORY:  has a past medical history of Acquired pancytopenia ([place]), Cervical cancer, FIGO stage IIIB [facility]) (oncologist-- dr gorsuch/  dr Sondra Come), Compression fracture of T12 vertebra [facility]), History of cancer chemotherapy (11-27-2018  to 01-13-2019), History of external beam radiation therapy (11-30-2018  to 01-11-2019), and Hydronephrosis, right.    PAST SURGICAL HISTORY: Past Surgical History:  Procedure Laterality Date  . BACK SURGERY  1985   Removal of superficial mass  . IR IMAGING GUIDED PORT INSERTION  11/25/2018  . TUBAL LIGATION      FAMILY HISTORY: family history includes Brain cancer in her father; Cancer in her father; Lung cancer in her father.  SOCIAL HISTORY:  reports that she has been smoking cigarettes. She has been smoking about 0.50 packs per day. She has never used smokeless tobacco. She reports current alcohol use. She reports current drug use. Drugs: Marijuana and Cocaine.  ALLERGIES: Patient has no known allergies.  MEDICATIONS:  No current facility-administered medications for this encounter.    Current Outpatient Medications  Medication Sig Dispense Refill  . acetaminophen (TYLENOL) 500 MG tablet Take 1,000 mg by mouth every 6 (six) hours as needed (pain).    Marland Kitchen diphenoxylate-atropine (LOMOTIL) 2.5-0.025 MG tablet Take 1 tablet by mouth 4 (four) times daily as needed for diarrhea or  loose stools. 30 tablet 0  . magnesium oxide (MAG-OX) 400 (241.3 Mg) MG tablet Take 1 tablet (400 mg total) by mouth daily. 30 tablet 1  . morphine  (MSIR) 15 MG tablet Take 1 tablet (15 mg total) by mouth every 4 (four) hours as needed for severe pain. 30 tablet 0  . tiZANidine (ZANAFLEX) 2 MG tablet Take 2 mg by mouth at bedtime as needed.       REVIEW OF SYSTEMS:  A 10+ POINT REVIEW OF SYSTEMS WAS OBTAINED including neurology, dermatology, psychiatry, cardiac, respiratory, lymph, extremities, GI, GU, musculoskeletal, constitutional, reproductive, HEENT.  Vaginal bleeding has subsided.  Pelvic pain is improved but still present at this time.  PHYSICAL EXAM:  General: Alert and oriented, in no acute distress HEENT: Head is normocephalic. Extraocular movements are intact. Oropharynx is clear. Neck: Neck is supple, no palpable cervical or supraclavicular lymphadenopathy. Heart: Regular in rate and rhythm with no murmurs, rubs, or gallops. Chest: Clear to auscultation bilaterally, with no rhonchi, wheezes, or rales. Abdomen: Soft, nontender, nondistended, with no rigidity or guarding. Extremities: No cyanosis or edema. Lymphatics: see Neck Exam Skin: No concerning lesions. Musculoskeletal: symmetric strength and muscle tone throughout. Neurologic: Cranial nerves II through XII are grossly intact. No obvious focalities. Speech is fluent. Coordination is intact. Psychiatric: Judgment and insight are intact. Affect is appropriate. On pelvic examination the external genitalia were unremarkable.  Limited pelvic exam in clinic last week revealed the cervical mass had decreased in size.  No active bleeding noted.  Exam was uncomfortable for the patient while awake.  More detailed exam will be performed under anesthesia.   ECOG = 1  0 - Asymptomatic (Fully active, able to carry on all predisease activities without restriction)  1 - Symptomatic but completely ambulatory (Restricted in physically strenuous activity but ambulatory and able to carry out work of a light or sedentary nature. For example, light housework, office work)  2 -  Symptomatic, <50% in bed during the day (Ambulatory and capable of all self care but unable to carry out any work activities. Up and about more than 50% of waking hours)  3 - Symptomatic, >50% in bed, but not bedbound (Capable of only limited self-care, confined to bed or chair 50% or more of waking hours)  4 - Bedbound (Completely disabled. Cannot carry on any self-care. Totally confined to bed or chair)  5 - Death   Eustace Pen MM, Creech RH, Tormey DC, et al. [phone]). "Toxicity and response criteria of the [facility]". [place] Oncol. 5 (6): 649-55  LABORATORY DATA:  Lab Results  Component Value Date   WBC 3.8 (L) 01/18/2019   HGB 8.5 (L) 01/18/2019   HCT 25.3 (L) 01/18/2019   MCV 92.7 01/18/2019   PLT 82 (L) 01/18/2019   NEUTROABS 3.0 01/18/2019   Lab Results  Component Value Date   NA 136 01/18/2019   K 4.2 01/18/2019   CL 103 01/18/2019   CO2 24 01/18/2019   GLUCOSE 84 01/18/2019   CREATININE 0.84 01/18/2019   CALCIUM 9.2 01/18/2019         IMPRESSION: Stage IIIb Clinical Squamous Cell Carcinoma of the Cervix Patient is now ready to proceed with intracavitary brachii therapy treatments to complete her definitive course of therapy.  Procedure explained to the patient she appears to understand well.   PLAN: Patient will be taken the operating room on June 18 for exam under anesthesia and placement of brachii therapy equipment in preparation for  high-dose-rate radiation therapy with iridium 192 as the high-dose-rate source.  Plan is for the patient to receive 5 high-dose-rate treatments directed at the cervical region.    ------------------------------------------------  Blair Promise, PhD, MD  This document serves as a record of services personally performed by Gery Pray, MD. It was created on his behalf by Wilburn Mylar, a trained medical scribe. The creation of this record is based on the scribe's personal observations and the provider's  statements to them. This document has been checked and approved by the attending provider.

## 2019-01-21 NOTE — Anesthesia Procedure Notes (Signed)
Procedure Name: LMA Insertion Date/Time: 01/21/2019 11:36 AM Performed by: Wanita Chamberlain, CRNA Pre-anesthesia Checklist: Patient identified, Emergency Drugs available, Suction available, Patient being monitored and Timeout performed Patient Re-evaluated:Patient Re-evaluated prior to induction Oxygen Delivery Method: Circle system utilized Preoxygenation: Pre-oxygenation with 100% oxygen Induction Type: IV induction Ventilation: Mask ventilation without difficulty LMA: LMA inserted LMA Size: 4.0 Number of attempts: 1 Placement Confirmation: positive ETCO2,  CO2 detector and breath sounds checked- equal and bilateral Tube secured with: Tape Dental Injury: Teeth and Oropharynx as per pre-operative assessment

## 2019-01-21 NOTE — Anesthesia Postprocedure Evaluation (Signed)
Anesthesia Post Note  Patient: Brayleigh Rybacki  Procedure(s) Performed: TANDEM RING INSERTION (N/A Vagina ) OPERATIVE ULTRASOUND (N/A )     Patient location during evaluation: PACU Anesthesia Type: General Level of consciousness: awake and alert Pain management: pain level controlled Vital Signs Assessment: post-procedure vital signs reviewed and stable Respiratory status: spontaneous breathing, nonlabored ventilation, respiratory function stable and patient connected to nasal cannula oxygen Cardiovascular status: blood pressure returned to baseline and stable Postop Assessment: no apparent nausea or vomiting Anesthetic complications: no    Last Vitals:  Vitals:   01/21/19 1235 01/21/19 1245  BP:  114/62  Pulse: 67 71  Resp: 13 11  Temp:    SpO2: 100% 100%    Last Pain:  Vitals:   01/21/19 1243  TempSrc:   PainSc: 0-No pain                 Olivier Frayre S

## 2019-01-21 NOTE — Progress Notes (Signed)
Patient back from OR and CT doing fair medicated with diluadid for cramping pain 6/10. Tolerated lunch well LR @150  ml/hr infusing with out difficulty in a left arm peripheral vein. Concerns and questions addressed.

## 2019-01-21 NOTE — Interval H&P Note (Signed)
History and Physical Interval Note:  01/21/2019 11:13 AM  Jennifer Santos  has presented today for surgery, with the diagnosis of EXOCERVIX.  The various methods of treatment have been discussed with the patient and family. After consideration of risks, benefits and other options for treatment, the patient has consented to  Procedure(s): TANDEM RING INSERTION (N/A) OPERATIVE ULTRASOUND (N/A) as a surgical intervention.  The patient's history has been reviewed, patient examined, no change in status, stable for surgery.  I have reviewed the patient's chart and labs.  Questions were answered to the patient's satisfaction.     Gery Pray

## 2019-01-21 NOTE — Discharge Instructions (Signed)

## 2019-01-21 NOTE — Patient Instructions (Signed)
IMMEDIATELY FOLLOWING SURGERY: Do not drive or operate machinery for the first twenty four hours after surgery. Do not make any important decisions for twenty four hours after surgery or while taking narcotic pain medications or sedatives. If you develop intractable nausea and vomiting or a severe headache please notify your doctor immediately.   FOLLOW-UP: You do not need to follow up with anesthesia unless specifically instructed to do so.   WOUND CARE INSTRUCTIONS (if applicable): Expect some mild vaginal bleeding, but if large amount of bleeding occurs please contact Dr. Sondra Come at [phone] or the Radiation On-Call physician. Call for any fever greater than 101.0 degrees or increasing vaginal//abdominal pain or trouble urinating.   QUESTIONS?: Please feel free to call your physician or the hospital operator if you have any questions, and they will be happy to assist you. Resume all medications: as listed on your after visit summary. Your next appointment is:  Future Appointments  Date Time Provider [place]  01/25/2019  7:00 AM [facility] 1 [facility]US [place]  01/25/2019  9:30 AM Gery Pray, MD [facility] None  01/25/2019 12:00 PM Gery Pray, MD [facility] None  02/02/2019  7:30 AM WL-US 2 WL-US [place]  02/02/2019  9:30 AM Gery Pray, MD [facility] None  02/02/2019 12:00 PM Gery Pray, MD [place] None  02/11/2019  9:30 AM WL-US 2 WL-US [place]  02/11/2019 11:30 AM Gery Pray, MD [place] None  02/11/2019  2:30 PM Gery Pray, MD [place] None  02/18/2019  9:30 AM WL-US 2 WL-US [place]  02/18/2019 11:30 AM Gery Pray, MD [facility] None  02/18/2019  2:30 PM Gery Pray, MD [facility] None  03/01/2019 10:00 AM [facility] LAB 3 [facility] None  03/01/2019 10:15 AM [facility] [place] FLUSH [facility] None  04/26/2019 10:00 AM [facility] LAB 3 [facility] None  04/26/2019 10:15 AM [facility] [place] FLUSH [facility] None

## 2019-01-21 NOTE — Anesthesia Preprocedure Evaluation (Addendum)
Anesthesia Evaluation  Patient identified by MRN, date of birth, ID band Patient awake    Reviewed: Allergy & Precautions, NPO status , Patient's Chart, lab work & pertinent test results  Airway Mallampati: II  TM Distance: >3 FB Neck ROM: Full    Dental no notable dental hx. (+) Teeth Intact, Dental Advisory Given   Pulmonary Current Smoker,    Pulmonary exam normal breath sounds clear to auscultation       Cardiovascular negative cardio ROS Normal cardiovascular exam Rhythm:Regular Rate:Normal     Neuro/Psych negative neurological ROS  negative psych ROS   GI/Hepatic negative GI ROS, Neg liver ROS,   Endo/Other  negative endocrine ROS  Renal/GU negative Renal ROS  negative genitourinary   Musculoskeletal negative musculoskeletal ROS (+)   Abdominal   Peds negative pediatric ROS (+)  Hematology  (+) Blood dyscrasia (pancytopenia from chemo), ,   Anesthesia Other Findings   Reproductive/Obstetrics negative OB ROS                            Anesthesia Physical Anesthesia Plan  ASA: II  Anesthesia Plan: General   Post-op Pain Management:    Induction: Intravenous  PONV Risk Score and Plan: 3 and Ondansetron, Dexamethasone, Midazolam and Treatment may vary due to age or medical condition  Airway Management Planned: LMA  Additional Equipment:   Intra-op Plan:   Post-operative Plan: Extubation in OR  Informed Consent: I have reviewed the patients History and Physical, chart, labs and discussed the procedure including the risks, benefits and alternatives for the proposed anesthesia with the patient or authorized representative who has indicated his/her understanding and acceptance.     Dental advisory given  Plan Discussed with: CRNA  Anesthesia Plan Comments:         Anesthesia Quick Evaluation

## 2019-01-21 NOTE — Progress Notes (Signed)
  Radiation Oncology         [phone]) [phone] ________________________________  Name: Jennifer Santos MRN: [id number]  Date: 01/21/2019  DOB: 09/12/1965  SIMULATION AND TREATMENT PLANNING NOTE HDR BRACHYTHERAPY  DIAGNOSIS:  Stage IIIb Clinical Squamous Cell Carcinoma of the Cervix  NARRATIVE:  The patient was brought to the [place] suite.  Identity was confirmed.  All relevant records and images related to the planned course of therapy were reviewed.  The patient freely provided informed written consent to proceed with treatment after reviewing the details related to the planned course of therapy. The consent form was witnessed and verified by the simulation staff.  Then, the patient was set-up in a stable reproducible  supine position for radiation therapy.  CT images were obtained.  Surface markings were placed.  The CT images were loaded into the planning software.  Then the target and avoidance structures were contoured.  Treatment planning then occurred.  The radiation prescription was entered and confirmed.   I have requested : Brachytherapy Isodose Plan and Dosimetry Calculations to plan the radiation distribution.    PLAN:  The patient will receive 5.5 Gy in 1 fraction.  Iridium 192 will be the high-dose-rate source.  Patient will be treated with the tandem ring apparatus.    ________________________________  Blair Promise, PhD, MD  This document serves as a record of services personally performed by Gery Pray, MD. It was created on his behalf by Mary-Margaret Loma Messing, a trained medical scribe. The creation of this record is based on the scribe's personal observations and the provider's statements to them. This document has been checked and approved by the attending provider.

## 2019-01-21 NOTE — Op Note (Signed)
01/21/2019  12:37 PM  PATIENT:  Jennifer Santos  53 y.o. female  PRE-OPERATIVE DIAGNOSIS:  EXOCERVIX  POST-OPERATIVE DIAGNOSIS:  EXOCERVIX  PROCEDURE:  Procedure(s): TANDEM RING INSERTION (N/A) OPERATIVE ULTRASOUND (N/A)  SURGEON:  Surgeon(s) and Role:    * Gery Pray, MD - Primary  PHYSICIAN ASSISTANT:   ASSISTANTS: none   ANESTHESIA:   general  EBL:  2 mL   BLOOD ADMINISTERED:none  DRAINS: Urinary Catheter (Foley)   LOCAL MEDICATIONS USED:  NONE  SPECIMEN:  No Specimen  DISPOSITION OF SPECIMEN:  N/A  COUNTS:  YES  TOURNIQUET:  * No tourniquets in log *  DICTATION: Patient was taken to the outpatient OR #1.  Patient was prepped and draped in usual sterile fashion and placed in the dorsolithotomy position.  Timeout was performed for preoperative medications procedure well as patient identification.  The patient proceeded to undergo placement of a Foley catheter without difficulty.  The bladder was backfilled with approximately 200 cc sterile water.  Patient proceeded to undergo exam under anesthesia the cervical fornices are absent due to extensive tumor.  Bimanual and rectovaginal examination patient continues to have significant firm tumor extending into the right uterosacral ligament towards the right pelvic sidewall.  The left parametrial area was Free of tumor .  The size of the cervical lesion was difficult to determine given the significantly altered anatomy.  Patient then proceeded to undergo sounding of the uterus with ultrasound guidance.  The patient initially had poor ultrasound image quality but then with manipulation the images improved.  The uterus noted to be minimally anteverted and sounded to approximately 6.5 cm.  Patient was noted to have a significant bowel gas in the pelvis region which somewhat obscured ultrasound images initially.  Patient underwent serial dilation off the cervical os and then had placement of a 40 mm cervical sleeve within the  endocervical canal and endometrial canal.  This was then followed by placement of a 40 mm 60 degree tandem within the endocervical and endometrial canal.  Images showed good placement centrally within the endometrial canal.  Patient then had placement of a ring with a small shielding cap In place.  This was then followed by placement of a rectal paddle.  There is no room for vaginal packing present.  Ultrasound images at the completion of the procedure showed good placement of the tandem within the endocervical canal and endometrial cavity.  Patient tolerated the procedure well.  She was subsequently transferred to the recovery room in stable condition.  Later in the day the patient will be taken down to the radiation oncology for planning and CT scan imaging followed by her first high-dose-rate brachii therapy treatment.  The patient will be treated with iridium 192 is the high-dose-rate source.  She is planning  to have 5 high-dose-rate procedures.  PLAN OF CARE: Transferred to radiation oncology for planning and treatment  PATIENT DISPOSITION:  PACU - hemodynamically stable.   Delay start of Pharmacological VTE agent (>24hrs) due to surgical blood loss or risk of bleeding: not applicable

## 2019-01-21 NOTE — Progress Notes (Signed)
  Radiation Oncology         ([phone]) [phone] ________________________________  Name: Jennifer Santos MRN: [id number]  Date: 01/21/2019  DOB: Sep 29, 1965  CC: Marguerita Merles, MD  Nancy Marus, MD  HDR BRACHYTHERAPY NOTE  DIAGNOSIS: Stage IIIC-2SquamousCellCarcinoma of theCervix  NARRATIVE: The patient was brought to the [place] suite. Identity was confirmed. All relevant records and images related to the planned course of therapy were reviewed. The patient freely provided informed written consent to proceed with treatment after reviewing the details related to the planned course of therapy. The consent form was witnessed and verified by the simulation staff. Then, the patient was set-up in a stable reproducible supine position for radiation therapy. The tandem ring system was accessed and fiducial markers were placed within the tandem and ring.   Simple treatment device note: In the operating room the patient had construction of her custom tandem ring system. She will be treated with a 60 tandem/ring system. The patient had placement of a 40 mm tandem. A cervical ring with a small shielding was used for her treatment. A rectal paddle was also part of her custom set up device.  Verification simulation note: An AP and lateral film was obtained through the pelvis area. This was compared to the patient's planning films documenting accurate position of the tandem/ring system for treatment.  High-dose-rate brachytherapy treatment note:  The remote afterloading device was accessed through catheter system and attached to the tandem ring system. Patient then proceeded to undergo her first high-dose-rate treatment directed at the cervix. The patient was prescribed a dose of 5.5 gray to be delivered to the [place] Kitchen Patient was treated with 2 channels using 20 dwell positions. Treatment time was 437.8 seconds. The patient tolerated the procedure well. After completion of her therapy, a radiation survey was  performed documenting return of the iridium source into the GammaMed safe. The patient was then transferred to the nursing suite. She then had removal of the rectal paddle followed by the tandem and ring system. The patient tolerated the removal well.  PLAN: She will return 01/25/19 for her second high dose rate treatment.  ________________________________  Blair Promise, PhD, MD This document serves as a record of services personally performed by Gery Pray, MD. It was created on his behalf by Mary-Margaret Loma Messing, a trained medical scribe. The creation of this record is based on the scribe's personal observations and the provider's statements to them. This document has been checked and approved by the attending provider.

## 2019-01-22 ENCOUNTER — Other Ambulatory Visit: Payer: Self-pay | Admitting: Hematology and Oncology

## 2019-01-22 ENCOUNTER — Encounter (HOSPITAL_BASED_OUTPATIENT_CLINIC_OR_DEPARTMENT_OTHER): Payer: Self-pay | Admitting: Radiation Oncology

## 2019-01-25 ENCOUNTER — Encounter (HOSPITAL_BASED_OUTPATIENT_CLINIC_OR_DEPARTMENT_OTHER)
Admission: RE | Disposition: A | Discharge status: Other Acute Inpatient Hospital | Payer: Self-pay | Source: Home / Self Care | Attending: Radiation Oncology

## 2019-01-25 ENCOUNTER — Ambulatory Visit
Admission: RE | Admit: 2019-01-25 | Discharge: 2019-01-25 | Disposition: A | Discharge status: Home / Self Care | Payer: BLUE CROSS/BLUE SHIELD | Source: Ambulatory Visit | Attending: Radiation Oncology | Admitting: Radiation Oncology

## 2019-01-25 ENCOUNTER — Encounter (HOSPITAL_BASED_OUTPATIENT_CLINIC_OR_DEPARTMENT_OTHER): Payer: Self-pay

## 2019-01-25 ENCOUNTER — Ambulatory Visit (HOSPITAL_BASED_OUTPATIENT_CLINIC_OR_DEPARTMENT_OTHER): Payer: BLUE CROSS/BLUE SHIELD | Admitting: Anesthesiology

## 2019-01-25 ENCOUNTER — Ambulatory Visit (HOSPITAL_BASED_OUTPATIENT_CLINIC_OR_DEPARTMENT_OTHER)
Admission: RE | Admit: 2019-01-25 | Discharge: 2019-01-25 | Disposition: A | Discharge status: Other Acute Inpatient Hospital | Payer: BLUE CROSS/BLUE SHIELD | Attending: Radiation Oncology | Admitting: Radiation Oncology

## 2019-01-25 ENCOUNTER — Ambulatory Visit (HOSPITAL_COMMUNITY)
Admission: RE | Admit: 2019-01-25 | Discharge: 2019-01-25 | Disposition: A | Discharge status: Home / Self Care | Payer: BLUE CROSS/BLUE SHIELD | Source: Ambulatory Visit | Attending: Radiation Oncology | Admitting: Radiation Oncology

## 2019-01-25 ENCOUNTER — Other Ambulatory Visit: Payer: Self-pay

## 2019-01-25 VITALS — BP 113/62 | HR 73 | Resp 18

## 2019-01-25 DIAGNOSIS — F17210 Nicotine dependence, cigarettes, uncomplicated: Secondary | ICD-10-CM | POA: Diagnosis not present

## 2019-01-25 DIAGNOSIS — C531 Malignant neoplasm of exocervix: Secondary | ICD-10-CM

## 2019-01-25 DIAGNOSIS — C539 Malignant neoplasm of cervix uteri, unspecified: Secondary | ICD-10-CM

## 2019-01-25 DIAGNOSIS — Z9221 Personal history of antineoplastic chemotherapy: Secondary | ICD-10-CM | POA: Insufficient documentation

## 2019-01-25 DIAGNOSIS — Z923 Personal history of irradiation: Secondary | ICD-10-CM | POA: Insufficient documentation

## 2019-01-25 DIAGNOSIS — D61818 Other pancytopenia: Secondary | ICD-10-CM | POA: Diagnosis not present

## 2019-01-25 HISTORY — PX: OPERATIVE ULTRASOUND: SHX5996

## 2019-01-25 HISTORY — PX: TANDEM RING INSERTION: SHX6199

## 2019-01-25 SURGERY — INSERTION, UTERINE TANDEM AND RING OR CYLINDER, FOR BRACHYTHERAPY
Anesthesia: General

## 2019-01-25 MED ORDER — ONDANSETRON HCL 4 MG/2ML IJ SOLN
INTRAMUSCULAR | Status: DC | PRN
  Administered 2019-01-25: 4 mg via INTRAVENOUS

## 2019-01-25 MED ORDER — LIDOCAINE 2% (20 MG/ML) 5 ML SYRINGE
INTRAMUSCULAR | Status: AC
  Filled 2019-01-25: qty 5

## 2019-01-25 MED ORDER — ACETAMINOPHEN 10 MG/ML IV SOLN
INTRAVENOUS | Status: AC
  Filled 2019-01-25: qty 100

## 2019-01-25 MED ORDER — DEXAMETHASONE SODIUM PHOSPHATE 10 MG/ML IJ SOLN
INTRAMUSCULAR | Status: AC
  Filled 2019-01-25: qty 1

## 2019-01-25 MED ORDER — FENTANYL CITRATE (PF) 100 MCG/2ML IJ SOLN
INTRAMUSCULAR | Status: AC
  Filled 2019-01-25: qty 2

## 2019-01-25 MED ORDER — MIDAZOLAM HCL 2 MG/2ML IJ SOLN
INTRAMUSCULAR | Status: DC | PRN
  Administered 2019-01-25: 1 mg via INTRAVENOUS

## 2019-01-25 MED ORDER — HYDROMORPHONE HCL 1 MG/ML IJ SOLN
0.5000 mg | Freq: Once | INTRAMUSCULAR | Status: DC
  Filled 2019-01-25: qty 1

## 2019-01-25 MED ORDER — SODIUM CHLORIDE 0.9 % IR SOLN
Status: DC | PRN
  Administered 2019-01-25: 1000 mL via INTRAVESICAL

## 2019-01-25 MED ORDER — DEXAMETHASONE SODIUM PHOSPHATE 10 MG/ML IJ SOLN
INTRAMUSCULAR | Status: DC | PRN
  Administered 2019-01-25: 5 mg via INTRAVENOUS

## 2019-01-25 MED ORDER — LACTATED RINGERS IV SOLN
INTRAVENOUS | Status: DC
  Administered 2019-01-25: 125 mL/h via INTRAVENOUS
  Administered 2019-01-25: 09:00:00 via INTRAVENOUS
  Filled 2019-01-25: qty 1000

## 2019-01-25 MED ORDER — PROPOFOL 10 MG/ML IV BOLUS
INTRAVENOUS | Status: AC
  Filled 2019-01-25: qty 20

## 2019-01-25 MED ORDER — MIDAZOLAM HCL 2 MG/2ML IJ SOLN
INTRAMUSCULAR | Status: AC
  Filled 2019-01-25: qty 2

## 2019-01-25 MED ORDER — ONDANSETRON HCL 4 MG/2ML IJ SOLN
INTRAMUSCULAR | Status: AC
  Filled 2019-01-25: qty 2

## 2019-01-25 MED ORDER — LIDOCAINE HCL (CARDIAC) PF 100 MG/5ML IV SOSY
PREFILLED_SYRINGE | INTRAVENOUS | Status: DC | PRN
  Administered 2019-01-25: 50 mg via INTRAVENOUS

## 2019-01-25 MED ORDER — LACTATED RINGERS IV SOLN
INTRAVENOUS | Status: DC
  Administered 2019-01-25: 10:00:00 via INTRAVENOUS
  Filled 2019-01-25 (×2): qty 250

## 2019-01-25 MED ORDER — KETOROLAC TROMETHAMINE 30 MG/ML IJ SOLN
INTRAMUSCULAR | Status: DC | PRN
  Administered 2019-01-25: 30 mg via INTRAVENOUS

## 2019-01-25 MED ORDER — SCOPOLAMINE 1 MG/3DAYS TD PT72
MEDICATED_PATCH | TRANSDERMAL | Status: DC | PRN
  Administered 2019-01-25: 1 via TRANSDERMAL

## 2019-01-25 MED ORDER — OXYCODONE HCL 5 MG PO TABS
5.0000 mg | ORAL_TABLET | Freq: Once | ORAL | Status: DC | PRN
  Filled 2019-01-25: qty 1

## 2019-01-25 MED ORDER — FENTANYL CITRATE (PF) 100 MCG/2ML IJ SOLN
25.0000 ug | INTRAMUSCULAR | Status: DC | PRN
  Filled 2019-01-25: qty 1

## 2019-01-25 MED ORDER — ACETAMINOPHEN 10 MG/ML IV SOLN
INTRAVENOUS | Status: DC | PRN
  Administered 2019-01-25: 1000 mg via INTRAVENOUS

## 2019-01-25 MED ORDER — FENTANYL CITRATE (PF) 100 MCG/2ML IJ SOLN
INTRAMUSCULAR | Status: DC | PRN
  Administered 2019-01-25 (×2): 25 ug via INTRAVENOUS

## 2019-01-25 MED ORDER — SCOPOLAMINE 1 MG/3DAYS TD PT72
MEDICATED_PATCH | TRANSDERMAL | Status: AC
  Filled 2019-01-25: qty 1

## 2019-01-25 MED ORDER — OXYCODONE HCL 5 MG/5ML PO SOLN
5.0000 mg | Freq: Once | ORAL | Status: DC | PRN
  Filled 2019-01-25: qty 5

## 2019-01-25 MED ORDER — PHENYLEPHRINE 40 MCG/ML (10ML) SYRINGE FOR IV PUSH (FOR BLOOD PRESSURE SUPPORT)
PREFILLED_SYRINGE | INTRAVENOUS | Status: AC
  Filled 2019-01-25: qty 10

## 2019-01-25 MED ORDER — HYDROMORPHONE HCL 1 MG/ML IJ SOLN
INTRAMUSCULAR | Status: AC
  Filled 2019-01-25: qty 1

## 2019-01-25 MED ORDER — KETOROLAC TROMETHAMINE 30 MG/ML IJ SOLN
INTRAMUSCULAR | Status: AC
  Filled 2019-01-25: qty 1

## 2019-01-25 MED ORDER — HYDROMORPHONE HCL 1 MG/ML IJ SOLN
0.5000 mg | Freq: Once | INTRAMUSCULAR | Status: AC
  Administered 2019-01-25: 0.5 mg via INTRAVENOUS
  Filled 2019-01-25: qty 1

## 2019-01-25 MED ORDER — PHENYLEPHRINE 40 MCG/ML (10ML) SYRINGE FOR IV PUSH (FOR BLOOD PRESSURE SUPPORT)
PREFILLED_SYRINGE | INTRAVENOUS | Status: DC | PRN
  Administered 2019-01-25: 60 ug via INTRAVENOUS
  Administered 2019-01-25: 40 ug via INTRAVENOUS
  Administered 2019-01-25: 60 ug via INTRAVENOUS
  Administered 2019-01-25: 40 ug via INTRAVENOUS

## 2019-01-25 MED ORDER — ONDANSETRON HCL 4 MG/2ML IJ SOLN
4.0000 mg | Freq: Once | INTRAMUSCULAR | Status: DC | PRN
  Filled 2019-01-25: qty 2

## 2019-01-25 MED ORDER — PROPOFOL 10 MG/ML IV BOLUS
INTRAVENOUS | Status: DC | PRN
  Administered 2019-01-25: 100 mg via INTRAVENOUS

## 2019-01-25 SURGICAL SUPPLY — 19 items
BNDG CONFORM 2 STRL LF (GAUZE/BANDAGES/DRESSINGS) IMPLANT
COVER WAND RF STERILE (DRAPES) ×3 IMPLANT
DILATOR CANAL MILEX (MISCELLANEOUS) IMPLANT
GAUZE 4X4 16PLY RFD (DISPOSABLE) ×3 IMPLANT
GLOVE BIO SURGEON STRL SZ7.5 (GLOVE) ×6 IMPLANT
GOWN STRL REUS W/TWL LRG LVL3 (GOWN DISPOSABLE) ×3 IMPLANT
HOLDER FOLEY CATH W/STRAP (MISCELLANEOUS) ×3 IMPLANT
HOVERMATT SINGLE USE (MISCELLANEOUS) ×3 IMPLANT
IV NS 1000ML (IV SOLUTION) ×2
IV NS 1000ML BAXH (IV SOLUTION) ×1 IMPLANT
IV SET EXTENSION GRAVITY 40 LF (IV SETS) ×3 IMPLANT
KIT TURNOVER CYSTO (KITS) ×3 IMPLANT
PACK VAGINAL MINOR WOMEN LF (CUSTOM PROCEDURE TRAY) ×3 IMPLANT
PACKING VAGINAL (PACKING) IMPLANT
PAD ABD 8X10 STRL (GAUZE/BANDAGES/DRESSINGS) ×3 IMPLANT
PAD OB MATERNITY 4.3X12.25 (PERSONAL CARE ITEMS) IMPLANT
TOWEL OR 17X26 10 PK STRL BLUE (TOWEL DISPOSABLE) ×3 IMPLANT
TRAY FOLEY W/BAG SLVR 14FR (SET/KITS/TRAYS/PACK) ×3 IMPLANT
WATER STERILE IRR 500ML POUR (IV SOLUTION) ×3 IMPLANT

## 2019-01-25 NOTE — Progress Notes (Signed)
Patient returned from CT sim. Doing well denies any issues or complaints of denies any need for pain medication at this time

## 2019-01-25 NOTE — Op Note (Signed)
01/25/2019  8:45 AM  PATIENT:  Jennifer Santos  53 y.o. female  PRE-OPERATIVE DIAGNOSIS:  EXOCERVIX  POST-OPERATIVE DIAGNOSIS:  EXOCERVIX  PROCEDURE:  Procedure(s): TANDEM RING INSERTION (N/A) OPERATIVE ULTRASOUND (N/A)  SURGEON:  Surgeon(s) and Role:    * Gery Pray, MD - Primary  PHYSICIAN ASSISTANT:   ASSISTANTS: none   ANESTHESIA:   general  EBL:  0 cc  BLOOD ADMINISTERED:none  DRAINS: Urinary Catheter (Foley)   LOCAL MEDICATIONS USED:  NONE  SPECIMEN:  No Specimen  DISPOSITION OF SPECIMEN:  N/A  COUNTS:  YES  TOURNIQUET:  * No tourniquets in log *  DICTATION: Patient was taken to the outpatient OR #3.  Patient was prepped and draped in usual sterile fashion and placed in the dorsolithotomy position.  Timeout was performed for preoperative medications procedure well as patient identification.  The patient proceeded to undergo placement of a Foley catheter without difficulty.  The bladder was backfilled with approximately 200 cc sterile water.  Patient proceeded to undergo exam under anesthesia the cervical fornices are absent due to prior extensive tumor.  Bimanual and rectovaginal examination patient continues to have significant firm tumor extending into the right uterosacral ligament towards the right pelvic sidewall.  The left parametrial area was Free of tumor .  The size of the cervical lesion was difficult to determine given the significantly altered anatomy.  Patient then proceeded to undergo sounding of the uterus with ultrasound guidance.  The patient initially had poor ultrasound image quality but then with manipulation the images improved.  The uterus noted to be minimally anteverted and sounded to approximately 6.5 cm.  Patient was noted to have a significant bowel gas in the pelvis region which somewhat obscured ultrasound images initially.  40 mm cervical sleeve remains in place from procedure last week.  I switched this to a 60 mm but the sleeve protruded  into the vaginal vault therefore  a 40 mm sleeve was then placed into the endocervical canal and endometrial canal.  This was then followed by placement of a 40 mm 60 degree tandem within the endocervical and endometrial canal.  Images showed good placement centrally within the endometrial canal.  Patient then had placement of a ring with a small shielding cap In place.  This was then followed by placement of a rectal paddle.  There is no room for vaginal packing present.  Ultrasound images at the completion of the procedure showed good placement of the tandem within the endocervical canal and endometrial cavity.  Patient tolerated the procedure well.  She was subsequently transferred to the recovery room in stable condition.  Later in the day the patient will be taken down to the radiation oncology for planning and CT scan imaging followed by her second high-dose-rate brachii therapy treatment.  The patient will be treated with iridium 192 is the high-dose-rate source.  She is planning  to have 5 high-dose-rate procedures.  PLAN OF CARE: Transferred to radiation oncology for planning and treatment  PATIENT DISPOSITION:  PACU - hemodynamically stable.   Delay start of Pharmacological VTE agent (>24hrs) due to surgical blood loss or risk of bleeding: not applicable

## 2019-01-25 NOTE — Anesthesia Procedure Notes (Signed)
Procedure Name: LMA Insertion Date/Time: 01/25/2019 7:44 AM Performed by: Raenette Rover, CRNA Pre-anesthesia Checklist: Patient identified, Emergency Drugs available, Suction available and Patient being monitored Patient Re-evaluated:Patient Re-evaluated prior to induction Oxygen Delivery Method: Circle system utilized Preoxygenation: Pre-oxygenation with 100% oxygen Induction Type: IV induction LMA: LMA inserted LMA Size: 4.0 Number of attempts: 1 Tube secured with: Tape Dental Injury: Teeth and Oropharynx as per pre-operative assessment

## 2019-01-25 NOTE — Anesthesia Postprocedure Evaluation (Signed)
Anesthesia Post Note  Patient: Jennifer Santos  Procedure(s) Performed: TANDEM RING INSERTION (N/A ) OPERATIVE ULTRASOUND (N/A )     Patient location during evaluation: PACU Anesthesia Type: General Level of consciousness: awake and alert Pain management: pain level controlled Vital Signs Assessment: post-procedure vital signs reviewed and stable Respiratory status: spontaneous breathing, nonlabored ventilation, respiratory function stable and patient connected to nasal cannula oxygen Cardiovascular status: blood pressure returned to baseline and stable Postop Assessment: no apparent nausea or vomiting Anesthetic complications: no    Last Vitals:  Vitals:   01/25/19 0830 01/25/19 0845  BP: 107/74 106/72  Pulse: 78 78  Resp: 17 15  Temp:    SpO2: 100% 100%    Last Pain:  Vitals:   01/25/19 0830  TempSrc:   PainSc: 0-No pain                 [name]

## 2019-01-25 NOTE — Interval H&P Note (Signed)
History and Physical Interval Note:  01/25/2019 7:32 AM  Gardner Candle  has presented today for surgery, with the diagnosis of EXOCERVIX.  The various methods of treatment have been discussed with the patient and family. After consideration of risks, benefits and other options for treatment, the patient has consented to  Procedure(s): TANDEM RING INSERTION (N/A) OPERATIVE ULTRASOUND (N/A) as a surgical intervention.  The patient's history has been reviewed, patient examined, no change in status, stable for surgery.  I have reviewed the patient's chart and labs.  Questions were answered to the patient's satisfaction.     Gery Pray

## 2019-01-25 NOTE — Addendum Note (Signed)
Encounter addended by: Loma Sousa, RN on: 01/25/2019 12:17 PM  Actions taken: Order list changed, Diagnosis association updated

## 2019-01-25 NOTE — Transfer of Care (Signed)
Immediate Anesthesia Transfer of Care Note  Patient: Jennifer Santos  Procedure(s) Performed: TANDEM RING INSERTION (N/A ) OPERATIVE ULTRASOUND (N/A )  Patient Location: PACU  Anesthesia Type:General  Level of Consciousness: awake, alert , oriented and patient cooperative  Airway & Oxygen Therapy: Patient Spontanous Breathing and Patient connected to nasal cannula oxygen  Post-op Assessment: Report given to RN and Post -op Vital signs reviewed and stable  Post vital signs: Reviewed and stable  Last Vitals:  Vitals Value Taken Time  BP 66/57 01/25/19 0815  Temp 36.6 C 01/25/19 0815  Pulse 79 01/25/19 0817  Resp 14 01/25/19 0817  SpO2 100 % 01/25/19 0817  Vitals shown include unvalidated device data.  Last Pain:  Vitals:   01/25/19 0712  TempSrc: Oral  PainSc: 0-No pain      Patients Stated Pain Goal: 7 (15/04/13 6438)  Complications: No apparent anesthesia complications

## 2019-01-25 NOTE — Anesthesia Preprocedure Evaluation (Signed)
Anesthesia Evaluation  Patient identified by MRN, date of birth, ID band Patient awake    Reviewed: Allergy & Precautions, NPO status , Patient's Chart, lab work & pertinent test results  Airway Mallampati: II  TM Distance: >3 FB Neck ROM: Full    Dental  (+) Teeth Intact, Dental Advisory Given   Pulmonary Current Smoker,    breath sounds clear to auscultation + decreased breath sounds      Cardiovascular  Rhythm:Regular Rate:Normal     Neuro/Psych    GI/Hepatic   Endo/Other    Renal/GU      Musculoskeletal   Abdominal   Peds  Hematology   Anesthesia Other Findings   Reproductive/Obstetrics                             Anesthesia Physical Anesthesia Plan  ASA: III  Anesthesia Plan: General   Post-op Pain Management:    Induction: Intravenous  PONV Risk Score and Plan: Ondansetron, Dexamethasone and Scopolamine patch - Pre-op  Airway Management Planned: LMA  Additional Equipment:   Intra-op Plan:   Post-operative Plan:   Informed Consent: I have reviewed the patients History and Physical, chart, labs and discussed the procedure including the risks, benefits and alternatives for the proposed anesthesia with the patient or authorized representative who has indicated his/her understanding and acceptance.     Dental advisory given  Plan Discussed with: CRNA and Anesthesiologist  Anesthesia Plan Comments: (Cervical Ca Smoker Pancytopenia secondary to Chemo Rx and XRT T12 compression Fx H/O Nausea/Vomiting with chemo and XRT  Plan GA with LMA  Roberts Gaudy)        Anesthesia Quick Evaluation

## 2019-01-25 NOTE — Progress Notes (Signed)
Patient tolerated treatment well. She was given pain medication and had TDR removed with the sleeve. Discharged to home without any complaints of.

## 2019-01-25 NOTE — Progress Notes (Signed)
  Radiation Oncology         [phone]) [phone] ________________________________  Name: Jennifer Santos MRN: [id number]  Date: 01/25/2019  DOB: [birth year]  SIMULATION AND TREATMENT PLANNING NOTE HDR BRACHYTHERAPY  DIAGNOSIS:  Stage IIIC-2SquamousCellCarcinoma of theCervix  NARRATIVE:  The patient was brought to the [place].  Identity was confirmed.  All relevant records and images related to the planned course of therapy were reviewed.  The patient freely provided informed written consent to proceed with treatment after reviewing the details related to the planned course of therapy. The consent form was witnessed and verified by the simulation staff.  Then, the patient was set-up in a stable reproducible  supine position for radiation therapy.  CT images were obtained.  Surface markings were placed.  The CT images were loaded into the planning software.  Then the target and avoidance structures were contoured.  Treatment planning then occurred.  The radiation prescription was entered and confirmed.   I have requested : Brachytherapy Isodose Plan and Dosimetry Calculations to plan the radiation distribution.    PLAN:  The patient will receive 5.5 Gy in 1 fraction directed at the high risk clinical target volume.  Patient will be treated with the tandem ring system using iridium 192 as the high-dose-rate source.    ________________________________ -----------------------------------  Blair Promise, PhD, MD    This document serves as a record of services personally performed by Gery Pray, MD. It was created on his behalf by Mary-Margaret Loma Messing, a trained medical scribe. The creation of this record is based on the scribe's personal observations and the provider's statements to them. This document has been checked and approved by the attending provider.

## 2019-01-25 NOTE — Addendum Note (Signed)
Encounter addended by: Ross Marcus, RN on: 01/25/2019 1:23 PM  Actions taken: Clinical Note Signed

## 2019-01-25 NOTE — Progress Notes (Signed)
  Radiation Oncology         [phone] ________________________________  Name: Jennifer Santos MRN: [id number]  Date: 01/25/2019  DOB: 08-29-1965  CC: Marguerita Merles, MD  Nancy Marus, MD  HDR BRACHYTHERAPY NOTE  DIAGNOSIS: Stage IIIC-2SquamousCellCarcinoma of theCervix  NARRATIVE: The patient was brought to the [place] suite. Identity was confirmed. All relevant records and images related to the planned course of therapy were reviewed. The patient freely provided informed written consent to proceed with treatment after reviewing the details related to the planned course of therapy. The consent form was witnessed and verified by the simulation staff. Then, the patient was set-up in a stable reproducible supine position for radiation therapy. The tandem ring system was accessed and fiducial markers were placed within the tandem and ring.   Simple treatment device note: In the operating room the patient had construction of her custom tandem ring system. She will be treated with a 60 tandem/ring system. The patient had placement of a 40 mm tandem. A cervical ring with a small shielding was used for her treatment. A rectal paddle was also part of her custom set up device.  Verification simulation note: An AP and lateral film was obtained through the pelvis area. This was compared to the patient's planning films documenting accurate position of the tandem/ring system for treatment.  High-dose-rate brachytherapy treatment note:  The remote afterloading device was accessed through catheter system and attached to the tandem ring system. Patient then proceeded to undergo her second high-dose-rate treatment directed at the cervix. The patient was prescribed a dose of 5.5 gray to be delivered to the [place]. Patient was treated with 2 channels using 20 dwell positions. Treatment time was 505.0 seconds. The patient tolerated the procedure well. After completion of her therapy, a radiation survey was  performed documenting return of the iridium source into the Gamma[employer] safe. The patient was then transferred to the nursing suite. She then had removal of the rectal paddle followed by the tandem and ring system. The patient tolerated the removal well.  PLAN: She will return 02/02/19 for her third high-dose-rate treatment.  ________________________________  Blair Promise, PhD, MD This document serves as a record of services personally performed by Gery Pray, MD. It was created on his behalf by Mary-Margaret Loma Messing, a trained medical scribe. The creation of this record is based on the scribe's personal observations and the provider's statements to them. This document has been checked and approved by the attending provider.

## 2019-01-25 NOTE — Addendum Note (Signed)
Encounter addended by: Loma Sousa, RN on: 01/25/2019 12:32 PM  Actions taken: MAR administration accepted

## 2019-01-26 ENCOUNTER — Encounter (HOSPITAL_BASED_OUTPATIENT_CLINIC_OR_DEPARTMENT_OTHER): Payer: Self-pay | Admitting: Radiation Oncology

## 2019-01-27 ENCOUNTER — Encounter: Payer: Self-pay | Admitting: Radiation Oncology

## 2019-01-29 ENCOUNTER — Other Ambulatory Visit: Payer: Self-pay

## 2019-01-29 ENCOUNTER — Encounter (HOSPITAL_BASED_OUTPATIENT_CLINIC_OR_DEPARTMENT_OTHER): Payer: Self-pay | Admitting: *Deleted

## 2019-01-29 NOTE — Progress Notes (Addendum)
Spoke w/ pt via phone for pre-op interview.  Npo after mn.   Arrive at 0530.  Needs CBCdiff.  Pt had negative covid test done 01-18-2019,  and pt verified that she has continued self quarantine with no symptoms.  No changes in health history per pt.

## 2019-02-01 ENCOUNTER — Encounter (HOSPITAL_BASED_OUTPATIENT_CLINIC_OR_DEPARTMENT_OTHER): Payer: Self-pay | Admitting: Anesthesiology

## 2019-02-01 NOTE — Anesthesia Preprocedure Evaluation (Addendum)
Anesthesia Evaluation  Patient identified by MRN, date of birth, ID band Patient awake    Reviewed: Allergy & Precautions, NPO status , Patient's Chart, lab work & pertinent test results  Airway Mallampati: II  TM Distance: >3 FB Neck ROM: Full    Dental no notable dental hx. (+) Teeth Intact   Pulmonary Current Smoker,    Pulmonary exam normal breath sounds clear to auscultation       Cardiovascular negative cardio ROS Normal cardiovascular exam Rhythm:Regular Rate:Normal     Neuro/Psych negative neurological ROS  negative psych ROS   GI/Hepatic Neg liver ROS, Diarrhea ? Radiation proctitis/enteritis   Endo/Other  negative endocrine ROS  Renal/GU Renal diseaseRight hydronephrosis with normal renal function  negative genitourinary   Musculoskeletal   Abdominal   Peds  Hematology  (+) anemia , Pancytopenia due to ChemoRx   Anesthesia Other Findings   Reproductive/Obstetrics Cervical Ca                            Anesthesia Physical Anesthesia Plan  ASA: II  Anesthesia Plan: General   Post-op Pain Management:    Induction: Intravenous  PONV Risk Score and Plan: 3 and Midazolam, Ondansetron, Treatment may vary due to age or medical condition and Dexamethasone  Airway Management Planned: LMA  Additional Equipment:   Intra-op Plan:   Post-operative Plan: Extubation in OR  Informed Consent: I have reviewed the patients History and Physical, chart, labs and discussed the procedure including the risks, benefits and alternatives for the proposed anesthesia with the patient or authorized representative who has indicated his/her understanding and acceptance.     Dental advisory given  Plan Discussed with: CRNA and Surgeon  Anesthesia Plan Comments:        Anesthesia Quick Evaluation

## 2019-02-02 ENCOUNTER — Encounter (HOSPITAL_BASED_OUTPATIENT_CLINIC_OR_DEPARTMENT_OTHER): Payer: Self-pay | Admitting: *Deleted

## 2019-02-02 ENCOUNTER — Encounter (HOSPITAL_BASED_OUTPATIENT_CLINIC_OR_DEPARTMENT_OTHER)
Admission: RE | Disposition: A | Discharge status: Other Acute Inpatient Hospital | Payer: Self-pay | Source: Home / Self Care | Attending: Radiation Oncology

## 2019-02-02 ENCOUNTER — Ambulatory Visit (HOSPITAL_COMMUNITY)
Admission: RE | Admit: 2019-02-02 | Discharge: 2019-02-02 | Disposition: A | Discharge status: Home / Self Care | Payer: BLUE CROSS/BLUE SHIELD | Source: Ambulatory Visit | Attending: Radiation Oncology | Admitting: Radiation Oncology

## 2019-02-02 ENCOUNTER — Ambulatory Visit
Admission: RE | Admit: 2019-02-02 | Discharge: 2019-02-02 | Disposition: A | Discharge status: Home / Self Care | Payer: BLUE CROSS/BLUE SHIELD | Source: Ambulatory Visit | Attending: Radiation Oncology | Admitting: Radiation Oncology

## 2019-02-02 ENCOUNTER — Ambulatory Visit (HOSPITAL_BASED_OUTPATIENT_CLINIC_OR_DEPARTMENT_OTHER)
Admission: RE | Admit: 2019-02-02 | Discharge: 2019-02-02 | Disposition: A | Discharge status: Other Acute Inpatient Hospital | Payer: BLUE CROSS/BLUE SHIELD | Attending: Radiation Oncology | Admitting: Radiation Oncology

## 2019-02-02 ENCOUNTER — Ambulatory Visit (HOSPITAL_BASED_OUTPATIENT_CLINIC_OR_DEPARTMENT_OTHER): Payer: BLUE CROSS/BLUE SHIELD | Admitting: Anesthesiology

## 2019-02-02 ENCOUNTER — Other Ambulatory Visit: Payer: Self-pay

## 2019-02-02 VITALS — BP 118/67 | HR 70 | Temp 98.0°F | Resp 18

## 2019-02-02 DIAGNOSIS — D61818 Other pancytopenia: Secondary | ICD-10-CM | POA: Insufficient documentation

## 2019-02-02 DIAGNOSIS — N1330 Unspecified hydronephrosis: Secondary | ICD-10-CM | POA: Diagnosis not present

## 2019-02-02 DIAGNOSIS — Z923 Personal history of irradiation: Secondary | ICD-10-CM | POA: Insufficient documentation

## 2019-02-02 DIAGNOSIS — N72 Inflammatory disease of cervix uteri: Secondary | ICD-10-CM | POA: Diagnosis present

## 2019-02-02 DIAGNOSIS — N950 Postmenopausal bleeding: Secondary | ICD-10-CM | POA: Insufficient documentation

## 2019-02-02 DIAGNOSIS — Z808 Family history of malignant neoplasm of other organs or systems: Secondary | ICD-10-CM | POA: Diagnosis not present

## 2019-02-02 DIAGNOSIS — Z9221 Personal history of antineoplastic chemotherapy: Secondary | ICD-10-CM | POA: Diagnosis not present

## 2019-02-02 DIAGNOSIS — F17210 Nicotine dependence, cigarettes, uncomplicated: Secondary | ICD-10-CM | POA: Diagnosis not present

## 2019-02-02 DIAGNOSIS — C531 Malignant neoplasm of exocervix: Secondary | ICD-10-CM | POA: Insufficient documentation

## 2019-02-02 DIAGNOSIS — C530 Malignant neoplasm of endocervix: Secondary | ICD-10-CM

## 2019-02-02 DIAGNOSIS — C539 Malignant neoplasm of cervix uteri, unspecified: Secondary | ICD-10-CM

## 2019-02-02 HISTORY — DX: Other fatigue: R53.83

## 2019-02-02 HISTORY — PX: OPERATIVE ULTRASOUND: SHX5996

## 2019-02-02 HISTORY — PX: TANDEM RING INSERTION: SHX6199

## 2019-02-02 LAB — CBC WITH DIFFERENTIAL/PLATELET
Abs Immature Granulocytes: 0 10*3/uL (ref 0.00–0.07)
Basophils Absolute: 0 10*3/uL (ref 0.0–0.1)
Basophils Relative: 0 %
Eosinophils Absolute: 0.1 10*3/uL (ref 0.0–0.5)
Eosinophils Relative: 2 %
HCT: 25.4 % — ABNORMAL LOW (ref 36.0–46.0)
Hemoglobin: 8.2 g/dL — ABNORMAL LOW (ref 12.0–15.0)
Immature Granulocytes: 0 %
Lymphocytes Relative: 24 %
Lymphs Abs: 0.5 10*3/uL — ABNORMAL LOW (ref 0.7–4.0)
MCH: 33.1 pg (ref 26.0–34.0)
MCHC: 32.3 g/dL (ref 30.0–36.0)
MCV: 102.4 fL — ABNORMAL HIGH (ref 80.0–100.0)
Monocytes Absolute: 0.3 10*3/uL (ref 0.1–1.0)
Monocytes Relative: 12 %
Neutro Abs: 1.3 10*3/uL — ABNORMAL LOW (ref 1.7–7.7)
Neutrophils Relative %: 62 %
Platelets: 65 10*3/uL — ABNORMAL LOW (ref 150–400)
RBC: 2.48 MIL/uL — ABNORMAL LOW (ref 3.87–5.11)
RDW: 20.9 % — ABNORMAL HIGH (ref 11.5–15.5)
WBC: 2.1 10*3/uL — ABNORMAL LOW (ref 4.0–10.5)
nRBC: 0 % (ref 0.0–0.2)

## 2019-02-02 SURGERY — INSERTION, UTERINE TANDEM AND RING OR CYLINDER, FOR BRACHYTHERAPY
Anesthesia: General

## 2019-02-02 MED ORDER — METOCLOPRAMIDE HCL 5 MG/ML IJ SOLN
10.0000 mg | Freq: Once | INTRAMUSCULAR | Status: DC | PRN
  Filled 2019-02-02: qty 2

## 2019-02-02 MED ORDER — MIDAZOLAM HCL 2 MG/2ML IJ SOLN
INTRAMUSCULAR | Status: AC
  Filled 2019-02-02: qty 2

## 2019-02-02 MED ORDER — OXYCODONE HCL 5 MG PO TABS
5.0000 mg | ORAL_TABLET | Freq: Once | ORAL | Status: DC | PRN
  Filled 2019-02-02: qty 1

## 2019-02-02 MED ORDER — FENTANYL CITRATE (PF) 100 MCG/2ML IJ SOLN
25.0000 ug | INTRAMUSCULAR | Status: DC | PRN
  Filled 2019-02-02: qty 1

## 2019-02-02 MED ORDER — KETOROLAC TROMETHAMINE 30 MG/ML IJ SOLN
INTRAMUSCULAR | Status: AC
  Filled 2019-02-02: qty 1

## 2019-02-02 MED ORDER — DEXAMETHASONE SODIUM PHOSPHATE 4 MG/ML IJ SOLN
INTRAMUSCULAR | Status: DC | PRN
  Administered 2019-02-02: 10 mg via INTRAVENOUS

## 2019-02-02 MED ORDER — LACTATED RINGERS IV SOLN
INTRAVENOUS | Status: DC
  Administered 2019-02-02: 06:00:00 via INTRAVENOUS
  Filled 2019-02-02: qty 1000

## 2019-02-02 MED ORDER — PROPOFOL 10 MG/ML IV BOLUS
INTRAVENOUS | Status: DC | PRN
  Administered 2019-02-02: 150 mg via INTRAVENOUS

## 2019-02-02 MED ORDER — PHENYLEPHRINE 40 MCG/ML (10ML) SYRINGE FOR IV PUSH (FOR BLOOD PRESSURE SUPPORT)
PREFILLED_SYRINGE | INTRAVENOUS | Status: DC | PRN
  Administered 2019-02-02: 80 ug via INTRAVENOUS
  Administered 2019-02-02: 120 ug via INTRAVENOUS
  Administered 2019-02-02: 80 ug via INTRAVENOUS

## 2019-02-02 MED ORDER — LACTATED RINGERS IV SOLN
INTRAVENOUS | Status: DC
  Administered 2019-02-02: 10:00:00 via INTRAVENOUS
  Filled 2019-02-02 (×2): qty 250

## 2019-02-02 MED ORDER — DEXAMETHASONE SODIUM PHOSPHATE 10 MG/ML IJ SOLN
INTRAMUSCULAR | Status: AC
  Filled 2019-02-02: qty 1

## 2019-02-02 MED ORDER — HYDROMORPHONE HCL 1 MG/ML IJ SOLN
0.5000 mg | Freq: Once | INTRAMUSCULAR | Status: AC
  Administered 2019-02-02: 0.5 mg via INTRAVENOUS
  Filled 2019-02-02: qty 1

## 2019-02-02 MED ORDER — ONDANSETRON HCL 4 MG/2ML IJ SOLN
INTRAMUSCULAR | Status: AC
  Filled 2019-02-02: qty 2

## 2019-02-02 MED ORDER — FENTANYL CITRATE (PF) 100 MCG/2ML IJ SOLN
INTRAMUSCULAR | Status: AC
  Filled 2019-02-02: qty 2

## 2019-02-02 MED ORDER — MIDAZOLAM HCL 5 MG/5ML IJ SOLN
INTRAMUSCULAR | Status: DC | PRN
  Administered 2019-02-02: 2 mg via INTRAVENOUS

## 2019-02-02 MED ORDER — LIDOCAINE 2% (20 MG/ML) 5 ML SYRINGE
INTRAMUSCULAR | Status: DC | PRN
  Administered 2019-02-02: 60 mg via INTRAVENOUS

## 2019-02-02 MED ORDER — SODIUM CHLORIDE 0.9 % IR SOLN
Status: DC | PRN
  Administered 2019-02-02: 1

## 2019-02-02 MED ORDER — ONDANSETRON HCL 4 MG/2ML IJ SOLN
INTRAMUSCULAR | Status: DC | PRN
  Administered 2019-02-02: 4 mg via INTRAVENOUS

## 2019-02-02 MED ORDER — KETOROLAC TROMETHAMINE 30 MG/ML IJ SOLN
INTRAMUSCULAR | Status: DC | PRN
  Administered 2019-02-02: 30 mg via INTRAVENOUS

## 2019-02-02 MED ORDER — FENTANYL CITRATE (PF) 100 MCG/2ML IJ SOLN
INTRAMUSCULAR | Status: DC | PRN
  Administered 2019-02-02: 50 ug via INTRAVENOUS
  Administered 2019-02-02 (×2): 25 ug via INTRAVENOUS

## 2019-02-02 MED ORDER — MEPERIDINE HCL 25 MG/ML IJ SOLN
6.2500 mg | INTRAMUSCULAR | Status: DC | PRN
  Filled 2019-02-02: qty 1

## 2019-02-02 MED ORDER — OXYCODONE HCL 5 MG/5ML PO SOLN
5.0000 mg | Freq: Once | ORAL | Status: DC | PRN
  Filled 2019-02-02: qty 5

## 2019-02-02 SURGICAL SUPPLY — 19 items
BNDG CONFORM 2 STRL LF (GAUZE/BANDAGES/DRESSINGS) IMPLANT
COVER WAND RF STERILE (DRAPES) ×1 IMPLANT
DILATOR CANAL MILEX (MISCELLANEOUS) IMPLANT
GAUZE 4X4 16PLY RFD (DISPOSABLE) ×3 IMPLANT
GLOVE BIO SURGEON STRL SZ7.5 (GLOVE) ×6 IMPLANT
GOWN STRL REUS W/TWL LRG LVL3 (GOWN DISPOSABLE) ×3 IMPLANT
HOLDER FOLEY CATH W/STRAP (MISCELLANEOUS) ×3 IMPLANT
HOVERMATT SINGLE USE (MISCELLANEOUS) ×3 IMPLANT
IV NS 1000ML (IV SOLUTION) ×2
IV NS 1000ML BAXH (IV SOLUTION) ×1 IMPLANT
IV SET EXTENSION GRAVITY 40 LF (IV SETS) ×3 IMPLANT
KIT TURNOVER CYSTO (KITS) ×3 IMPLANT
PACK VAGINAL MINOR WOMEN LF (CUSTOM PROCEDURE TRAY) ×3 IMPLANT
PACKING VAGINAL (PACKING) IMPLANT
PAD ABD 8X10 STRL (GAUZE/BANDAGES/DRESSINGS) ×3 IMPLANT
PAD OB MATERNITY 4.3X12.25 (PERSONAL CARE ITEMS) ×2 IMPLANT
TOWEL OR 17X26 10 PK STRL BLUE (TOWEL DISPOSABLE) ×3 IMPLANT
TRAY FOLEY W/BAG SLVR 14FR (SET/KITS/TRAYS/PACK) ×3 IMPLANT
WATER STERILE IRR 500ML POUR (IV SOLUTION) ×3 IMPLANT

## 2019-02-02 NOTE — Addendum Note (Signed)
Encounter addended by: Loma Sousa, RN on: 02/02/2019 11:58 AM  Actions taken: Order list changed, Diagnosis association updated

## 2019-02-02 NOTE — Progress Notes (Signed)
  Radiation Oncology         [phone] ________________________________  Name: Jennifer Santos MRN: [id number]  Date: 02/02/2019  DOB: June 09, 1966  SIMULATION AND TREATMENT PLANNING NOTE HDR BRACHYTHERAPY  DIAGNOSIS:  Stage IIIC-2SquamousCellCarcinoma of theCervix  NARRATIVE:  The patient was brought to the [place].  Identity was confirmed.  All relevant records and images related to the planned course of therapy were reviewed.  The patient freely provided informed written consent to proceed with treatment after reviewing the details related to the planned course of therapy. The consent form was witnessed and verified by the simulation staff.  Then, the patient was set-up in a stable reproducible  supine position for radiation therapy.  CT images were obtained.  Surface markings were placed.  The CT images were loaded into the planning software.  Then the target and avoidance structures were contoured.  Treatment planning then occurred.  The radiation prescription was entered and confirmed.   I have requested : Brachytherapy Isodose Plan and Dosimetry Calculations to plan the radiation distribution.    PLAN:  The patient will receive 5.5 Gy in 1 fraction.  Patient will be treated with iridium 192 as the high-dose-rate source.  The tandem ring system will be used to deliver the treatment.    ________________________________  Blair Promise, PhD, MD

## 2019-02-02 NOTE — Progress Notes (Signed)
  Radiation Oncology         [phone] ________________________________  Name: Jennifer Santos MRN: [id number]  Date: 02/02/2019  DOB: [birth year]  CC: Marguerita Merles, MD  Nancy Marus, MD  HDR BRACHYTHERAPY NOTE  DIAGNOSIS: Stage IIIC-2SquamousCellCarcinoma of theCervix  NARRATIVE: The patient was brought to the [place] suite. Identity was confirmed. All relevant records and images related to the planned course of therapy were reviewed. The patient freely provided informed written consent to proceed with treatment after reviewing the details related to the planned course of therapy. The consent form was witnessed and verified by the simulation staff. Then, the patient was set-up in a stable reproducible supine position for radiation therapy. The tandem ring system was accessed and fiducial markers were placed within the tandem and ring.   Simple treatment device note: On the operating room the patient had construction of her custom tandem ring system. She will be treated with a 60 tandem/ring system. The patient had placement of a 40 mm tandem. A cervical ring with a small shielding was used for her treatment. A rectal paddle was also part of her custom set up device.  Verification simulation note: An AP and lateral film was obtained through the pelvis area. This was compared to the patient's planning films documenting accurate position of the tandem/ring system for treatment.  High-dose-rate brachytherapy treatment note:  The remote afterloading device was accessed through catheter system and attached to the tandem ring system. Patient then proceeded to undergo her third high-dose-rate treatment directed at the cervix. The patient was prescribed a dose of 5.5 gray to be delivered to the [place].Marland Kitchen Patient was treated with 2 channels using 21 dwell positions. Treatment time was 478.0 seconds. The patient tolerated the procedure well. After completion of her therapy, a radiation survey was  performed documenting return of the iridium source into the [employer] safe. The patient was then transferred to the nursing suite. She then had removal of the rectal paddle followed by the tandem and ring system. The patient tolerated the removal well.  PLAN: The patient will return next week for her fourth high-dose-rate treatment. ________________________________  Blair Promise, PhD, MD

## 2019-02-02 NOTE — Interval H&P Note (Signed)
History and Physical Interval Note:  02/02/2019 7:27 AM  Gardner Candle  has presented today for surgery, with the diagnosis of EXOCERVIX.  The various methods of treatment have been discussed with the patient and family. After consideration of risks, benefits and other options for treatment, the patient has consented to  Procedure(s): TANDEM RING INSERTION (N/A) OPERATIVE ULTRASOUND (N/A) as a surgical intervention.  The patient's history has been reviewed, patient examined, no change in status, stable for surgery.  I have reviewed the patient's chart and labs.  Questions were answered to the patient's satisfaction.     Gery Pray

## 2019-02-02 NOTE — Addendum Note (Signed)
Encounter addended by: Loma Sousa, RN on: 02/02/2019 12:07 PM  Actions taken: MAR administration accepted

## 2019-02-02 NOTE — Addendum Note (Signed)
Encounter addended by: Loma Sousa, RN on: 02/02/2019 9:29 AM  Actions taken: Order list changed, Diagnosis association updated

## 2019-02-02 NOTE — Anesthesia Procedure Notes (Signed)
Procedure Name: LMA Insertion Date/Time: 02/02/2019 7:32 AM Performed by: Lieutenant Diego, CRNA Pre-anesthesia Checklist: Patient identified, Emergency Drugs available, Suction available and Patient being monitored Patient Re-evaluated:Patient Re-evaluated prior to induction Oxygen Delivery Method: Circle system utilized Preoxygenation: Pre-oxygenation with 100% oxygen Induction Type: IV induction Ventilation: Mask ventilation without difficulty LMA: LMA inserted LMA Size: 4.0 Number of attempts: 1 Placement Confirmation: positive ETCO2 and breath sounds checked- equal and bilateral Tube secured with: Tape Dental Injury: Teeth and Oropharynx as per pre-operative assessment

## 2019-02-02 NOTE — Progress Notes (Signed)
Pt rested while in radiation oncology nursing. Consuming regular diet and liquids without difficulty. Tandem and ring device removed per Dr. Sondra Come, pt tolerated well. Foley removed, pt tolerated well. PIV removed, catheter tip intact, pt tolerated well. Pt discharged to adult son's care. Pt able to verbalize discharge instructions. Loma Sousa, RN BSN

## 2019-02-02 NOTE — Op Note (Signed)
02/02/2019  8:56 AM  PATIENT:  Jennifer Santos  53 y.o. female  PRE-OPERATIVE DIAGNOSIS:  EXOCERVIX  POST-OPERATIVE DIAGNOSIS:  EXOCERVIX  PROCEDURE:  Procedure(s): TANDEM RING INSERTION (N/A) OPERATIVE ULTRASOUND (N/A)  SURGEON:  Surgeon(s) and Role:    * Gery Pray, MD - Primary  PHYSICIAN ASSISTANT:   ASSISTANTS: none   ANESTHESIA:   general  EBL:  5 mL   BLOOD ADMINISTERED:none  DRAINS: Urinary Catheter (Foley)   LOCAL MEDICATIONS USED:  NONE  SPECIMEN:  No Specimen  DISPOSITION OF SPECIMEN:  N/A  COUNTS:  YES  TOURNIQUET:  * No tourniquets in log *  DICTATION: Patient was taken to the outpatient OR #3. Patient was prepped and draped in usual sterile fashion and placed in the dorsolithotomy position. Timeout was performed for preoperative medications, procedure as well as patient identification. The patient proceeded to undergo placement of a Foley catheter without difficulty. The bladder was backfilled with approximately 250 cc sterile water. Patient proceeded to undergo exam under anesthesia the cervical fornices are absent due to prior extensive tumor. Bimanual and rectovaginal examination patient continues to have significant firm induration extending into the right uterosacral ligament towards the right pelvic sidewall. The leftparametrialarea was Free of tumor. The size of the cervical lesion was difficult to determine given the significantly altered anatomy. Patient then proceeded to undergo sounding of the uterus with ultrasound guidance. The patient initially had poor ultrasound image quality but then with manipulation and changing of the probe,  the images improved. The uterus noted to be minimally anteverted and sounded to approximately 6.5 cm. Patient was noted to have a significant bowel gasin the pelvis region whichsomewhat obscured ultrasound images initially.    The patient proceeded to undergo dilation of the cervical loss.  Patient then had  a 40 mm cervical sleeve placed within the endocervical and endometrial cavity.this was then followed by placement of a 40 mm 60 degree tandem within the endocervical and endometrial canal. Images showed good placement centrally within the endometrial canal. Patient then had placement of a ring with a small shielding capIn place. This was then followed by placement of a rectal paddle. There is no room for vaginal packing present. Ultrasound images at the completion of the procedure showed good placement of the tandem within the endocervical canal and endometrial cavity. Patient tolerated the procedure well. She was subsequently transferred to the recovery room in stable condition. Later in the day the patient will be taken down to the radiation oncology for planning and CT scan imaging followed by her third high-dose-rate brachii therapy treatment. The patient will be treated with iridium 192 is the high-dose-rate source. She is planning to have 5 high-dose-rate procedures.  PLAN OF CARE: Transferred to radiation oncology for planning and treatment  PATIENT DISPOSITION:  PACU - hemodynamically stable.   Delay start of Pharmacological VTE agent (>24hrs) due to surgical blood loss or risk of bleeding: not applicable

## 2019-02-02 NOTE — Addendum Note (Signed)
Encounter addended by: Loma Sousa, RN on: 02/02/2019 9:48 AM  Actions taken: MAR administration accepted

## 2019-02-02 NOTE — Transfer of Care (Signed)
Immediate Anesthesia Transfer of Care Note  Patient: Jennifer Santos  Procedure(s) Performed: TANDEM RING INSERTION (N/A ) OPERATIVE ULTRASOUND (N/A )  Patient Location: PACU  Anesthesia Type:General  Level of Consciousness: awake and alert   Airway & Oxygen Therapy: Patient Spontanous Breathing and Patient connected to nasal cannula oxygen  Post-op Assessment: Report given to RN and Post -op Vital signs reviewed and stable  Post vital signs: Reviewed and stable  Last Vitals:  Vitals Value Taken Time  BP    Temp    Pulse    Resp    SpO2      Last Pain:  Vitals:   02/02/19 0558  TempSrc:   PainSc: 0-No pain      Patients Stated Pain Goal: 7 (56/72/09 1980)  Complications: No apparent anesthesia complications

## 2019-02-02 NOTE — Addendum Note (Signed)
Encounter addended by: Loma Sousa, RN on: 02/02/2019 2:45 PM  Actions taken: Flowsheet accepted, Clinical Note Signed

## 2019-02-02 NOTE — Anesthesia Postprocedure Evaluation (Signed)
Anesthesia Post Note  Patient: Torrie Namba  Procedure(s) Performed: TANDEM RING INSERTION (N/A ) OPERATIVE ULTRASOUND (N/A )     Patient location during evaluation: PACU Anesthesia Type: General Level of consciousness: awake and alert and oriented Pain management: pain level controlled Vital Signs Assessment: post-procedure vital signs reviewed and stable Respiratory status: spontaneous breathing, nonlabored ventilation and respiratory function stable Cardiovascular status: blood pressure returned to baseline and stable Postop Assessment: no apparent nausea or vomiting Anesthetic complications: no    Last Vitals:  Vitals:   02/02/19 0848 02/02/19 0852  BP:    Pulse: 64 69  Resp: 18 (!) 23  Temp: 36.5 C   SpO2: 100% 100%    Last Pain:  Vitals:   02/02/19 0848  TempSrc: Oral  PainSc:                  Vivianne Carles A.

## 2019-02-03 ENCOUNTER — Other Ambulatory Visit: Payer: Self-pay | Admitting: Hematology and Oncology

## 2019-02-03 ENCOUNTER — Telehealth: Payer: Self-pay | Admitting: Oncology

## 2019-02-03 DIAGNOSIS — D61818 Other pancytopenia: Secondary | ICD-10-CM

## 2019-02-03 NOTE — Telephone Encounter (Signed)
Called Jennifer Santos and advised her that the scheduler will be calling with a lab appointment and blood appointment on 02/10/19.  Discussed that Dr. Alvy Bimler will review her lab results from 02/10/19 to see if she will need blood that day. She verbalized understanding and agreement.

## 2019-02-04 ENCOUNTER — Encounter (HOSPITAL_BASED_OUTPATIENT_CLINIC_OR_DEPARTMENT_OTHER): Payer: Self-pay | Admitting: Radiation Oncology

## 2019-02-04 ENCOUNTER — Telehealth: Payer: Self-pay | Admitting: Hematology and Oncology

## 2019-02-04 NOTE — Telephone Encounter (Signed)
Scheduled appt per 7/1 sch message - unable to reach pt . Left message with appt date and time

## 2019-02-05 ENCOUNTER — Other Ambulatory Visit (HOSPITAL_COMMUNITY): Payer: Self-pay | Admitting: Radiation Oncology

## 2019-02-05 DIAGNOSIS — C531 Malignant neoplasm of exocervix: Secondary | ICD-10-CM

## 2019-02-08 ENCOUNTER — Encounter (HOSPITAL_BASED_OUTPATIENT_CLINIC_OR_DEPARTMENT_OTHER): Payer: Self-pay | Admitting: *Deleted

## 2019-02-08 ENCOUNTER — Other Ambulatory Visit: Payer: Self-pay

## 2019-02-08 ENCOUNTER — Other Ambulatory Visit (HOSPITAL_COMMUNITY)
Admission: RE | Admit: 2019-02-08 | Discharge: 2019-02-08 | Disposition: A | Discharge status: Home / Self Care | Payer: BLUE CROSS/BLUE SHIELD | Source: Ambulatory Visit | Attending: Urology | Admitting: Urology

## 2019-02-08 DIAGNOSIS — Z1159 Encounter for screening for other viral diseases: Secondary | ICD-10-CM | POA: Diagnosis not present

## 2019-02-08 DIAGNOSIS — Z01812 Encounter for preprocedural laboratory examination: Secondary | ICD-10-CM | POA: Insufficient documentation

## 2019-02-08 NOTE — Progress Notes (Signed)

## 2019-02-09 LAB — SARS CORONAVIRUS 2 (TAT 6-24 HRS): SARS Coronavirus 2: NEGATIVE

## 2019-02-10 ENCOUNTER — Inpatient Hospital Stay: Payer: BLUE CROSS/BLUE SHIELD | Attending: Gynecologic Oncology

## 2019-02-10 ENCOUNTER — Other Ambulatory Visit: Payer: Self-pay

## 2019-02-10 ENCOUNTER — Inpatient Hospital Stay: Payer: BLUE CROSS/BLUE SHIELD

## 2019-02-10 ENCOUNTER — Telehealth: Payer: Self-pay

## 2019-02-10 DIAGNOSIS — D61818 Other pancytopenia: Secondary | ICD-10-CM

## 2019-02-10 DIAGNOSIS — C531 Malignant neoplasm of exocervix: Secondary | ICD-10-CM | POA: Diagnosis not present

## 2019-02-10 LAB — SAMPLE TO BLOOD BANK

## 2019-02-10 LAB — CBC WITH DIFFERENTIAL/PLATELET
Abs Immature Granulocytes: 0.02 10*3/uL (ref 0.00–0.07)
Basophils Absolute: 0 10*3/uL (ref 0.0–0.1)
Basophils Relative: 0 %
Eosinophils Absolute: 0.1 10*3/uL (ref 0.0–0.5)
Eosinophils Relative: 2 %
HCT: 26.4 % — ABNORMAL LOW (ref 36.0–46.0)
Hemoglobin: 8.6 g/dL — ABNORMAL LOW (ref 12.0–15.0)
Immature Granulocytes: 1 %
Lymphocytes Relative: 29 %
Lymphs Abs: 0.7 10*3/uL (ref 0.7–4.0)
MCH: 33.5 pg (ref 26.0–34.0)
MCHC: 32.6 g/dL (ref 30.0–36.0)
MCV: 102.7 fL — ABNORMAL HIGH (ref 80.0–100.0)
Monocytes Absolute: 0.4 10*3/uL (ref 0.1–1.0)
Monocytes Relative: 16 %
Neutro Abs: 1.2 10*3/uL — ABNORMAL LOW (ref 1.7–7.7)
Neutrophils Relative %: 52 %
Platelets: 101 10*3/uL — ABNORMAL LOW (ref 150–400)
RBC: 2.57 MIL/uL — ABNORMAL LOW (ref 3.87–5.11)
RDW: 18.8 % — ABNORMAL HIGH (ref 11.5–15.5)
WBC: 2.3 10*3/uL — ABNORMAL LOW (ref 4.0–10.5)
nRBC: 0 % (ref 0.0–0.2)

## 2019-02-10 NOTE — Telephone Encounter (Signed)
Given copy of CBC results and infusion appt for today canceled for blood transfusion. She verbalized understanding.

## 2019-02-10 NOTE — Telephone Encounter (Signed)
Called and left a message to call the office back. Dr. Alvy Bimler would like to repeat labs next Wednesday 7/15.

## 2019-02-11 ENCOUNTER — Other Ambulatory Visit: Payer: Self-pay

## 2019-02-11 ENCOUNTER — Ambulatory Visit
Admission: RE | Admit: 2019-02-11 | Discharge: 2019-02-11 | Disposition: A | Discharge status: Home / Self Care | Payer: BLUE CROSS/BLUE SHIELD | Source: Ambulatory Visit | Attending: Radiation Oncology | Admitting: Radiation Oncology

## 2019-02-11 ENCOUNTER — Ambulatory Visit (HOSPITAL_BASED_OUTPATIENT_CLINIC_OR_DEPARTMENT_OTHER)
Admission: RE | Admit: 2019-02-11 | Discharge: 2019-02-11 | Disposition: A | Discharge status: Other Acute Inpatient Hospital | Payer: BLUE CROSS/BLUE SHIELD | Attending: Radiation Oncology | Admitting: Radiation Oncology

## 2019-02-11 ENCOUNTER — Encounter (HOSPITAL_BASED_OUTPATIENT_CLINIC_OR_DEPARTMENT_OTHER): Payer: Self-pay | Admitting: *Deleted

## 2019-02-11 ENCOUNTER — Ambulatory Visit (HOSPITAL_BASED_OUTPATIENT_CLINIC_OR_DEPARTMENT_OTHER): Payer: BLUE CROSS/BLUE SHIELD | Admitting: Certified Registered"

## 2019-02-11 ENCOUNTER — Encounter (HOSPITAL_BASED_OUTPATIENT_CLINIC_OR_DEPARTMENT_OTHER)
Admission: RE | Disposition: A | Discharge status: Other Acute Inpatient Hospital | Payer: Self-pay | Source: Home / Self Care | Attending: Radiation Oncology

## 2019-02-11 ENCOUNTER — Ambulatory Visit (HOSPITAL_COMMUNITY)
Admission: RE | Admit: 2019-02-11 | Discharge: 2019-02-11 | Disposition: A | Discharge status: Home / Self Care | Payer: BLUE CROSS/BLUE SHIELD | Source: Ambulatory Visit | Attending: Radiation Oncology | Admitting: Radiation Oncology

## 2019-02-11 DIAGNOSIS — C530 Malignant neoplasm of endocervix: Secondary | ICD-10-CM

## 2019-02-11 DIAGNOSIS — Z801 Family history of malignant neoplasm of trachea, bronchus and lung: Secondary | ICD-10-CM | POA: Insufficient documentation

## 2019-02-11 DIAGNOSIS — C531 Malignant neoplasm of exocervix: Secondary | ICD-10-CM

## 2019-02-11 DIAGNOSIS — Z923 Personal history of irradiation: Secondary | ICD-10-CM | POA: Diagnosis not present

## 2019-02-11 DIAGNOSIS — Z808 Family history of malignant neoplasm of other organs or systems: Secondary | ICD-10-CM | POA: Diagnosis not present

## 2019-02-11 DIAGNOSIS — Z9221 Personal history of antineoplastic chemotherapy: Secondary | ICD-10-CM | POA: Insufficient documentation

## 2019-02-11 DIAGNOSIS — Z510 Encounter for antineoplastic radiation therapy: Secondary | ICD-10-CM | POA: Insufficient documentation

## 2019-02-11 DIAGNOSIS — C539 Malignant neoplasm of cervix uteri, unspecified: Secondary | ICD-10-CM

## 2019-02-11 DIAGNOSIS — F17210 Nicotine dependence, cigarettes, uncomplicated: Secondary | ICD-10-CM | POA: Insufficient documentation

## 2019-02-11 DIAGNOSIS — N950 Postmenopausal bleeding: Secondary | ICD-10-CM | POA: Diagnosis not present

## 2019-02-11 HISTORY — PX: TANDEM RING INSERTION: SHX6199

## 2019-02-11 HISTORY — PX: OPERATIVE ULTRASOUND: SHX5996

## 2019-02-11 SURGERY — INSERTION, UTERINE TANDEM AND RING OR CYLINDER, FOR BRACHYTHERAPY
Anesthesia: General | Site: Pelvis

## 2019-02-11 MED ORDER — LIDOCAINE 2% (20 MG/ML) 5 ML SYRINGE
INTRAMUSCULAR | Status: AC
  Filled 2019-02-11: qty 5

## 2019-02-11 MED ORDER — LACTATED RINGERS IV SOLN
INTRAVENOUS | Status: DC
  Administered 2019-02-11: 08:00:00 via INTRAVENOUS
  Filled 2019-02-11: qty 1000

## 2019-02-11 MED ORDER — HYDROMORPHONE HCL 1 MG/ML IJ SOLN
0.5000 mg | Freq: Once | INTRAMUSCULAR | Status: AC
  Administered 2019-02-11: 0.5 mg via INTRAVENOUS
  Filled 2019-02-11: qty 1

## 2019-02-11 MED ORDER — PROPOFOL 10 MG/ML IV BOLUS
INTRAVENOUS | Status: DC | PRN
  Administered 2019-02-11: 150 mg via INTRAVENOUS

## 2019-02-11 MED ORDER — KETOROLAC TROMETHAMINE 30 MG/ML IJ SOLN
INTRAMUSCULAR | Status: DC | PRN
  Administered 2019-02-11: 30 mg via INTRAVENOUS

## 2019-02-11 MED ORDER — PROPOFOL 10 MG/ML IV BOLUS
INTRAVENOUS | Status: AC
  Filled 2019-02-11: qty 20

## 2019-02-11 MED ORDER — KETOROLAC TROMETHAMINE 30 MG/ML IJ SOLN
INTRAMUSCULAR | Status: AC
  Filled 2019-02-11: qty 1

## 2019-02-11 MED ORDER — LIDOCAINE 2% (20 MG/ML) 5 ML SYRINGE
INTRAMUSCULAR | Status: DC | PRN
  Administered 2019-02-11: 50 mg via INTRAVENOUS

## 2019-02-11 MED ORDER — FENTANYL CITRATE (PF) 100 MCG/2ML IJ SOLN
INTRAMUSCULAR | Status: DC | PRN
  Administered 2019-02-11: 50 ug via INTRAVENOUS

## 2019-02-11 MED ORDER — MIDAZOLAM HCL 2 MG/2ML IJ SOLN
INTRAMUSCULAR | Status: DC | PRN
  Administered 2019-02-11: 1 mg via INTRAVENOUS

## 2019-02-11 MED ORDER — ESTRADIOL 0.1 MG/GM VA CREA
TOPICAL_CREAM | VAGINAL | Status: AC
  Filled 2019-02-11: qty 42.5

## 2019-02-11 MED ORDER — FENTANYL CITRATE (PF) 100 MCG/2ML IJ SOLN
INTRAMUSCULAR | Status: AC
  Filled 2019-02-11: qty 2

## 2019-02-11 MED ORDER — DEXAMETHASONE SODIUM PHOSPHATE 10 MG/ML IJ SOLN
INTRAMUSCULAR | Status: AC
  Filled 2019-02-11: qty 1

## 2019-02-11 MED ORDER — LACTATED RINGERS IV SOLN
INTRAVENOUS | Status: DC
  Administered 2019-02-11: 12:00:00 via INTRAVENOUS
  Filled 2019-02-11 (×2): qty 250

## 2019-02-11 MED ORDER — ONDANSETRON HCL 4 MG/2ML IJ SOLN
INTRAMUSCULAR | Status: DC | PRN
  Administered 2019-02-11: 4 mg via INTRAVENOUS

## 2019-02-11 MED ORDER — DEXAMETHASONE SODIUM PHOSPHATE 10 MG/ML IJ SOLN
INTRAMUSCULAR | Status: DC | PRN
  Administered 2019-02-11: 10 mg via INTRAVENOUS

## 2019-02-11 MED ORDER — MIDAZOLAM HCL 2 MG/2ML IJ SOLN
INTRAMUSCULAR | Status: AC
  Filled 2019-02-11: qty 2

## 2019-02-11 MED ORDER — SODIUM CHLORIDE 0.9 % IR SOLN
Status: DC | PRN
  Administered 2019-02-11: 200 mL via INTRAVESICAL

## 2019-02-11 MED ORDER — PHENYLEPHRINE 40 MCG/ML (10ML) SYRINGE FOR IV PUSH (FOR BLOOD PRESSURE SUPPORT)
PREFILLED_SYRINGE | INTRAVENOUS | Status: DC | PRN
  Administered 2019-02-11: 40 ug via INTRAVENOUS
  Administered 2019-02-11: 80 ug via INTRAVENOUS

## 2019-02-11 MED ORDER — ONDANSETRON HCL 4 MG/2ML IJ SOLN
INTRAMUSCULAR | Status: AC
  Filled 2019-02-11: qty 2

## 2019-02-11 SURGICAL SUPPLY — 20 items
BNDG CONFORM 2 STRL LF (GAUZE/BANDAGES/DRESSINGS) IMPLANT
COVER WAND RF STERILE (DRAPES) ×4 IMPLANT
DILATOR CANAL MILEX (MISCELLANEOUS) IMPLANT
GAUZE 4X4 16PLY RFD (DISPOSABLE) ×4 IMPLANT
GLOVE BIO SURGEON STRL SZ7.5 (GLOVE) ×8 IMPLANT
GOWN STRL REUS W/TWL LRG LVL3 (GOWN DISPOSABLE) ×4 IMPLANT
HOLDER FOLEY CATH W/STRAP (MISCELLANEOUS) ×4 IMPLANT
HOVERMATT SINGLE USE (MISCELLANEOUS) ×4 IMPLANT
IV NS 1000ML (IV SOLUTION) ×2
IV NS 1000ML BAXH (IV SOLUTION) ×2 IMPLANT
IV SET EXTENSION GRAVITY 40 LF (IV SETS) ×4 IMPLANT
KIT TURNOVER CYSTO (KITS) ×4 IMPLANT
NS IRRIG 500ML POUR BTL (IV SOLUTION) ×2 IMPLANT
PACK VAGINAL MINOR WOMEN LF (CUSTOM PROCEDURE TRAY) ×4 IMPLANT
PACKING VAGINAL (PACKING) IMPLANT
PAD ABD 8X10 STRL (GAUZE/BANDAGES/DRESSINGS) ×4 IMPLANT
PAD OB MATERNITY 4.3X12.25 (PERSONAL CARE ITEMS) IMPLANT
TOWEL OR 17X26 10 PK STRL BLUE (TOWEL DISPOSABLE) ×4 IMPLANT
TRAY FOLEY W/BAG SLVR 14FR (SET/KITS/TRAYS/PACK) ×4 IMPLANT
WATER STERILE IRR 500ML POUR (IV SOLUTION) ×2 IMPLANT

## 2019-02-11 NOTE — Op Note (Signed)
02/11/2019  10:31 AM  PATIENT:  Jennifer Santos  53 y.o. female  PRE-OPERATIVE DIAGNOSIS:  EXOCERVIX  POST-OPERATIVE DIAGNOSIS:  EXOCERVIX  PROCEDURE:  Procedure(s): TANDEM RING INSERTION (N/A) OPERATIVE ULTRASOUND (N/A)  SURGEON:  Surgeon(s) and Role:    * Gery Pray, MD - Primary  PHYSICIAN ASSISTANT:   ASSISTANTS: none   ANESTHESIA:   general  EBL:  none   BLOOD ADMINISTERED:none  DRAINS: Urinary Catheter (Foley)   LOCAL MEDICATIONS USED:  NONE  SPECIMEN:  No Specimen  DISPOSITION OF SPECIMEN:  N/A  COUNTS:  YES  TOURNIQUET:  * No tourniquets in log *  DICTATION: Patient was taken to the outpatient OR #1. Patient was prepped and draped in usual sterile fashion and placed in the dorsolithotomy position. Timeout was performed for preoperative medications, procedure as well as patient identification. The patient proceeded to undergo placement of a Foley catheter without difficulty. The bladder was backfilled with approximately 200 cc sterile water. Patient proceeded to undergo exam under anesthesia the cervical fornices are absent due topriorextensive tumor. Bimanual and rectovaginal examination patient continues to have significant firm induration extending into the right uterosacral ligament towards the right pelvic sidewall but improved this week. The leftparametrialarea was Free of tumor. The size of the cervical lesion was difficult to determine given the significantly altered anatomy.  The patient initially had poor ultrasound image quality but then with manipulation and changing of the probe,  the images improved. The uterus noted to be minimally anteverted. Patient was noted to have a significant bowel gasin the pelvis region whichsomewhat obscured ultrasound images initially.  The cervical sleeve procedure last week place patient was not required.  The patient had placement of a 40 mm 60 degree tandem within the endocervical and endometrial canal.  Images showed good placement centrally within the endometrial canal. Patient then had placement of a ring with a small shielding capIn place. This was then followed by placement of a rectal paddle. There is no room for vaginal packing present. Ultrasound images at the completion of the procedure showed good placement of the tandem within the endocervical canal and endometrial cavity. Patient tolerated the procedure well. She was subsequently transferred to the recovery room in stable condition. Later in the day the patient will be taken down to the radiation oncology for planning and CT scan imaging followed by herfourthhigh-dose-rate brachii therapy treatment. The patient will be treated with iridium 192 is the high-dose-rate source. She is planning to have 5 high-dose-rate procedures.  PLAN OF CARE: Transferred to radiation oncology for planning and treatment  PATIENT DISPOSITION:  PACU - hemodynamically stable.   Delay start of Pharmacological VTE agent (>24hrs) due to surgical blood loss or risk of bleeding: not applicable

## 2019-02-11 NOTE — Telephone Encounter (Signed)
Patient in appt with radiation today. Given appt for 7/15 at 1 pm to [facility] who will give to patient.

## 2019-02-11 NOTE — Anesthesia Preprocedure Evaluation (Addendum)
Anesthesia Evaluation  Patient identified by MRN, date of birth, ID band Patient awake    Reviewed: Allergy & Precautions, NPO status , Patient's Chart, lab work & pertinent test results  Airway Mallampati: I  TM Distance: >3 FB Neck ROM: Full    Dental  (+) Teeth Intact, Dental Advisory Given   Pulmonary Current Smoker,    breath sounds clear to auscultation       Cardiovascular negative cardio ROS   Rhythm:Regular Rate:Normal     Neuro/Psych negative neurological ROS  negative psych ROS   GI/Hepatic negative GI ROS, Neg liver ROS,   Endo/Other  negative endocrine ROS  Renal/GU      Musculoskeletal negative musculoskeletal ROS (+)   Abdominal Normal abdominal exam  (+)   Peds  Hematology negative hematology ROS (+)   Anesthesia Other Findings   Reproductive/Obstetrics                            Anesthesia Physical Anesthesia Plan  ASA: II  Anesthesia Plan: General   Post-op Pain Management:    Induction: Intravenous  PONV Risk Score and Plan: 3 and Ondansetron, Dexamethasone and Midazolam  Airway Management Planned: LMA  Additional Equipment: None  Intra-op Plan:   Post-operative Plan: Extubation in OR  Informed Consent: I have reviewed the patients History and Physical, chart, labs and discussed the procedure including the risks, benefits and alternatives for the proposed anesthesia with the patient or authorized representative who has indicated his/her understanding and acceptance.     Dental advisory given  Plan Discussed with: CRNA  Anesthesia Plan Comments:        Anesthesia Quick Evaluation

## 2019-02-11 NOTE — Anesthesia Procedure Notes (Signed)
Procedure Name: LMA Insertion Date/Time: 02/11/2019 9:38 AM Performed by: Suan Halter, CRNA Pre-anesthesia Checklist: Patient identified, Emergency Drugs available, Suction available and Patient being monitored Patient Re-evaluated:Patient Re-evaluated prior to induction Oxygen Delivery Method: Circle system utilized Preoxygenation: Pre-oxygenation with 100% oxygen Induction Type: IV induction Ventilation: Mask ventilation without difficulty LMA: LMA inserted LMA Size: 3.0 Number of attempts: 1 Airway Equipment and Method: Bite block Placement Confirmation: positive ETCO2 Tube secured with: Tape Dental Injury: Teeth and Oropharynx as per pre-operative assessment

## 2019-02-11 NOTE — Anesthesia Postprocedure Evaluation (Signed)
Anesthesia Post Note  Patient: Jennifer Santos  Procedure(s) Performed: TANDEM RING INSERTION (N/A Cervix) OPERATIVE ULTRASOUND (N/A Pelvis)     Patient location during evaluation: PACU Anesthesia Type: General Level of consciousness: awake and alert Pain management: pain level controlled Vital Signs Assessment: post-procedure vital signs reviewed and stable Respiratory status: spontaneous breathing, nonlabored ventilation, respiratory function stable and patient connected to nasal cannula oxygen Cardiovascular status: blood pressure returned to baseline and stable Postop Assessment: no apparent nausea or vomiting Anesthetic complications: no    Last Vitals:  Vitals:   02/11/19 1100 02/11/19 1108  BP: 104/64   Pulse: 69 71  Resp: 16 13  Temp:    SpO2: 100% 100%    Last Pain:  Vitals:   02/11/19 1045  TempSrc:   PainSc: 0-No pain                 Effie Berkshire

## 2019-02-11 NOTE — H&P (Signed)
Radiation Oncology         [phone] ________________________________  History and physical examination  Name: Jennifer Santos MRN: [id number]  Date: 02/11/2019  DOB: [birth year]   DIAGNOSIS: Stage IIIb Clinical Squamous Cell Carcinoma of the Cervix   HISTORY OF PRESENT ILLNESS::Jennifer Santos is a 53 y.o. female who  presented with postmenopausal bleeding. She reports it began approximately 9 months to a year ago as light bleeding every week. The bleeding increased, and she passed some very heavy clots about 9 weeks ago. It was at this point that she sought medical attention, and she subsequently met with Dr. Kenton Kingfisher on 11/04/2018. He performed Pap smear and biopsy in the office that day. Pap smear revealed malignant cells, and the endometrium biopsy confirmed invasive moderately differentiated squamous cell carcinoma.   She met with Dr. Alycia Rossetti on 11/11/2018. Per Dr. Elenora Gamma note, pelvic examination shows: external genitalia within normal limits; the vagina is well but supported but there was a large crater-like mass at the top of the vaginal apex; there is no normal cervix identifiable; on bimanual examination there is sidewall involvement of an 8 cm mass on the left side; the right sidewall is free but there is parametrial involvement on the right side but it does not extend to the sidewall, rectal confirms.  She underwent chest/abdomen/pelvis CT scans on 11/17/2018. These revealed: irregular enlargement of the cervix with extension of soft tissue from the cervix into the posterior presacral space along the side of the rectum, wall thickening in the upper vagina is compatible with tumor involvement, the endometrial cavity of the uterus is markedly expanded and fluid-filled consistent with obstruction, no definite involvement of the posterior bladder wall evident by CT; moderate right hydroureteronephrosis with ureteral dilatation extending down to the right pelvic sidewall adjacent to the cervix;  abnormal, amorphous soft tissue surrounds the distal aorta and common iliac arteries, imaging appearance concerning for tumor spread; no evidence for metastatic disease in the chest.  Patient has recently completed her course of external beam radiation therapy along with radiosensitizing chemotherapy.  She is now ready to proceed with intracavitary brachii therapy treatments to complete her definitive course of therapy.     PAST MEDICAL HISTORY:  has a past medical history of Acquired pancytopenia ([place]), Cervical cancer, FIGO stage IIIB [facility]) (oncologist-- dr gorsuch/  dr Sondra Come), Compression fracture of T12 vertebra ([place]), Fatigue, History of cancer chemotherapy (11-27-2018  to 01-13-2019), History of external beam radiation therapy (11-30-2018  to 01-11-2019), and Hydronephrosis, right.    PAST SURGICAL HISTORY: Past Surgical History:  Procedure Laterality Date   BACK SURGERY  1985   Removal of superficial mass   IR IMAGING GUIDED PORT INSERTION  11/25/2018   OPERATIVE ULTRASOUND N/A 01/21/2019   Procedure: OPERATIVE ULTRASOUND;  Surgeon: Gery Pray, MD;  Location: [facility];  Service: Urology;  Laterality: N/A;   OPERATIVE ULTRASOUND N/A 01/25/2019   Procedure: OPERATIVE ULTRASOUND;  Surgeon: Gery Pray, MD;  Location: [facility];  Service: Urology;  Laterality: N/A;   OPERATIVE ULTRASOUND N/A 02/02/2019   Procedure: OPERATIVE ULTRASOUND;  Surgeon: Gery Pray, MD;  Location[facility];  Service: Urology;  Laterality: N/A;   right ring finger  1989   TANDEM RING INSERTION N/A 01/21/2019   Procedure: TANDEM RING INSERTION;  Surgeon: Gery Pray, MD;  Location: [facility];  Service: Urology;  Laterality: N/A;   TANDEM RING INSERTION N/A 01/25/2019   Procedure: TANDEM RING INSERTION;  Surgeon: Sondra Come,  Jeneen Rinks, MD;  Location: [facility];  Service: Urology;  Laterality: N/A;   TANDEM RING INSERTION  N/A 02/02/2019   Procedure: TANDEM RING INSERTION;  Surgeon: Gery Pray, MD;  Location: [facility];  Service: Urology;  Laterality: N/A;   TUBAL LIGATION      FAMILY HISTORY: family history includes Brain cancer in her father; Cancer in her father; Lung cancer in her father.  SOCIAL HISTORY:  reports that she has been smoking cigarettes. She has been smoking about 0.50 packs per day. She has never used smokeless tobacco. She reports current alcohol use. She reports current drug use. Drugs: Marijuana and Cocaine.  ALLERGIES: Patient has no known allergies.  MEDICATIONS:  Current Facility-Administered Medications  Medication Dose Route Frequency Provider Last Rate Last Dose   lactated ringers infusion   Intravenous Continuous Myrtie Soman, MD        REVIEW OF SYSTEMS:  A 10+ POINT REVIEW OF SYSTEMS WAS OBTAINED including neurology, dermatology, psychiatry, cardiac, respiratory, lymph, extremities, GI, GU, musculoskeletal, constitutional, reproductive, HEENT.  Vaginal bleeding has subsided.  Pelvic pain is improved but still present at this time.  PHYSICAL EXAM:  General: Alert and oriented, in no acute distress HEENT: Head is normocephalic. Extraocular movements are intact. Oropharynx is clear. Neck: Neck is supple, no palpable cervical or supraclavicular lymphadenopathy. Heart: Regular in rate and rhythm with no murmurs, rubs, or gallops. Chest: Clear to auscultation bilaterally, with no rhonchi, wheezes, or rales. Abdomen: Soft, nontender, nondistended, with no rigidity or guarding. Extremities: No cyanosis or edema. Lymphatics: see Neck Exam Skin: No concerning lesions. Musculoskeletal: symmetric strength and muscle tone throughout. Neurologic: Cranial nerves II through XII are grossly intact. No obvious focalities. Speech is fluent. Coordination is intact. Psychiatric: Judgment and insight are intact. Affect is appropriate. On pelvic examination the external  genitalia were unremarkable.  Limited pelvic exam in clinic last week revealed the cervical mass had decreased in size.  No active bleeding noted.  Exam was uncomfortable for the patient while awake.  More detailed exam will be performed under anesthesia.   ECOG = 1   LABORATORY DATA:  Lab Results  Component Value Date   WBC 2.3 (L) 02/10/2019   HGB 8.6 (L) 02/10/2019   HCT 26.4 (L) 02/10/2019   MCV 102.7 (H) 02/10/2019   PLT 101 (L) 02/10/2019   NEUTROABS 1.2 (L) 02/10/2019   Lab Results  Component Value Date   NA 136 01/18/2019   K 4.2 01/18/2019   CL 103 01/18/2019   CO2 24 01/18/2019   GLUCOSE 84 01/18/2019   CREATININE 0.84 01/18/2019   CALCIUM 9.2 01/18/2019         IMPRESSION: Stage IIIb Clinical Squamous Cell Carcinoma of the Cervix Patient is now ready to proceed with her 4th  intracavitary brachii therapy treatments to complete her definitive course of therapy.  Procedure explained to the patient she appears to understand well.   PLAN: Patient will be taken the operating room on February 11, 2019 for exam under anesthesia and placement of brachii therapy equipment in preparation for high-dose-rate radiation therapy with iridium 192 as the high-dose-rate source.  Plan is for the patient to receive 2 more high-dose-rate treatments directed at the cervical region.    ------------------------------------------------  Blair Promise, PhD, MD  This document serves as a record of services personally performed by Gery Pray, MD. It was created on his behalf by Wilburn Mylar, a trained medical scribe. The creation of this record is  based on the scribe's personal observations and the provider's statements to them. This document has been checked and approved by the attending provider.

## 2019-02-11 NOTE — Progress Notes (Signed)
  Radiation Oncology         (336) [phone] ________________________________  Name: Jennifer Santos MRN: [id number]  Date: 02/11/2019  DOB: December 01, 1965  CC: Marguerita Merles, MD  Nancy Marus, MD  HDR BRACHYTHERAPY NOTE  DIAGNOSIS: 53 y.o. female with Stage IIIC-2SquamousCellCarcinoma of theCervix   NARRATIVE: The patient was brought to the [facility] suite. Identity was confirmed. All relevant records and images related to the planned course of therapy were reviewed. The patient freely provided informed written consent to proceed with treatment after reviewing the details related to the planned course of therapy. The consent form was witnessed and verified by the simulation staff. Then, the patient was set-up in a stable reproducible supine position for radiation therapy. The tandem ring system was accessed and fiducial markers were placed within the tandem and ring.   Simple treatment device note: On the operating room the patient had construction of her custom tandem ring system. She will be treated with a 60 tandem/ring system. The patient had placement of a 40 mm tandem. A cervical ring with a small shielding was used for her treatment. A rectal paddle was also part of her custom set up device.  Verification simulation note: An AP and lateral film was obtained through the pelvis area. This was compared to the patient's planning films documenting accurate position of the tandem/ring system for treatment.  High-dose-rate brachytherapy treatment note:  The remote afterloading device was accessed through catheter system and attached to the tandem ring system. Patient then proceeded to undergo her fourth high-dose-rate treatment directed at the cervix. The patient was prescribed a dose of 5.5 gray to be delivered to the [place] Kitchen Patient was treated with 2 channels using 8+14=22 dwell positions. Treatment time was 267.40 seconds. The patient tolerated the procedure well. After completion of her therapy, a  radiation survey was performed documenting return of the iridium source into the [employer] safe. The patient was then transferred to the nursing suite. She then had removal of the rectal paddle followed by the tandem and ring system. The patient tolerated the removal well.  PLAN: The patient will return on 02/18/2019 for her fifth and final HDR treatment.  ________________________________  Blair Promise, PhD, MD  This document serves as a record of services personally performed by Gery Pray, MD. It was created on his behalf by Rae Lips, a trained medical scribe. The creation of this record is based on the scribe's personal observations and the provider's statements to them. This document has been checked and approved by the attending provider.

## 2019-02-11 NOTE — H&P (View-Only) (Signed)
°Radiation Oncology         [phone] °________________________________ ° °History and physical examination ° °Name: Jennifer Santos MRN: [id number]  °Date: 02/11/2019  DOB: 11/29/1965 ° ° °DIAGNOSIS: Stage IIIb Clinical Squamous Cell Carcinoma of the Cervix ° ° °HISTORY OF PRESENT ILLNESS::Jennifer Santos is a 53 y.o. female who  presented with postmenopausal bleeding. She reports it began approximately 9 months to a year ago as light bleeding every week. The bleeding increased, and she passed some very heavy clots about 9 weeks ago. It was at this point that she sought medical attention, and she subsequently met with Dr. Harris on 11/04/2018. He performed Pap smear and biopsy in the office that day. Pap smear revealed malignant cells, and the endometrium biopsy confirmed invasive moderately differentiated squamous cell carcinoma.  ° °She met with Dr. Gehrig on 11/11/2018. Per Dr. Gehrig's note, pelvic examination shows: external genitalia within normal limits; the vagina is well but supported but there was a large crater-like mass at the top of the vaginal apex; there is no normal cervix identifiable; on bimanual examination there is sidewall involvement of an 8 cm mass on the left side; the right sidewall is free but there is parametrial involvement on the right side but it does not extend to the sidewall, rectal confirms. ° °She underwent chest/abdomen/pelvis CT scans on 11/17/2018. These revealed: irregular enlargement of the cervix with extension of soft tissue from the cervix into the posterior presacral space along the side of the rectum, wall thickening in the upper vagina is compatible with tumor involvement, the endometrial cavity of the uterus is markedly expanded and fluid-filled consistent with obstruction, no definite involvement of the posterior bladder wall evident by CT; moderate right hydroureteronephrosis with ureteral dilatation extending down to the right pelvic sidewall adjacent to the cervix;  abnormal, amorphous soft tissue surrounds the distal aorta and common iliac arteries, imaging appearance concerning for tumor spread; no evidence for metastatic disease in the chest. ° °Patient has recently completed her course of external beam radiation therapy along with radiosensitizing chemotherapy.  She is now ready to proceed with intracavitary brachii therapy treatments to complete her definitive course of therapy. ° ° ° ° °PAST MEDICAL HISTORY:  has a past medical history of Acquired pancytopenia ([facility]), Cervical cancer, FIGO stage IIIB ([facility]) (oncologist-- dr gorsuch/  dr Taneika Choi), Compression fracture of T12 vertebra ([facility]), Fatigue, History of cancer chemotherapy (11-27-2018  to 01-13-2019), History of external beam radiation therapy (11-30-2018  to 01-11-2019), and Hydronephrosis, right.   ° °PAST SURGICAL HISTORY: °Past Surgical History:  °Procedure Laterality Date  °• BACK SURGERY  1985  ° Removal of superficial mass  °• IR IMAGING GUIDED PORT INSERTION  11/25/2018  °• OPERATIVE ULTRASOUND N/A 01/21/2019  ° Procedure: OPERATIVE ULTRASOUND;  Surgeon: Seward Coran, MD;  Location: [facility];  Service: Urology;  Laterality: N/A;  °• OPERATIVE ULTRASOUND N/A 01/25/2019  ° Procedure: OPERATIVE ULTRASOUND;  Surgeon: Noelani Harbach, MD;  Location: [facility];  Service: Urology;  Laterality: N/A;  °• OPERATIVE ULTRASOUND N/A 02/02/2019  ° Procedure: OPERATIVE ULTRASOUND;  Surgeon: Birtha Hatler, MD;  Location: [facility];  Service: Urology;  Laterality: N/A;  °• right ring finger  1989  °• TANDEM RING INSERTION N/A 01/21/2019  ° Procedure: TANDEM RING INSERTION;  Surgeon: Jahmari Esbenshade, MD;  Location: [facility];  Service: Urology;  Laterality: N/A;  °• TANDEM RING INSERTION N/A 01/25/2019  ° Procedure: TANDEM RING INSERTION;  Surgeon: Sarayah Bacchi,   Kellen Hover, MD;  Location: [facility];  Service: Urology;  Laterality: N/A;  °• TANDEM RING INSERTION  N/A 02/02/2019  ° Procedure: TANDEM RING INSERTION;  Surgeon: Karisha Marlin, MD;  Location: [facility];  Service: Urology;  Laterality: N/A;  °• TUBAL LIGATION    ° ° °FAMILY HISTORY: family history includes Brain cancer in her father; Cancer in her father; Lung cancer in her father. ° °SOCIAL HISTORY:  reports that she has been smoking cigarettes. She has been smoking about 0.50 packs per day. She has never used smokeless tobacco. She reports current alcohol use. She reports current drug use. Drugs: Marijuana and Cocaine. ° °ALLERGIES: Patient has no known allergies. ° °MEDICATIONS:  °Current Facility-Administered Medications  °Medication Dose Route Frequency Provider Last Rate Last Dose  °• lactated ringers infusion   Intravenous Continuous Rose, George, MD      ° ° °REVIEW OF SYSTEMS:  A 10+ POINT REVIEW OF SYSTEMS WAS OBTAINED including neurology, dermatology, psychiatry, cardiac, respiratory, lymph, extremities, GI, GU, musculoskeletal, constitutional, reproductive, HEENT.  Vaginal bleeding has subsided.  Pelvic pain is improved but still present at this time. ° °PHYSICAL EXAM:  °General: Alert and oriented, in no acute distress °HEENT: Head is normocephalic. Extraocular movements are intact. Oropharynx is clear. °Neck: Neck is supple, no palpable cervical or supraclavicular lymphadenopathy. °Heart: Regular in rate and rhythm with no murmurs, rubs, or gallops. °Chest: Clear to auscultation bilaterally, with no rhonchi, wheezes, or rales. °Abdomen: Soft, nontender, nondistended, with no rigidity or guarding. °Extremities: No cyanosis or edema. °Lymphatics: see Neck Exam °Skin: No concerning lesions. °Musculoskeletal: symmetric strength and muscle tone throughout. °Neurologic: Cranial nerves II through XII are grossly intact. No obvious focalities. Speech is fluent. Coordination is intact. °Psychiatric: Judgment and insight are intact. Affect is appropriate. °On pelvic examination the external  genitalia were unremarkable.  Limited pelvic exam in clinic last week revealed the cervical mass had decreased in size.  No active bleeding noted.  Exam was uncomfortable for the patient while awake.  More detailed exam will be performed under anesthesia. ° ° °ECOG = 1 ° ° °LABORATORY DATA:  °Lab Results  °Component Value Date  ° WBC 2.3 (L) 02/10/2019  ° HGB 8.6 (L) 02/10/2019  ° HCT 26.4 (L) 02/10/2019  ° MCV 102.7 (H) 02/10/2019  ° PLT 101 (L) 02/10/2019  ° NEUTROABS 1.2 (L) 02/10/2019  ° °Lab Results  °Component Value Date  ° NA 136 01/18/2019  ° K 4.2 01/18/2019  ° CL 103 01/18/2019  ° CO2 24 01/18/2019  ° GLUCOSE 84 01/18/2019  ° CREATININE 0.84 01/18/2019  ° CALCIUM 9.2 01/18/2019  ° ° °  °   °IMPRESSION: Stage IIIb Clinical Squamous Cell Carcinoma of the Cervix °Patient is now ready to proceed with her 4th  intracavitary brachii therapy treatments to complete her definitive course of therapy.  Procedure explained to the patient she appears to understand well. ° ° °PLAN: Patient will be taken the operating room on February 11, 2019 for exam under anesthesia and placement of brachii therapy equipment in preparation for high-dose-rate radiation therapy with iridium 192 as the high-dose-rate source.  Plan is for the patient to receive 2 more high-dose-rate treatments directed at the cervical region. ° °  °------------------------------------------------ ° °Margaretmary Prisk D. Eliyahu Bille, PhD, MD ° °This document serves as a record of services personally performed by Inaya Gillham, MD. It was created on his behalf by Katie Daubenspeck, a trained medical scribe. The creation of this record is   based on the scribe's personal observations and the provider's statements to them. This document has been checked and approved by the attending provider. ° ° °

## 2019-02-11 NOTE — Progress Notes (Signed)
  Radiation Oncology         [phone] ________________________________  Name: Jennifer Santos MRN: [id number]  Date: 02/11/2019  DOB: [birth year]  SIMULATION AND TREATMENT PLANNING NOTE HDR BRACHYTHERAPY  DIAGNOSIS:  53 y.o. female with Stage IIIC-2SquamousCellCarcinoma of theCervix  NARRATIVE:  The patient was brought to the [place].  Identity was confirmed.  All relevant records and images related to the planned course of therapy were reviewed.  The patient freely provided informed written consent to proceed with treatment after reviewing the details related to the planned course of therapy. The consent form was witnessed and verified by the simulation staff.  Then, the patient was set-up in a stable reproducible  supine position for radiation therapy.  CT images were obtained.  Surface markings were placed.  The CT images were loaded into the planning software.  Then the target and avoidance structures were contoured.  Treatment planning then occurred.  The radiation prescription was entered and confirmed.   I have requested : Brachytherapy Isodose Plan and Dosimetry Calculations to plan the radiation distribution.    PLAN:  The patient will receive 5.5 Gy in 1 fraction. Patient will be treated with iridium 192 as the high-dose-rate source.  The tandem ring system will be used to deliver the treatment.    ________________________________  Blair Promise, PhD, MD  This document serves as a record of services personally performed by Gery Pray, MD. It was created on his behalf by Rae Lips, a trained medical scribe. The creation of this record is based on the scribe's personal observations and the provider's statements to them. This document has been checked and approved by the attending provider.

## 2019-02-11 NOTE — Interval H&P Note (Signed)
History and Physical Interval Note:  02/11/2019 9:28 AM  Jennifer Santos  has presented today for surgery, with the diagnosis of EXOCERVIX.  The various methods of treatment have been discussed with the patient and family. After consideration of risks, benefits and other options for treatment, the patient has consented to  Procedure(s): TANDEM RING INSERTION (N/A) OPERATIVE ULTRASOUND (N/A) as a surgical intervention.  The patient's history has been reviewed, patient examined, no change in status, stable for surgery.  I have reviewed the patient's chart and labs.  Questions were answered to the patient's satisfaction.     Gery Pray

## 2019-02-11 NOTE — Transfer of Care (Signed)
Immediate Anesthesia Transfer of Care Note  Patient: Jennifer Santos  Procedure(s) Performed: Procedure(s) (LRB): TANDEM RING INSERTION (N/A) OPERATIVE ULTRASOUND (N/A)  Patient Location: PACU  Anesthesia Type: General  Level of Consciousness: awake, oriented, sedated and patient cooperative  Airway & Oxygen Therapy: Patient Spontanous Breathing and Patient connected to face mask oxygen  Post-op Assessment: Report given to PACU RN and Post -op Vital signs reviewed and stable  Post vital signs: Reviewed and stable  Complications: No apparent anesthesia complications Last Vitals:  Vitals Value Taken Time  BP 102/90 02/11/19 1115  Temp 37 C 02/11/19 1015  Pulse 69 02/11/19 1120  Resp 12 02/11/19 1120  SpO2 100 % 02/11/19 1120  Vitals shown include unvalidated device data.  Last Pain:  Vitals:   02/11/19 1045  TempSrc:   PainSc: 0-No pain      Patients Stated Pain Goal: 7 (02/11/19 0753)

## 2019-02-14 ENCOUNTER — Other Ambulatory Visit (HOSPITAL_COMMUNITY): Payer: Self-pay | Admitting: Radiation Oncology

## 2019-02-14 DIAGNOSIS — C531 Malignant neoplasm of exocervix: Secondary | ICD-10-CM

## 2019-02-15 ENCOUNTER — Encounter (HOSPITAL_BASED_OUTPATIENT_CLINIC_OR_DEPARTMENT_OTHER): Payer: Self-pay | Admitting: Radiation Oncology

## 2019-02-17 ENCOUNTER — Other Ambulatory Visit: Payer: Self-pay

## 2019-02-17 ENCOUNTER — Inpatient Hospital Stay: Payer: BLUE CROSS/BLUE SHIELD

## 2019-02-17 ENCOUNTER — Encounter (HOSPITAL_BASED_OUTPATIENT_CLINIC_OR_DEPARTMENT_OTHER): Payer: Self-pay | Admitting: *Deleted

## 2019-02-17 ENCOUNTER — Telehealth: Payer: Self-pay | Admitting: *Deleted

## 2019-02-17 DIAGNOSIS — C531 Malignant neoplasm of exocervix: Secondary | ICD-10-CM

## 2019-02-17 DIAGNOSIS — D61818 Other pancytopenia: Secondary | ICD-10-CM

## 2019-02-17 LAB — CBC WITH DIFFERENTIAL/PLATELET
Abs Immature Granulocytes: 0.02 10*3/uL (ref 0.00–0.07)
Basophils Absolute: 0 10*3/uL (ref 0.0–0.1)
Basophils Relative: 0 %
Eosinophils Absolute: 0 10*3/uL (ref 0.0–0.5)
Eosinophils Relative: 1 %
HCT: 32.5 % — ABNORMAL LOW (ref 36.0–46.0)
Hemoglobin: 10.6 g/dL — ABNORMAL LOW (ref 12.0–15.0)
Immature Granulocytes: 1 %
Lymphocytes Relative: 20 %
Lymphs Abs: 0.7 10*3/uL (ref 0.7–4.0)
MCH: 34.2 pg — ABNORMAL HIGH (ref 26.0–34.0)
MCHC: 32.6 g/dL (ref 30.0–36.0)
MCV: 104.8 fL — ABNORMAL HIGH (ref 80.0–100.0)
Monocytes Absolute: 0.3 10*3/uL (ref 0.1–1.0)
Monocytes Relative: 9 %
Neutro Abs: 2.3 10*3/uL (ref 1.7–7.7)
Neutrophils Relative %: 69 %
Platelets: 171 10*3/uL (ref 150–400)
RBC: 3.1 MIL/uL — ABNORMAL LOW (ref 3.87–5.11)
RDW: 16.6 % — ABNORMAL HIGH (ref 11.5–15.5)
WBC: 3.4 10*3/uL — ABNORMAL LOW (ref 4.0–10.5)
nRBC: 0 % (ref 0.0–0.2)

## 2019-02-17 LAB — SAMPLE TO BLOOD BANK

## 2019-02-17 MED ORDER — HYDROMORPHONE HCL 1 MG/ML IJ SOLN
0.2500 mg | INTRAMUSCULAR | Status: DC | PRN
  Filled 2019-02-17: qty 0.5

## 2019-02-17 MED ORDER — ACETAMINOPHEN 325 MG PO TABS
325.0000 mg | ORAL_TABLET | Freq: Once | ORAL | Status: DC | PRN
  Filled 2019-02-17: qty 2

## 2019-02-17 MED ORDER — OXYCODONE HCL 5 MG PO TABS
5.0000 mg | ORAL_TABLET | Freq: Once | ORAL | Status: DC | PRN
  Filled 2019-02-17: qty 1

## 2019-02-17 MED ORDER — LACTATED RINGERS IV SOLN
INTRAVENOUS | Status: DC
  Administered 2019-02-18: 09:00:00 1000 mL via INTRAVENOUS
  Filled 2019-02-17: qty 1000

## 2019-02-17 MED ORDER — PROMETHAZINE HCL 25 MG/ML IJ SOLN
6.2500 mg | INTRAMUSCULAR | Status: DC | PRN
  Filled 2019-02-17: qty 1

## 2019-02-17 MED ORDER — ACETAMINOPHEN 10 MG/ML IV SOLN
1000.0000 mg | Freq: Once | INTRAVENOUS | Status: AC | PRN
  Filled 2019-02-17: qty 100

## 2019-02-17 MED ORDER — OXYCODONE HCL 5 MG/5ML PO SOLN
5.0000 mg | Freq: Once | ORAL | Status: DC | PRN
  Filled 2019-02-17: qty 5

## 2019-02-17 MED ORDER — MEPERIDINE HCL 25 MG/ML IJ SOLN
6.2500 mg | INTRAMUSCULAR | Status: DC | PRN
  Filled 2019-02-17: qty 1

## 2019-02-17 MED ORDER — ACETAMINOPHEN 160 MG/5ML PO SOLN
325.0000 mg | Freq: Once | ORAL | Status: DC | PRN
  Filled 2019-02-17: qty 20.3

## 2019-02-17 NOTE — Telephone Encounter (Signed)
CALLED PATIENT TO INFORM THAT SHE NEEDS TO CALL PRE-SURGICAL TESTING, LVM FOR A RETURN CALL

## 2019-02-17 NOTE — Progress Notes (Signed)
Spoke w/ pt via phone for pre-op interview.  Npo after mn.  Arrive at 0730.  Current lab result in epic.  Pt stated she has stay self quarantined at home since last week after procedure and denies any S&S of covid, No covid  test needed.

## 2019-02-18 ENCOUNTER — Encounter (HOSPITAL_BASED_OUTPATIENT_CLINIC_OR_DEPARTMENT_OTHER)
Admission: RE | Disposition: A | Discharge status: Other Acute Inpatient Hospital | Payer: Self-pay | Source: Home / Self Care | Attending: Radiation Oncology

## 2019-02-18 ENCOUNTER — Encounter: Payer: Self-pay | Admitting: Radiation Oncology

## 2019-02-18 ENCOUNTER — Ambulatory Visit (HOSPITAL_BASED_OUTPATIENT_CLINIC_OR_DEPARTMENT_OTHER)
Admission: RE | Admit: 2019-02-18 | Discharge: 2019-02-18 | Disposition: A | Discharge status: Other Acute Inpatient Hospital | Payer: BLUE CROSS/BLUE SHIELD | Attending: Radiation Oncology | Admitting: Radiation Oncology

## 2019-02-18 ENCOUNTER — Encounter (HOSPITAL_BASED_OUTPATIENT_CLINIC_OR_DEPARTMENT_OTHER): Payer: Self-pay

## 2019-02-18 ENCOUNTER — Ambulatory Visit (HOSPITAL_BASED_OUTPATIENT_CLINIC_OR_DEPARTMENT_OTHER): Payer: BLUE CROSS/BLUE SHIELD | Admitting: Anesthesiology

## 2019-02-18 ENCOUNTER — Ambulatory Visit
Admission: RE | Admit: 2019-02-18 | Discharge: 2019-02-18 | Disposition: A | Discharge status: Home / Self Care | Payer: BLUE CROSS/BLUE SHIELD | Source: Ambulatory Visit | Attending: Radiation Oncology | Admitting: Radiation Oncology

## 2019-02-18 ENCOUNTER — Ambulatory Visit (HOSPITAL_COMMUNITY)
Admission: RE | Admit: 2019-02-18 | Discharge: 2019-02-18 | Disposition: A | Discharge status: Home / Self Care | Payer: BLUE CROSS/BLUE SHIELD | Source: Ambulatory Visit | Attending: Radiation Oncology | Admitting: Radiation Oncology

## 2019-02-18 ENCOUNTER — Other Ambulatory Visit: Payer: Self-pay

## 2019-02-18 VITALS — BP 107/67 | HR 60 | Temp 97.8°F | Resp 18

## 2019-02-18 DIAGNOSIS — C531 Malignant neoplasm of exocervix: Secondary | ICD-10-CM | POA: Diagnosis present

## 2019-02-18 DIAGNOSIS — Z808 Family history of malignant neoplasm of other organs or systems: Secondary | ICD-10-CM | POA: Insufficient documentation

## 2019-02-18 DIAGNOSIS — Z923 Personal history of irradiation: Secondary | ICD-10-CM | POA: Insufficient documentation

## 2019-02-18 DIAGNOSIS — F17210 Nicotine dependence, cigarettes, uncomplicated: Secondary | ICD-10-CM | POA: Insufficient documentation

## 2019-02-18 DIAGNOSIS — Z801 Family history of malignant neoplasm of trachea, bronchus and lung: Secondary | ICD-10-CM | POA: Diagnosis not present

## 2019-02-18 DIAGNOSIS — N950 Postmenopausal bleeding: Secondary | ICD-10-CM | POA: Diagnosis not present

## 2019-02-18 DIAGNOSIS — C539 Malignant neoplasm of cervix uteri, unspecified: Secondary | ICD-10-CM

## 2019-02-18 DIAGNOSIS — C530 Malignant neoplasm of endocervix: Secondary | ICD-10-CM

## 2019-02-18 DIAGNOSIS — Z9221 Personal history of antineoplastic chemotherapy: Secondary | ICD-10-CM | POA: Diagnosis not present

## 2019-02-18 HISTORY — PX: OPERATIVE ULTRASOUND: SHX5996

## 2019-02-18 HISTORY — PX: TANDEM RING INSERTION: SHX6199

## 2019-02-18 SURGERY — INSERTION, UTERINE TANDEM AND RING OR CYLINDER, FOR BRACHYTHERAPY
Anesthesia: General | Site: Uterus

## 2019-02-18 MED ORDER — LIDOCAINE 2% (20 MG/ML) 5 ML SYRINGE
INTRAMUSCULAR | Status: AC
  Filled 2019-02-18: qty 5

## 2019-02-18 MED ORDER — FENTANYL CITRATE (PF) 100 MCG/2ML IJ SOLN
INTRAMUSCULAR | Status: AC
  Filled 2019-02-18: qty 2

## 2019-02-18 MED ORDER — LIDOCAINE HCL (CARDIAC) PF 100 MG/5ML IV SOSY
PREFILLED_SYRINGE | INTRAVENOUS | Status: DC | PRN
  Administered 2019-02-18: 100 mg via INTRAVENOUS

## 2019-02-18 MED ORDER — DEXAMETHASONE SODIUM PHOSPHATE 4 MG/ML IJ SOLN
INTRAMUSCULAR | Status: DC | PRN
  Administered 2019-02-18: 10 mg via INTRAVENOUS

## 2019-02-18 MED ORDER — MIDAZOLAM HCL 2 MG/2ML IJ SOLN
INTRAMUSCULAR | Status: AC
  Filled 2019-02-18: qty 2

## 2019-02-18 MED ORDER — ONDANSETRON HCL 4 MG/2ML IJ SOLN
INTRAMUSCULAR | Status: AC
  Filled 2019-02-18: qty 2

## 2019-02-18 MED ORDER — DEXAMETHASONE SODIUM PHOSPHATE 10 MG/ML IJ SOLN
INTRAMUSCULAR | Status: AC
  Filled 2019-02-18: qty 1

## 2019-02-18 MED ORDER — HYDROMORPHONE HCL 1 MG/ML IJ SOLN
0.5000 mg | Freq: Once | INTRAMUSCULAR | Status: AC
  Administered 2019-02-18: 0.5 mg via INTRAVENOUS
  Filled 2019-02-18: qty 1

## 2019-02-18 MED ORDER — FENTANYL CITRATE (PF) 100 MCG/2ML IJ SOLN
INTRAMUSCULAR | Status: DC | PRN
  Administered 2019-02-18 (×4): 25 ug via INTRAVENOUS

## 2019-02-18 MED ORDER — MIDAZOLAM HCL 5 MG/5ML IJ SOLN
INTRAMUSCULAR | Status: DC | PRN
  Administered 2019-02-18: 2 mg via INTRAVENOUS

## 2019-02-18 MED ORDER — KETOROLAC TROMETHAMINE 30 MG/ML IJ SOLN
INTRAMUSCULAR | Status: DC | PRN
  Administered 2019-02-18: 30 mg via INTRAVENOUS

## 2019-02-18 MED ORDER — PROPOFOL 10 MG/ML IV BOLUS
INTRAVENOUS | Status: DC | PRN
  Administered 2019-02-18: 150 mg via INTRAVENOUS
  Administered 2019-02-18: 50 mg via INTRAVENOUS

## 2019-02-18 MED ORDER — PROPOFOL 10 MG/ML IV BOLUS
INTRAVENOUS | Status: AC
  Filled 2019-02-18: qty 40

## 2019-02-18 MED ORDER — LACTATED RINGERS IV SOLN
INTRAVENOUS | Status: DC
  Administered 2019-02-18: 12:00:00 via INTRAVENOUS
  Filled 2019-02-18 (×2): qty 250

## 2019-02-18 MED ORDER — SODIUM CHLORIDE 0.9 % IR SOLN
Status: DC | PRN
  Administered 2019-02-18: 1000 mL via INTRAVESICAL

## 2019-02-18 MED ORDER — KETOROLAC TROMETHAMINE 30 MG/ML IJ SOLN
INTRAMUSCULAR | Status: AC
  Filled 2019-02-18: qty 1

## 2019-02-18 MED ORDER — ONDANSETRON HCL 4 MG/2ML IJ SOLN
INTRAMUSCULAR | Status: DC | PRN
  Administered 2019-02-18: 4 mg via INTRAVENOUS

## 2019-02-18 SURGICAL SUPPLY — 19 items
BNDG CONFORM 2 STRL LF (GAUZE/BANDAGES/DRESSINGS) IMPLANT
COVER WAND RF STERILE (DRAPES) ×3 IMPLANT
DILATOR CANAL MILEX (MISCELLANEOUS) IMPLANT
GAUZE 4X4 16PLY RFD (DISPOSABLE) ×3 IMPLANT
GLOVE BIO SURGEON STRL SZ7.5 (GLOVE) ×6 IMPLANT
GOWN STRL REUS W/TWL LRG LVL3 (GOWN DISPOSABLE) ×3 IMPLANT
HOLDER FOLEY CATH W/STRAP (MISCELLANEOUS) ×3 IMPLANT
HOVERMATT SINGLE USE (MISCELLANEOUS) ×3 IMPLANT
IV NS 1000ML (IV SOLUTION) ×2
IV NS 1000ML BAXH (IV SOLUTION) ×1 IMPLANT
IV SET EXTENSION GRAVITY 40 LF (IV SETS) ×3 IMPLANT
KIT TURNOVER CYSTO (KITS) ×3 IMPLANT
PACK VAGINAL MINOR WOMEN LF (CUSTOM PROCEDURE TRAY) ×3 IMPLANT
PACKING VAGINAL (PACKING) IMPLANT
PAD ABD 8X10 STRL (GAUZE/BANDAGES/DRESSINGS) ×3 IMPLANT
PAD OB MATERNITY 4.3X12.25 (PERSONAL CARE ITEMS) IMPLANT
TOWEL OR 17X26 10 PK STRL BLUE (TOWEL DISPOSABLE) ×3 IMPLANT
TRAY FOLEY W/BAG SLVR 14FR (SET/KITS/TRAYS/PACK) ×3 IMPLANT
WATER STERILE IRR 500ML POUR (IV SOLUTION) ×3 IMPLANT

## 2019-02-18 NOTE — Interval H&P Note (Signed)
History and Physical Interval Note:  02/18/2019 9:20 AM  Gardner Candle  has presented today for surgery, with the diagnosis of EXOCERVIX.  The various methods of treatment have been discussed with the patient and family. After consideration of risks, benefits and other options for treatment, the patient has consented to  Procedure(s): TANDEM RING INSERTION (N/A) OPERATIVE ULTRASOUND (N/A) as a surgical intervention.  The patient's history has been reviewed, patient examined, no change in status, stable for surgery.  I have reviewed the patient's chart and labs.  Questions were answered to the patient's satisfaction.     Gery Pray

## 2019-02-18 NOTE — Anesthesia Preprocedure Evaluation (Signed)
Anesthesia Evaluation  Patient identified by MRN, date of birth, ID band Patient awake    Reviewed: Allergy & Precautions, NPO status , Patient's Chart, lab work & pertinent test results  Airway Mallampati: I  TM Distance: >3 FB Neck ROM: Full    Dental  (+) Teeth Intact, Dental Advisory Given   Pulmonary Current Smoker,    breath sounds clear to auscultation       Cardiovascular negative cardio ROS   Rhythm:Regular Rate:Normal     Neuro/Psych negative neurological ROS  negative psych ROS   GI/Hepatic negative GI ROS, Neg liver ROS,   Endo/Other  negative endocrine ROS  Renal/GU      Musculoskeletal negative musculoskeletal ROS (+)   Abdominal Normal abdominal exam  (+)   Peds  Hematology negative hematology ROS (+)   Anesthesia Other Findings   Reproductive/Obstetrics                             Anesthesia Physical  Anesthesia Plan  ASA: II  Anesthesia Plan: General   Post-op Pain Management:    Induction: Intravenous  PONV Risk Score and Plan: 3 and Ondansetron, Dexamethasone and Midazolam  Airway Management Planned: LMA  Additional Equipment: None  Intra-op Plan:   Post-operative Plan: Extubation in OR  Informed Consent: I have reviewed the patients History and Physical, chart, labs and discussed the procedure including the risks, benefits and alternatives for the proposed anesthesia with the patient or authorized representative who has indicated his/her understanding and acceptance.     Dental advisory given  Plan Discussed with: CRNA  Anesthesia Plan Comments:         Anesthesia Quick Evaluation

## 2019-02-18 NOTE — Addendum Note (Signed)
Encounter addended by: Loma Sousa, RN on: 02/18/2019 1:54 PM  Actions taken: Order list changed, Diagnosis association updated

## 2019-02-18 NOTE — Addendum Note (Signed)
Encounter addended by: Loma Sousa, RN on: 02/18/2019 3:34 PM  Actions taken: Flowsheet accepted

## 2019-02-18 NOTE — Anesthesia Postprocedure Evaluation (Signed)
Anesthesia Post Note  Patient: [place] Lingenfelter  Procedure(s) Performed: TANDEM RING INSERTION (N/A Uterus) OPERATIVE ULTRASOUND (N/A Uterus)     Patient location during evaluation: PACU Anesthesia Type: General Level of consciousness: awake and alert Pain management: pain level controlled Vital Signs Assessment: post-procedure vital signs reviewed and stable Respiratory status: spontaneous breathing, nonlabored ventilation, respiratory function stable and patient connected to nasal cannula oxygen Cardiovascular status: blood pressure returned to baseline and stable Postop Assessment: no apparent nausea or vomiting Anesthetic complications: no    Last Vitals:  Vitals:   02/18/19 1030 02/18/19 1045  BP: 113/79 108/64  Pulse: 70 63  Resp: 14 17  Temp:    SpO2: 100% 100%    Last Pain:  Vitals:   02/18/19 1045  TempSrc:   PainSc: 0-No pain                 Kule Gascoigne

## 2019-02-18 NOTE — Addendum Note (Signed)
Encounter addended by: Loma Sousa, RN on: 02/18/2019 1:59 PM  Actions taken: MAR administration accepted

## 2019-02-18 NOTE — Progress Notes (Signed)
  Radiation Oncology         [phone] ________________________________  Name: Jennifer Santos MRN: [id number]  Date: 02/18/2019  DOB: [birth year]  SIMULATION AND TREATMENT PLANNING NOTE HDR BRACHYTHERAPY  DIAGNOSIS:  53 y.o. female with Stage IIIC-2SquamousCellCarcinoma of theCervix  NARRATIVE:  The patient was brought to the [facility].  Identity was confirmed.  All relevant records and images related to the planned course of therapy were reviewed.  The patient freely provided informed written consent to proceed with treatment after reviewing the details related to the planned course of therapy. The consent form was witnessed and verified by the simulation staff.  Then, the patient was set-up in a stable reproducible  supine position for radiation therapy.  CT images were obtained.  Surface markings were placed.  The CT images were loaded into the planning software.  Then the target and avoidance structures were contoured.  Treatment planning then occurred.  The radiation prescription was entered and confirmed.   I have requested : Brachytherapy Isodose Plan and Dosimetry Calculations to plan the radiation distribution.    PLAN:  The patient will receive 5.5 Gy in 1 fraction. Patient will be treated with iridium 192 as the high-dose-rate source. The tandem ring system will be used to deliver the treatment.    ________________________________  Blair Promise, PhD, MD  This document serves as a record of services personally performed by Gery Pray, MD. It was created on his behalf by Rae Lips, a trained medical scribe. The creation of this record is based on the scribe's personal observations and the provider's statements to them. This document has been checked and approved by the attending provider.

## 2019-02-18 NOTE — Transfer of Care (Signed)
Immediate Anesthesia Transfer of Care Note  Patient: Jennifer Santos  Procedure(s) Performed: Procedure(s) (LRB): TANDEM RING INSERTION (N/A) OPERATIVE ULTRASOUND (N/A)  Patient Location: PACU  Anesthesia Type: General  Level of Consciousness: awake, sedated, patient cooperative and responds to stimulation  Airway & Oxygen Therapy: Patient Spontanous Breathing and Patient connected to [place] O2 and soft face mask   Post-op Assessment: Report given to PACU RN, Post -op Vital signs reviewed and stable and Patient moving all extremities  Post vital signs: Reviewed and stable  Complications: No apparent anesthesia complications

## 2019-02-18 NOTE — Anesthesia Procedure Notes (Signed)
Procedure Name: LMA Insertion Date/Time: 02/18/2019 9:38 AM Performed by: Justice Rocher, CRNA Pre-anesthesia Checklist: Patient identified, Emergency Drugs available, Suction available and Patient being monitored Patient Re-evaluated:Patient Re-evaluated prior to induction Oxygen Delivery Method: Circle system utilized Preoxygenation: Pre-oxygenation with 100% oxygen Induction Type: IV induction Ventilation: Mask ventilation without difficulty LMA: LMA inserted LMA Size: 4.0 Number of attempts: 1 Airway Equipment and Method: Bite block Placement Confirmation: positive ETCO2 and breath sounds checked- equal and bilateral Tube secured with: Tape Dental Injury: Teeth and Oropharynx as per pre-operative assessment

## 2019-02-18 NOTE — Progress Notes (Signed)
Pt rested here in nursing in Radiation Oncology between CT simulation and HDR treatment. Pt tolerated procedure and removal of device well. VSS. PO intake without difficulty. Pt was discharged to family/personal vehicle with all belongings in no apparent distress via WC. Loma Sousa, RN BSN

## 2019-02-18 NOTE — Op Note (Signed)
02/18/2019  10:40 AM  PATIENT:  Jennifer Santos  53 y.o. female  PRE-OPERATIVE DIAGNOSIS:  EXOCERVIX  POST-OPERATIVE DIAGNOSIS:  EXOCERVIX  PROCEDURE:  Procedure(s): TANDEM RING INSERTION (N/A) OPERATIVE ULTRASOUND (N/A)  SURGEON:  Surgeon(s) and Role:    * Gery Pray, MD - Primary  PHYSICIAN ASSISTANT:   ASSISTANTS: none   ANESTHESIA:   general  EBL:  0 mL   BLOOD ADMINISTERED:none  DRAINS: Urinary Catheter (Foley)   LOCAL MEDICATIONS USED:  NONE  SPECIMEN:  No Specimen  DISPOSITION OF SPECIMEN:  N/A  COUNTS:  YES  TOURNIQUET:  * No tourniquets in log *  DICTATION: Patient was taken to the outpatient OR #1. Patient was prepped and draped in usual sterile fashion and placed in the dorsolithotomy position. Timeout was performed for preoperative medications,procedure aswell as patient identification. The patient proceeded to undergo placement of a Foley catheter without difficulty. The bladder was backfilled with approximately 200 cc sterile water. Patient proceeded to undergo exam under anesthesia the cervical fornices are absent due topriorextensive tumor.   The size of the cervical lesion was difficult to determine given the significantly altered anatomy.  The patient initially had poor ultrasound image quality but then with manipulationand changing of the probe,the images improved. The uterus noted to be minimally anteverted.  The 40 mm cervical sleeve was placed without difficulty.  Prior to this the patient has dilation of the cervical os. The patient had placement of a 40 mm 60 degree tandem within the endocervical and endometrial canal. Images showed good placement centrally within the endometrial canal. Patient then had placement of a ring with a small shielding capIn place. This was then followed by placement of a rectal paddle. There is no room for vaginal packing present. Ultrasound images at the completion of the procedure showed good placement  of the tandem within the endocervical canal and endometrial cavity. Patient tolerated the procedure well. She was subsequently transferred to the recovery room in stable condition. Later in the day the patient will be taken down to the radiation oncology for planning and CT scan imaging followed by herfifth and finalhigh-dose-rate brachii therapy treatment. The patient will be treated with iridium 192 is the high-dose-rate source. She is planning to have 5 high-dose-rate procedures.  PLAN OF CARE: Transferred to radiation oncology for planning and treatment  PATIENT DISPOSITION:  PACU - hemodynamically stable.   Delay start of Pharmacological VTE agent (>24hrs) due to surgical blood loss or risk of bleeding: not applicable

## 2019-02-18 NOTE — Addendum Note (Signed)
Encounter addended by: Loma Sousa, RN on: 02/18/2019 3:49 PM  Actions taken: Clinical Note Signed

## 2019-02-18 NOTE — Progress Notes (Signed)
  Radiation Oncology         [phone] ________________________________  Name: Jennifer Santos MRN: [id number]  Date: 02/18/2019  DOB: 07/15/1966  CC: Marguerita Merles, MD  Nancy Marus, MD  HDR BRACHYTHERAPY NOTE  DIAGNOSIS: 53 y.o. female with Stage IIIC-2SquamousCellCarcinoma of theCervix   NARRATIVE: The patient was brought to the [facility] suite. Identity was confirmed. All relevant records and images related to the planned course of therapy were reviewed. The patient freely provided informed written consent to proceed with treatment after reviewing the details related to the planned course of therapy. The consent form was witnessed and verified by the simulation staff. Then, the patient was set-up in a stable reproducible supine position for radiation therapy. The tandem ring system was accessed and fiducial markers were placed within the tandem and ring.   Simple treatment device note: On the operating room the patient had construction of her custom tandem ring system. She will be treated with a 60 tandem/ring system. The patient had placement of a 40 mm tandem. A cervical ring with a small shielding was used for her treatment. A rectal paddle was also part of her custom set up device.  Verification simulation note: An AP and lateral film was obtained through the pelvis area. This was compared to the patient's planning films documenting accurate position of the tandem/ring system for treatment.  High-dose-rate brachytherapy treatment note:  The remote afterloading device was accessed through catheter system and attached to the tandem ring system. Patient then proceeded to undergo her fifth and final high-dose-rate treatment directed at the cervix. The patient was prescribed a dose of 5.5 gray to be delivered to the [place].Marland Kitchen Patient was treated with 2 channels using 22 dwell positions. Treatment time was 254.6 seconds. The patient tolerated the procedure well. After completion of her  therapy, a radiation survey was performed documenting return of the iridium source into the [employer] safe. The patient was then transferred to the nursing suite. She then had removal of the rectal paddle followed by the tandem and ring system. The patient tolerated the removal well.  PLAN: The patient has completed HDR treatment and will return to radiation oncology clinic for routine followup in one month. ________________________________  Blair Promise, PhD, MD  This document serves as a record of services personally performed by Gery Pray, MD. It was created on his behalf by Rae Lips, a trained medical scribe. The creation of this record is based on the scribe's personal observations and the provider's statements to them. This document has been checked and approved by the attending provider.

## 2019-02-19 ENCOUNTER — Encounter (HOSPITAL_BASED_OUTPATIENT_CLINIC_OR_DEPARTMENT_OTHER): Payer: Self-pay | Admitting: Radiation Oncology

## 2019-02-26 ENCOUNTER — Other Ambulatory Visit: Payer: Self-pay

## 2019-02-26 DIAGNOSIS — C530 Malignant neoplasm of endocervix: Secondary | ICD-10-CM

## 2019-03-01 ENCOUNTER — Inpatient Hospital Stay: Payer: BLUE CROSS/BLUE SHIELD

## 2019-03-01 ENCOUNTER — Other Ambulatory Visit: Payer: Self-pay

## 2019-03-01 DIAGNOSIS — C531 Malignant neoplasm of exocervix: Secondary | ICD-10-CM

## 2019-03-01 DIAGNOSIS — C530 Malignant neoplasm of endocervix: Secondary | ICD-10-CM

## 2019-03-01 LAB — CBC WITH DIFFERENTIAL/PLATELET
Abs Immature Granulocytes: 0.01 10*3/uL (ref 0.00–0.07)
Basophils Absolute: 0 10*3/uL (ref 0.0–0.1)
Basophils Relative: 0 %
Eosinophils Absolute: 0 10*3/uL (ref 0.0–0.5)
Eosinophils Relative: 1 %
HCT: 29.7 % — ABNORMAL LOW (ref 36.0–46.0)
Hemoglobin: 9.7 g/dL — ABNORMAL LOW (ref 12.0–15.0)
Immature Granulocytes: 0 %
Lymphocytes Relative: 14 %
Lymphs Abs: 0.5 10*3/uL — ABNORMAL LOW (ref 0.7–4.0)
MCH: 33.7 pg (ref 26.0–34.0)
MCHC: 32.7 g/dL (ref 30.0–36.0)
MCV: 103.1 fL — ABNORMAL HIGH (ref 80.0–100.0)
Monocytes Absolute: 0.5 10*3/uL (ref 0.1–1.0)
Monocytes Relative: 13 %
Neutro Abs: 2.8 10*3/uL (ref 1.7–7.7)
Neutrophils Relative %: 72 %
Platelets: 189 10*3/uL (ref 150–400)
RBC: 2.88 MIL/uL — ABNORMAL LOW (ref 3.87–5.11)
RDW: 13.5 % (ref 11.5–15.5)
WBC: 3.8 10*3/uL — ABNORMAL LOW (ref 4.0–10.5)
nRBC: 0 % (ref 0.0–0.2)

## 2019-03-01 LAB — SAMPLE TO BLOOD BANK

## 2019-03-01 MED ORDER — SODIUM CHLORIDE 0.9% FLUSH
10.0000 mL | Freq: Once | INTRAVENOUS | Status: AC
  Administered 2019-03-01: 10 mL
  Filled 2019-03-01: qty 10

## 2019-03-01 MED ORDER — HEPARIN SOD (PORK) LOCK FLUSH 100 UNIT/ML IV SOLN
500.0000 [IU] | Freq: Once | INTRAVENOUS | Status: AC
  Administered 2019-03-01: 500 [IU]
  Filled 2019-03-01: qty 5

## 2019-03-22 ENCOUNTER — Ambulatory Visit
Admission: RE | Admit: 2019-03-22 | Discharge: 2019-03-22 | Disposition: A | Discharge status: Home / Self Care | Payer: BLUE CROSS/BLUE SHIELD | Source: Ambulatory Visit | Attending: Radiation Oncology | Admitting: Radiation Oncology

## 2019-03-22 ENCOUNTER — Encounter: Payer: Self-pay | Admitting: Radiation Oncology

## 2019-03-22 VITALS — BP 116/40 | HR 99 | Temp 98.2°F | Resp 16 | Wt 114.2 lb

## 2019-03-22 DIAGNOSIS — R1011 Right upper quadrant pain: Secondary | ICD-10-CM | POA: Diagnosis not present

## 2019-03-22 DIAGNOSIS — C531 Malignant neoplasm of exocervix: Secondary | ICD-10-CM | POA: Diagnosis not present

## 2019-03-22 DIAGNOSIS — Z923 Personal history of irradiation: Secondary | ICD-10-CM | POA: Insufficient documentation

## 2019-03-22 DIAGNOSIS — C530 Malignant neoplasm of endocervix: Secondary | ICD-10-CM

## 2019-03-22 NOTE — Progress Notes (Signed)
  Radiation Oncology         (336) [phone] ________________________________  Name: Jennifer Santos MRN: [id number]  Date: 02/18/2019  DOB: [birth year]  End of Treatment Note  Diagnosis:   53 y.o. female with Stage IIIC-2SquamousCellCarcinoma of theCervix     Indication for treatment:  Curative       Radiation treatment dates:    1. 11/30/2018 - 01/11/2019 2. 6/18, 6/22, 6/30, 7/9, 02/18/2019  Site/dose:    1. (a) Pelvis/Paraaortic / 45 Gy in 25 fractions     (b) Pelvis, Boost / 9 Gy in 5 fractions 2. Cervix, Tandem/Ring System, 60, 40 mm / 27.5 Gy in 5 fractions  Beams/energy:    1. (a) IMRT, photons / 6X     (b) Complex Isodose, photons / 15X, 6X 2. Brachytherapy, [place] / Iridium-192  Narrative: The patient tolerated radiation treatment relatively well. In regards to her external beam treatments: she reported loss of appetite, moderate to severe fatigue, diarrhea and constipation, occasional abdominal cramps, straining to urinate, and low back pain throughout treatment. She also reported some skin burning, vaginal discharge (starting as green and turning to scant yellow), vaginal bleeding, and nausea with vomiting, all of which resolved by the end of treatments.   Physical exam revealed significant decrease in the size of the mass towards the end of treatments.  In regards to her HDR, she tolerated each procedure well with no acute side effects noted.  Plan: The patient has completed radiation treatment. The patient will return to radiation oncology clinic for routine followup in one month. I advised them to call or return sooner if they have any questions or concerns related to their recovery or treatment.  -----------------------------------  Blair Promise, PhD, MD  This document serves as a record of services personally performed by Gery Pray, MD. It was created on his behalf by Wilburn Mylar, a trained medical scribe. The creation of this record is based on the scribe's  personal observations and the provider's statements to them. This document has been checked and approved by the attending provider.

## 2019-03-22 NOTE — Patient Instructions (Signed)
Home Care Instructions for the Insertion and Care of Your Vaginal Dilator  Why Do I Need a Vaginal Dilator?  Internal radiation therapy may cause scar tissue to form at the top of your vagina (vaginal cuff).  This may make vaginal examinations difficult in the future. You can prevent scar tissue from forming by using a vaginal dilator (a smooth plastic rod), and/or by having regular sexual intercourse.  If not using the dilator you should be having intercourse two or three times a week.  If you are unable to have intercourse, you should use your vaginal dilator.  You may have some spotting or bleeding from your dilator or intercourse the first few times. You may also have some discomfort. If discomfort occurs with intercourse, you and your partner may need to stop for a while and try again later.  How to Use Your Vaginal Dilator  - Wash the dilator with soap and water before and after each use. - Check the dilator to be sure it is smooth. Do not use the dilator if you find any roughspots. - Coat the dilator with K-Y Jelly, Astroglide, or Replens. Do not use Vaseline, baby oil, or other oil based lubricants. They are not water-soluble and can be irritating to the tissues in the vagina. - Lie on your back with your knees bent and legs apart. - Insert the rounded end of the dilator into your vagina as far as it will go without causing pain or discomfort. - Close your knees and slowly straighten your legs. - Keep the dilator in your vagina for about 10 to 15 minutes.  Please use 3 times a week, for example: Monday, Wednesday and Friday evenings. - Bend your knees, open your legs, and gently remove the dilator. - Gently cleanse the skin around the vaginal opening. - Wash the dilator after each use. -  It is important that you use the dilator routinely until instructed otherwise by your doctor.   Coronavirus (COVID-19) Are you at risk?  Are you at risk for the Coronavirus  (COVID-19)?  To be considered HIGH RISK for Coronavirus (COVID-19), you have to meet the following criteria:  . Traveled to [place], [place], [place], [place] or [place]; or in the [place] to [place], [place], [place], or [place]; and have fever, cough, and shortness of breath within the last 2 weeks of travel OR . Been in close contact with a person diagnosed with COVID-19 within the last 2 weeks and have fever, cough, and shortness of breath . IF YOU DO NOT MEET THESE CRITERIA, YOU ARE CONSIDERED LOW RISK FOR COVID-19.  What to do if you are HIGH RISK for COVID-19?  . If you are having a medical emergency, call 911. . Seek medical care right away. Before you go to a doctor's office, urgent care or emergency department, call ahead and tell them about your recent travel, contact with someone diagnosed with COVID-19, and your symptoms. You should receive instructions from your physician's office regarding next steps of care.  . When you arrive at healthcare provider, tell the healthcare staff immediately you have returned from visiting [place], [place], [place], [place] or [place]; or traveled in the [place] to [place], [place], [place], or [place]; in the last two weeks or you have been in close contact with a person diagnosed with COVID-19 in the last 2 weeks.   . Tell the health care staff about your symptoms: fever, cough and shortness of breath. .   After you have been seen by a medical provider, you will be either: o Tested for (COVID-19) and discharged home on quarantine except to seek medical care if symptoms worsen, and asked to  - Stay home and avoid contact with others until you get your results (4-5 days)  - Avoid travel on public transportation if possible (such as bus, train, or airplane) or o Sent to the Emergency Department by EMS for evaluation, COVID-19 testing, and possible admission depending on your condition and test results.  What to do if you are LOW  RISK for COVID-19?  Reduce your risk of any infection by using the same precautions used for avoiding the common cold or flu:  . Wash your hands often with soap and warm water for at least 20 seconds.  If soap and water are not readily available, use an alcohol-based hand sanitizer with at least 60% alcohol.  . If coughing or sneezing, cover your mouth and nose by coughing or sneezing into the elbow areas of your shirt or coat, into a tissue or into your sleeve (not your hands). . Avoid shaking hands with others and consider head nods or verbal greetings only. . Avoid touching your eyes, nose, or mouth with unwashed hands.  . Avoid close contact with people who are sick. . Avoid places or events with large numbers of people in one location, like concerts or sporting events. . Carefully consider travel plans you have or are making. . If you are planning any travel outside or inside the [place], visit the CDC's Travelers' Health webpage for the latest health notices. . If you have some symptoms but not all symptoms, continue to monitor at home and seek medical attention if your symptoms worsen. . If you are having a medical emergency, call 911.   ADDITIONAL HEALTHCARE OPTIONS FOR PATIENTS  [facility] / e-Visit: [email or web address]         [facility]: [phone]  [facility]: [phone]                   [facility]: [phone]   

## 2019-03-22 NOTE — Progress Notes (Signed)
Pt presents today for f/u with Dr. Sondra Come. Pt denies c/o pain. Pt denies dysuria/hematuria. Pt denies vaginal bleeding/discharge. Pt denies rectal bleeding, diarrhea/constipation. Pt denies abdominal bloating, N/V. Pt reports gas-like pain RUQ "up under rib cage". Pt reports anti-gas medications do not relieve pain.   Vaginal dilator teaching done with good understanding.   BP (!) 116/40 (BP Location: Left Arm, Patient Position: Sitting)   Pulse 99   Temp 98.2 F (36.8 C) (Temporal)   Resp 16   Wt 114 lb 3.2 oz (51.8 kg)   SpO2 100%   BMI 18.16 kg/m   Wt Readings from Last 3 Encounters:  03/22/19 114 lb 3.2 oz (51.8 kg)  02/18/19 111 lb (50.3 kg)  02/11/19 109 lb 9.6 oz (49.7 kg)   Loma Sousa, RN BSN    Home Care Instructions for the Insertion and Care of Your Vaginal Dilator  Why Do I Need a Vaginal Dilator?  Internal radiation therapy may cause scar tissue to form at the top of your vagina (vaginal cuff).  This may make vaginal examinations difficult in the future. You can prevent scar tissue from forming by using a vaginal dilator (a smooth plastic rod), and/or by having regular sexual intercourse.  If not using the dilator you should be having intercourse two or three times a week.  If you are unable to have intercourse, you should use your vaginal dilator.  You may have some spotting or bleeding from your dilator or intercourse the first few times. You may also have some discomfort. If discomfort occurs with intercourse, you and your partner may need to stop for a while and try again later.  How to Use Your Vaginal Dilator  - Wash the dilator with soap and water before and after each use. - Check the dilator to be sure it is smooth. Do not use the dilator if you find any roughspots. - Coat the dilator with K-Y Jelly, Astroglide, or Replens. Do not use Vaseline, baby oil, or other oil based lubricants. They are not water-soluble and can be irritating to the  tissues in the vagina. - Lie on your back with your knees bent and legs apart. - Insert the rounded end of the dilator into your vagina as far as it will go without causing pain or discomfort. - Close your knees and slowly straighten your legs. - Keep the dilator in your vagina for about 10 to 15 minutes.  Please use 3 times a week, for example: Monday, Wednesday and Friday evenings. [facility] your knees, open your legs, and gently remove the dilator. - Gently cleanse the skin around the vaginal opening. - Wash the dilator after each use. -  It is important that you use the dilator routinely until instructed otherwise by your doctor.   Loma Sousa, RN BSN

## 2019-03-22 NOTE — Progress Notes (Signed)
Radiation Oncology         [phone] ________________________________  Name: Jennifer Santos MRN: [id number]  Date: 03/22/2019  DOB: April 10, 1966  Follow-Up Visit Note  CC: Jennifer Merles, MD  Jennifer Marus, MD    ICD-10-CM   1. Malignant neoplasm of endocervix [facility])  C53.0     Diagnosis:   53 y.o.female withStage IIIC-2SquamousCellCarcinoma of theCervix  Interval Since Last Radiation:  1 month 1. 11/30/2018 - 01/11/2019: (IMRT)  (a) Pelvis/Paraaortic / 45 Gy in 25 fractions  (b) Pelvis, Boost / 9 Gy in 5 fractions 2. 6/18, 6/22, 6/30, 7/9, 02/18/2019: (Brachytherapy, HDR, Ir-192) Cervix, Tandem/Ring System, 60, 40 mm / 27.5 Gy in 5 fractions   Narrative:  The patient returns today for routine follow-up. She was given vaginal dilators and instructions on their use by our nurse, Jennifer Santos.  She is scheduled to undergo PET scan on 05/24/2019.  On review of systems, she reports mild right upper quadrant abdominal pain. She describes it as gas-like and located "up under rib cage." She states anti-gas medications do not relieve the pain. She denies pain, dysuria or hematuria, vaginal bleeding or discharge, rectal bleeding, diarrhea or constipation, abdominal bloating, and nausea or vomiting.  ALLERGIES:  has No Known Allergies.  Meds: Current Outpatient Medications  Medication Sig Dispense Refill  . acetaminophen (TYLENOL) 500 MG tablet Take 1,000 mg by mouth every 6 (six) hours as needed (pain).    Marland Kitchen diphenoxylate-atropine (LOMOTIL) 2.5-0.025 MG tablet Take 1 tablet by mouth 4 (four) times daily as needed for diarrhea or loose stools. (Patient not taking: Reported on 03/22/2019) 30 tablet 0  . magnesium oxide (MAG-OX) 400 (241.3 Mg) MG tablet Take 1 tablet (400 mg total) by mouth daily. (Patient not taking: Reported on 03/22/2019) 30 tablet 1  . morphine (MSIR) 15 MG tablet Take 1 tablet (15 mg total) by mouth every 4 (four) hours as needed for severe pain. (Patient not taking: Reported  on 03/22/2019) 30 tablet 0  . tiZANidine (ZANAFLEX) 2 MG tablet Take 2 mg by mouth at bedtime as needed.      No current facility-administered medications for this encounter.     Physical Findings: The patient is in no acute distress. Patient is alert and oriented.  weight is 114 lb 3.2 oz (51.8 kg). Her temporal temperature is 98.2 F (36.8 C). Her blood pressure is 116/40 (abnormal) and her pulse is 99. Her respiration is 16 and oxygen saturation is 100%. .  No significant changes. Lungs are clear to auscultation bilaterally. Heart has regular rate and rhythm. No palpable cervical, supraclavicular, or axillary adenopathy. Abdomen soft, non-tender, normal bowel sounds. Pelvic exam deferred in light of recent completion of treatment.  Lab Findings: Lab Results  Component Value Date   WBC 3.8 (L) 03/01/2019   HGB 9.7 (L) 03/01/2019   HCT 29.7 (L) 03/01/2019   MCV 103.1 (H) 03/01/2019   PLT 189 03/01/2019    Radiographic Findings: No results found.  Impression:  The patient is recovering from the effects of radiation.  She denies any significant pain at this time.  She has started back working part-time.  energy level is close to 100%.  Plan: PET scan in October and then follow-up with Dr. Denman Santos or Dr. Alycia Santos.  Routine follow-up in radiation oncology 5 months.  ____________________________________ Gery Pray, MD   This document serves as a record of services personally performed by Gery Pray, MD. It was created on his behalf by Wilburn Mylar, a trained medical  scribe. The creation of this record is based on the scribe's personal observations and the provider's statements to them. This document has been checked and approved by the attending provider.

## 2019-03-23 ENCOUNTER — Telehealth: Payer: Self-pay | Admitting: *Deleted

## 2019-03-23 NOTE — Telephone Encounter (Signed)
Called patient to inform of fu with Dr. Sondra Come on 08/23/19 @ 10 am, patient agreed to time and date

## 2019-04-26 ENCOUNTER — Inpatient Hospital Stay: Payer: BLUE CROSS/BLUE SHIELD | Attending: Gynecologic Oncology

## 2019-04-26 ENCOUNTER — Inpatient Hospital Stay: Payer: BLUE CROSS/BLUE SHIELD

## 2019-04-26 ENCOUNTER — Other Ambulatory Visit: Payer: Self-pay

## 2019-04-26 DIAGNOSIS — C531 Malignant neoplasm of exocervix: Secondary | ICD-10-CM | POA: Diagnosis not present

## 2019-04-26 LAB — CBC WITH DIFFERENTIAL/PLATELET
Abs Immature Granulocytes: 0.01 10*3/uL (ref 0.00–0.07)
Basophils Absolute: 0 10*3/uL (ref 0.0–0.1)
Basophils Relative: 0 %
Eosinophils Absolute: 0.1 10*3/uL (ref 0.0–0.5)
Eosinophils Relative: 2 %
HCT: 34.6 % — ABNORMAL LOW (ref 36.0–46.0)
Hemoglobin: 11.6 g/dL — ABNORMAL LOW (ref 12.0–15.0)
Immature Granulocytes: 0 %
Lymphocytes Relative: 17 %
Lymphs Abs: 0.6 10*3/uL — ABNORMAL LOW (ref 0.7–4.0)
MCH: 30.9 pg (ref 26.0–34.0)
MCHC: 33.5 g/dL (ref 30.0–36.0)
MCV: 92.3 fL (ref 80.0–100.0)
Monocytes Absolute: 0.3 10*3/uL (ref 0.1–1.0)
Monocytes Relative: 8 %
Neutro Abs: 2.6 10*3/uL (ref 1.7–7.7)
Neutrophils Relative %: 73 %
Platelets: 166 10*3/uL (ref 150–400)
RBC: 3.75 MIL/uL — ABNORMAL LOW (ref 3.87–5.11)
RDW: 12.1 % (ref 11.5–15.5)
WBC: 3.6 10*3/uL — ABNORMAL LOW (ref 4.0–10.5)
nRBC: 0 % (ref 0.0–0.2)

## 2019-04-26 MED ORDER — SODIUM CHLORIDE 0.9% FLUSH
10.0000 mL | Freq: Once | INTRAVENOUS | Status: AC
  Administered 2019-04-26: 10 mL
  Filled 2019-04-26: qty 10

## 2019-04-26 MED ORDER — HEPARIN SOD (PORK) LOCK FLUSH 100 UNIT/ML IV SOLN
250.0000 [IU] | Freq: Once | INTRAVENOUS | Status: DC
  Filled 2019-04-26: qty 5

## 2019-04-26 MED ORDER — HEPARIN SOD (PORK) LOCK FLUSH 100 UNIT/ML IV SOLN
500.0000 [IU] | Freq: Once | INTRAVENOUS | Status: AC
  Administered 2019-04-26: 500 [IU]
  Filled 2019-04-26: qty 5

## 2019-05-17 ENCOUNTER — Telehealth: Payer: Self-pay | Admitting: Hematology and Oncology

## 2019-05-17 NOTE — Telephone Encounter (Signed)
Scheduled appt per 10/09 sch message - called pt . No answer . Left message with appt date and time

## 2019-05-19 ENCOUNTER — Telehealth: Payer: Self-pay | Admitting: Oncology

## 2019-05-19 NOTE — Telephone Encounter (Signed)
Left a message with appointment to follow up with Dr. Denman George on 06/01/19 at 3:15.

## 2019-05-21 ENCOUNTER — Other Ambulatory Visit: Payer: Self-pay

## 2019-05-21 DIAGNOSIS — Z20822 Contact with and (suspected) exposure to covid-19: Secondary | ICD-10-CM

## 2019-05-23 LAB — NOVEL CORONAVIRUS, NAA: SARS-CoV-2, NAA: NOT DETECTED

## 2019-05-24 ENCOUNTER — Inpatient Hospital Stay: Payer: BLUE CROSS/BLUE SHIELD

## 2019-05-24 ENCOUNTER — Ambulatory Visit (HOSPITAL_COMMUNITY)
Admission: RE | Admit: 2019-05-24 | Discharge: 2019-05-24 | Disposition: A | Discharge status: Home / Self Care | Payer: BLUE CROSS/BLUE SHIELD | Source: Ambulatory Visit | Attending: Hematology and Oncology | Admitting: Hematology and Oncology

## 2019-05-24 ENCOUNTER — Inpatient Hospital Stay: Payer: BLUE CROSS/BLUE SHIELD | Attending: Gynecologic Oncology

## 2019-05-24 ENCOUNTER — Other Ambulatory Visit: Payer: Self-pay

## 2019-05-24 DIAGNOSIS — C531 Malignant neoplasm of exocervix: Secondary | ICD-10-CM

## 2019-05-24 DIAGNOSIS — K769 Liver disease, unspecified: Secondary | ICD-10-CM | POA: Insufficient documentation

## 2019-05-24 DIAGNOSIS — D61818 Other pancytopenia: Secondary | ICD-10-CM | POA: Diagnosis not present

## 2019-05-24 DIAGNOSIS — F17210 Nicotine dependence, cigarettes, uncomplicated: Secondary | ICD-10-CM | POA: Insufficient documentation

## 2019-05-24 LAB — CBC WITH DIFFERENTIAL/PLATELET
Abs Immature Granulocytes: 0.02 10*3/uL (ref 0.00–0.07)
Basophils Absolute: 0 10*3/uL (ref 0.0–0.1)
Basophils Relative: 0 %
Eosinophils Absolute: 0.1 10*3/uL (ref 0.0–0.5)
Eosinophils Relative: 1 %
HCT: 32.8 % — ABNORMAL LOW (ref 36.0–46.0)
Hemoglobin: 10.9 g/dL — ABNORMAL LOW (ref 12.0–15.0)
Immature Granulocytes: 0 %
Lymphocytes Relative: 10 %
Lymphs Abs: 0.5 10*3/uL — ABNORMAL LOW (ref 0.7–4.0)
MCH: 30.2 pg (ref 26.0–34.0)
MCHC: 33.2 g/dL (ref 30.0–36.0)
MCV: 90.9 fL (ref 80.0–100.0)
Monocytes Absolute: 0.5 10*3/uL (ref 0.1–1.0)
Monocytes Relative: 9 %
Neutro Abs: 4.3 10*3/uL (ref 1.7–7.7)
Neutrophils Relative %: 80 %
Platelets: 200 10*3/uL (ref 150–400)
RBC: 3.61 MIL/uL — ABNORMAL LOW (ref 3.87–5.11)
RDW: 12.6 % (ref 11.5–15.5)
WBC: 5.5 10*3/uL (ref 4.0–10.5)
nRBC: 0 % (ref 0.0–0.2)

## 2019-05-24 LAB — GLUCOSE, CAPILLARY: Glucose-Capillary: 97 mg/dL (ref 70–99)

## 2019-05-24 MED ORDER — FLUDEOXYGLUCOSE F - 18 (FDG) INJECTION
6.3000 | Freq: Once | INTRAVENOUS | Status: AC | PRN
  Administered 2019-05-24: 6.3 via INTRAVENOUS

## 2019-05-24 MED ORDER — SODIUM CHLORIDE 0.9% FLUSH
10.0000 mL | Freq: Once | INTRAVENOUS | Status: AC
  Administered 2019-05-24: 10 mL
  Filled 2019-05-24: qty 10

## 2019-05-24 NOTE — Patient Instructions (Signed)

## 2019-05-25 ENCOUNTER — Inpatient Hospital Stay: Payer: BLUE CROSS/BLUE SHIELD | Admitting: Hematology and Oncology

## 2019-05-25 ENCOUNTER — Other Ambulatory Visit: Payer: Self-pay

## 2019-05-25 ENCOUNTER — Encounter: Payer: Self-pay | Admitting: Hematology and Oncology

## 2019-05-25 ENCOUNTER — Encounter: Payer: Self-pay | Admitting: Oncology

## 2019-05-25 DIAGNOSIS — M549 Dorsalgia, unspecified: Secondary | ICD-10-CM

## 2019-05-25 DIAGNOSIS — C530 Malignant neoplasm of endocervix: Secondary | ICD-10-CM | POA: Diagnosis not present

## 2019-05-25 DIAGNOSIS — G8929 Other chronic pain: Secondary | ICD-10-CM

## 2019-05-25 DIAGNOSIS — N1330 Unspecified hydronephrosis: Secondary | ICD-10-CM

## 2019-05-25 DIAGNOSIS — Z720 Tobacco use: Secondary | ICD-10-CM | POA: Diagnosis not present

## 2019-05-25 DIAGNOSIS — C531 Malignant neoplasm of exocervix: Secondary | ICD-10-CM | POA: Diagnosis not present

## 2019-05-25 NOTE — Assessment & Plan Note (Signed)
Her hydronephrosis has resolved She is not symptomatic

## 2019-05-25 NOTE — Assessment & Plan Note (Signed)
I have reviewed multiple imaging studies with the patient Jennifer Santos has almost completely recovered from multiple side effects of treatment Jennifer Santos has started to gain some weight Her pancytopenia is improving Unfortunately, PET CT scan revealed possible new signs of metastatic cancer to the liver I recommend to present her case at the next GYN oncology tumor board I will also request pathologist to add PD-L1 testing to her previous cervical biopsy sample to see if pembrolizumab would be a potential treatment option for her I reviewed the current guidelines with the patient If the final outcome of the discussion did suggest new onset of recurrent metastatic disease, Jennifer Santos would need to restart palliative systemic chemotherapy soon I will call her on Monday to discuss final outcome of the tumor board discussion Thankfully, Jennifer Santos is not symptomatic at present

## 2019-05-25 NOTE — Assessment & Plan Note (Signed)
We discussed a trial of nicotine patch to help her quit smoking

## 2019-05-25 NOTE — Progress Notes (Signed)
Requested PD-L1 testing on Accession: [id number] with [facility].
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
## 2019-05-26 ENCOUNTER — Telehealth: Payer: Self-pay | Admitting: Family Medicine

## 2019-05-26 NOTE — Telephone Encounter (Signed)
Negative COVID results given. Patient results "NOT Detected." Caller expressed understanding. ° °

## 2019-05-28 ENCOUNTER — Other Ambulatory Visit (HOSPITAL_COMMUNITY)
Admission: RE | Admit: 2019-05-28 | Discharge: 2019-05-28 | Disposition: A | Discharge status: Home / Self Care | Payer: BLUE CROSS/BLUE SHIELD | Source: Ambulatory Visit | Attending: Hematology and Oncology | Admitting: Hematology and Oncology

## 2019-05-28 DIAGNOSIS — C530 Malignant neoplasm of endocervix: Secondary | ICD-10-CM | POA: Diagnosis present

## 2019-05-31 ENCOUNTER — Telehealth: Payer: Self-pay | Admitting: Hematology and Oncology

## 2019-05-31 ENCOUNTER — Other Ambulatory Visit: Payer: Self-pay | Admitting: Oncology

## 2019-05-31 NOTE — Telephone Encounter (Signed)
Called pt per 10/26 sch message - pt is aware of appt date and time

## 2019-05-31 NOTE — Progress Notes (Signed)
Gynecologic Oncology Multi-Disciplinary Disposition Conference Note  Date of the Conference: 05/31/2019  Patient Name: Jennifer Santos  Referring Provider: Dr. Kenton Kingfisher Primary GYN Oncologist: Dr. Alycia Rossetti  Stage/Disposition:  Stage IB grade 3 squamous cell carcinoma of the cervix. Disposition is to palliative chemotherapy with carboplatin, taxol and Avastin or if PD-L1 is positive, recommendation is for pembrolizumab.   This Multidisciplinary conference took place involving physicians from [place], [place], Radiation Oncology, Pathology, Radiology along with the Gynecologic Oncology Nurse Practitioner and RN.  Comprehensive assessment of the patient's malignancy, staging, need for surgery, chemotherapy, radiation therapy, and need for further testing were reviewed. Supportive measures, both inpatient and following discharge were also discussed. The recommended plan of care is documented. Greater than 35 minutes were spent correlating and coordinating this patient's care.

## 2019-06-01 ENCOUNTER — Inpatient Hospital Stay: Payer: BLUE CROSS/BLUE SHIELD

## 2019-06-01 ENCOUNTER — Other Ambulatory Visit: Payer: Self-pay

## 2019-06-01 ENCOUNTER — Other Ambulatory Visit: Payer: Self-pay | Admitting: Hematology and Oncology

## 2019-06-01 ENCOUNTER — Inpatient Hospital Stay: Payer: BLUE CROSS/BLUE SHIELD | Admitting: Hematology and Oncology

## 2019-06-01 ENCOUNTER — Ambulatory Visit: Payer: BLUE CROSS/BLUE SHIELD | Admitting: Gynecologic Oncology

## 2019-06-01 ENCOUNTER — Encounter: Payer: Self-pay | Admitting: Hematology and Oncology

## 2019-06-01 DIAGNOSIS — C530 Malignant neoplasm of endocervix: Secondary | ICD-10-CM

## 2019-06-01 DIAGNOSIS — Z720 Tobacco use: Secondary | ICD-10-CM

## 2019-06-01 DIAGNOSIS — C531 Malignant neoplasm of exocervix: Secondary | ICD-10-CM

## 2019-06-01 DIAGNOSIS — Z7189 Other specified counseling: Secondary | ICD-10-CM

## 2019-06-01 DIAGNOSIS — M549 Dorsalgia, unspecified: Secondary | ICD-10-CM | POA: Diagnosis not present

## 2019-06-01 DIAGNOSIS — G8929 Other chronic pain: Secondary | ICD-10-CM

## 2019-06-01 LAB — CMP (CANCER CENTER ONLY)
ALT: 13 U/L (ref 0–44)
AST: 19 U/L (ref 15–41)
Albumin: 4.1 g/dL (ref 3.5–5.0)
Alkaline Phosphatase: 94 U/L (ref 38–126)
Anion gap: 12 (ref 5–15)
BUN: 11 mg/dL (ref 6–20)
CO2: 24 mmol/L (ref 22–32)
Calcium: 9.8 mg/dL (ref 8.9–10.3)
Chloride: 102 mmol/L (ref 98–111)
Creatinine: 0.89 mg/dL (ref 0.44–1.00)
GFR, Est AFR Am: >60 mL/min (ref >60)
GFR, Estimated: >60 mL/min (ref >60)
Glucose, Bld: 88 mg/dL (ref 70–99)
Potassium: 4.2 mmol/L (ref 3.5–5.1)
Sodium: 138 mmol/L (ref 135–145)
Total Bilirubin: 0.2 mg/dL — ABNORMAL LOW (ref 0.3–1.2)
Total Protein: 7.4 g/dL (ref 6.5–8.1)

## 2019-06-01 LAB — CBC WITH DIFFERENTIAL (CANCER CENTER ONLY)
Abs Immature Granulocytes: 0.02 10*3/uL (ref 0.00–0.07)
Basophils Absolute: 0 10*3/uL (ref 0.0–0.1)
Basophils Relative: 0 %
Eosinophils Absolute: 0.1 10*3/uL (ref 0.0–0.5)
Eosinophils Relative: 1 %
HCT: 35.9 % — ABNORMAL LOW (ref 36.0–46.0)
Hemoglobin: 12 g/dL (ref 12.0–15.0)
Immature Granulocytes: 0 %
Lymphocytes Relative: 12 %
Lymphs Abs: 0.6 10*3/uL — ABNORMAL LOW (ref 0.7–4.0)
MCH: 30.3 pg (ref 26.0–34.0)
MCHC: 33.4 g/dL (ref 30.0–36.0)
MCV: 90.7 fL (ref 80.0–100.0)
Monocytes Absolute: 0.4 10*3/uL (ref 0.1–1.0)
Monocytes Relative: 7 %
Neutro Abs: 4.1 10*3/uL (ref 1.7–7.7)
Neutrophils Relative %: 80 %
Platelet Count: 248 10*3/uL (ref 150–400)
RBC: 3.96 MIL/uL (ref 3.87–5.11)
RDW: 12.5 % (ref 11.5–15.5)
WBC Count: 5.2 10*3/uL (ref 4.0–10.5)
nRBC: 0 % (ref 0.0–0.2)

## 2019-06-01 LAB — TOTAL PROTEIN, URINE DIPSTICK: Protein, ur: NEGATIVE mg/dL

## 2019-06-01 MED ORDER — DEXAMETHASONE 4 MG PO TABS: ORAL_TABLET | ORAL | 0 refills | Status: DC

## 2019-06-01 NOTE — Assessment & Plan Note (Signed)
We discussed the importance of smoking cessation

## 2019-06-01 NOTE — Progress Notes (Signed)
[place] OFFICE PROGRESS NOTE  Patient Care Team: Marguerita Merles, MD as PCP - General (Family Medicine) Gae Dry, MD as Referring Physician (Obstetrics and Gynecology) Clent Jacks, RN as Registered Nurse Awanda Mink, Craige Cotta, RN as Oncology Nurse Navigator (Oncology)  ASSESSMENT & PLAN:  Cervical ca [facility]) Her case was discussed at the GYN oncology tumor board and the conclusion was the patient has recurrent metastatic cancer Disease is considered incurable I explained to the patient why surgical excision is not a valid plan of action  We reviewed the NCCN guidelines We discussed the role of chemotherapy. The intent is of palliative intent  Phase II trial of paclitaxel, carboplatin, and bevacizumab for advanced or recurrent cervical cancer Bennye Alm, Maudie Mercury, Debbe Bales, Joie Bimler, Tomo[place] Jimi, [place], [place], Takaya [place], [place], Stevphen [place]  Gynecol Oncol. 2019 Sep;154(3):554-557  Abstract Objective: We evaluated the efficacy and safety of the combination of paclitaxel, carboplatin, and bevacizumab in patients with advanced or recurrent cervical cancer.  Methods: Subjects included patients with advanced or recurrent cervical cancer not amenable to curative treatment with surgery or radiation therapy. Treatment consisted of paclitaxel 175 mg/m2, carboplatin area under the curve 6 mg/mL/min, and bevacizumab 15 mg/kg every 21 days until disease progression, complete remission, or limiting toxicity. The primary endpoint was the objective response.  Results: In total, 34 patients received a median of 6 treatment cycles (range 2-25). The median follow-up period was 18.5 months (range 2-29). The objective response was 88% (95% confidence interval: 72.5%-96.7%). Seventeen patients (50%) experienced complete response, whereas 13 patients experienced (38%) partial response with a median duration of 6 months. Grades 3 and 4  hematologic toxicities manifested as neutropenia in 14 (41.2%), leukopenia in 14 (41.2%), anemia in 11 (32.4%), and thrombocytopenia in 9 (26.5%) patients. One patient who underwent prior pelvic irradiation developed grade 2 rectovaginal fistula.  Conclusion: The combination of paclitaxel, carboplatin, and bevacizumab is effective and safe in patients with advanced or recurrent cervical cancer.  We discussed some of the risks, benefits, side-effects of carboplatin, Taxol and Avastin. Treatment is intravenous, every 3 weeks x 6 cycles  Some of the short term side-effects included, though not limited to, including weight loss, life threatening infections, risk of allergic reactions, need for transfusions of blood products, nausea, vomiting, change in bowel habits, loss of hair, blood clots, bleeding, hypertension, proteinuria, admission to hospital for various reasons, and risks of death.   Long term side-effects are also discussed including risks of infertility, permanent damage to nerve function, hearing loss, chronic fatigue, kidney damage with possibility needing hemodialysis, and rare secondary malignancy including bone marrow disorders.  The patient is aware that the response rates discussed earlier is not guaranteed.  After a long discussion, patient made an informed decision to proceed with the prescribed plan of care.   Patient education material was dispensed. We discussed premedication with dexamethasone before chemotherapy. I do not plan prophylactic G-CSF support We discussed the importance of blood pressure monitoring twice a day while on treatment I plan to reduce carboplatin to give her AUC of 5 instead of 6   Chronic back pain greater than 3 months duration The abnormalities seen on PET CT scan is consistent with previous fracture and not from metastatic cancer  Tobacco abuse We discussed the importance of smoking cessation  Goals of care, counseling/discussion We discussed  the goals of care of treatment is palliative in nature Due to the incurable nature of her disease,  I recommend consideration to apply for long term disability   No orders of the defined types were placed in this encounter.   INTERVAL HISTORY: Please see below for problem oriented charting. She returns to review plan of care She is feeling well Denies abdominal pain no abnormal bleeding She continues to have mild lower back pain which is stable  SUMMARY OF ONCOLOGIC HISTORY: Oncology History  Cervical ca ([place])  11/03/2018 Initial Diagnosis   She presented with postmenopausal bleeding since 2019 (at least 1 year) and weight loss. Had prior abnormal PAP smear, last done in 2010   11/04/2018 Pathology Results   Endometrium, biopsy - INVASIVE MODERATELY DIFFERENTIATED SQUAMOUS CELL CARCINOMA. SEE NOTE Diagnosis Note Squamous cell carcinoma arises in a background of high-grade dysplasia with possible glandular involvement. Tumor cells are positive for CK5/6, consistent with the above diagnosis. Immunohistochemical stain for p16 is diffusely positive in the tumor cells.   11/17/2018 Cancer Staging   Staging form: Cervix Uteri, AJCC 8th Edition - Clinical: FIGO Stage IIIB, calculated as Stage IVB (cT3b, cN0, cM1) - Signed by Heath Lark, MD on 06/01/2019   11/20/2018 PET scan   IMPRESSION: 1. Cervical lesion with direct extra cervical extension posterolaterally on the right is markedly hypermetabolic consistent with known neoplasm. 2. 7 x 11 mm posterior right pelvic irregular nodule is hypermetabolic consistent with metastatic disease. 3. Para-aortic soft tissue in the region of the bifurcation and 8 mm short axis left pelvic sidewall lymph node show marked hypermetabolism, consistent with metastatic involvement. 4. Low level FDG uptake identified in a unenlarged AP window lymph node of the mediastinum with focal FDG uptake identified in each hilum. While no underlying hilar lymph nodes  evident on this noncontrast CT exam, tiny lymph nodes were visible in the hilar regions on diagnostic CT of 11/17/2018. While this may be reactive given the low level uptake, metastatic disease cannot be definitively excluded. 5. Focus of hypermetabolic activity in the spinal canal at the T11-12 level. No underlying mass lesion evident on noncontrast CT imaging. Thoracic spine MRI without and with contrast may be warranted to further evaluate. 6. Stable moderate right hydroureteronephrosis.   11/25/2018 Procedure   Placement of single lumen port a cath via right internal jugular vein. The catheter tip lies at the cavo-atrial junction. A power injectable port a cath was placed and is ready for immediate use.    11/26/2018 Imaging   MRI spine Negative for metastatic disease. No abnormality to correlate with potential lesion within the spinal canal at T11-12 as seen on prior CT is identified.  Remote, mild superior endplate compression fracture of T12.  Widely patent central canal and foramina throughout.  Right hydronephrosis as seen on prior studies.   11/27/2018 - 01/13/2019 Chemotherapy   The patient had weekly cisplatin with radiation. Last 3 doses were delayed and dose modified due to pancytopenia   05/24/2019 PET scan   1. Mixed appearance, with they were resolution of accentuated activity in the cervix and paracervical tissues as well as resolved pelvic adenopathy; but with at least two and possibly three new hypermetabolic lesions along the liver capsule suspicious for potential metastatic lesions. 2. New vertical activity in both sacral ala, with appearance and pattern favoring sacral insufficiency fracture. 3. Other imaging findings of potential clinical significance: Aortic Atherosclerosis (ICD10-I70.0). Left anterior descending coronary artery atherosclerotic calcification. Presacral density is likely treatment related. Stable T12 anterior wedge compression fracture.     REVIEW  OF SYSTEMS:   Constitutional: Denies fevers,  chills or abnormal weight loss Eyes: Denies blurriness of vision Ears, nose, mouth, throat, and face: Denies mucositis or sore throat Respiratory: Denies cough, dyspnea or wheezes Cardiovascular: Denies palpitation, chest discomfort or lower extremity swelling Gastrointestinal:  Denies nausea, heartburn or change in bowel habits Skin: Denies abnormal skin rashes Lymphatics: Denies new lymphadenopathy or easy bruising Neurological:Denies numbness, tingling or new weaknesses Behavioral/Psych: Mood is stable, no new changes  All other systems were reviewed with the patient and are negative.  I have reviewed the past medical history, past surgical history, social history and family history with the patient and they are unchanged from previous note.  ALLERGIES:  has No Known Allergies.  MEDICATIONS:  Current Outpatient Medications  Medication Sig Dispense Refill  . lidocaine-prilocaine (EMLA) cream Apply 1 application topically as needed.    . ondansetron (ZOFRAN) 8 MG tablet Take 8 mg by mouth every 8 (eight) hours as needed.    . prochlorperazine (COMPAZINE) 10 MG tablet Take 10 mg by mouth every 6 (six) hours as needed.    Marland Kitchen acetaminophen (TYLENOL) 500 MG tablet Take 1,000 mg by mouth every 6 (six) hours as needed (pain).    Marland Kitchen dexamethasone (DECADRON) 4 MG tablet Take 2 tabs at the night before and 2 tabs the morning of chemotherapy, every 3 weeks, by mouth. Please dispense 24 tabs for total 6 cycles 24 tablet 0  . tiZANidine (ZANAFLEX) 2 MG tablet Take 2 mg by mouth at bedtime as needed.      No current facility-administered medications for this visit.     PHYSICAL EXAMINATION: ECOG PERFORMANCE STATUS: 1 - Symptomatic but completely ambulatory  Vitals:   06/01/19 1443  BP: 138/76  Pulse: 82  Resp: 18  Temp: 98.5 F (36.9 C)  SpO2: 100%   Filed Weights   06/01/19 1443  Weight: 120 lb 12.8 oz (54.8 kg)    GENERAL:alert, no  distress and comfortable Musculoskeletal:no cyanosis of digits and no clubbing  NEURO: alert & oriented x 3 with fluent speech, no focal motor/sensory deficits  LABORATORY DATA:  I have reviewed the data as listed    Component Value Date/Time   NA 138 06/01/2019 1511   K 4.2 06/01/2019 1511   CL 102 06/01/2019 1511   CO2 24 06/01/2019 1511   GLUCOSE 88 06/01/2019 1511   BUN 11 06/01/2019 1511   CREATININE 0.89 06/01/2019 1511   CALCIUM 9.8 06/01/2019 1511   PROT 7.4 06/01/2019 1511   ALBUMIN 4.1 06/01/2019 1511   AST 19 06/01/2019 1511   ALT 13 06/01/2019 1511   ALKPHOS 94 06/01/2019 1511   BILITOT 0.2 (L) 06/01/2019 1511   GFRNONAA >60 06/01/2019 1511   GFRAA >60 06/01/2019 1511    No results found for: SPEP, UPEP  Lab Results  Component Value Date   WBC 5.2 06/01/2019   NEUTROABS 4.1 06/01/2019   HGB 12.0 06/01/2019   HCT 35.9 (L) 06/01/2019   MCV 90.7 06/01/2019   PLT 248 06/01/2019      Chemistry      Component Value Date/Time   NA 138 06/01/2019 1511   K 4.2 06/01/2019 1511   CL 102 06/01/2019 1511   CO2 24 06/01/2019 1511   BUN 11 06/01/2019 1511   CREATININE 0.89 06/01/2019 1511      Component Value Date/Time   CALCIUM 9.8 06/01/2019 1511   ALKPHOS 94 06/01/2019 1511   AST 19 06/01/2019 1511   ALT 13 06/01/2019 1511   BILITOT 0.2 (L) 06/01/2019  1511       RADIOGRAPHIC STUDIES: I have personally reviewed the radiological images as listed and agreed with the findings in the report. Nm Pet Image Restag (ps) Skull Base To Thigh  Result Date: 05/24/2019 CLINICAL DATA:  Subsequent treatment strategy for cervical cancer. EXAM: NUCLEAR MEDICINE PET SKULL BASE TO THIGH TECHNIQUE: 6.3 mCi F-18 FDG was injected intravenously. Full-ring PET imaging was performed from the skull base to thigh after the radiotracer. CT data was obtained and used for attenuation correction and anatomic localization. Fasting blood glucose: 97 mg/dl COMPARISON:  11/20/2018  FINDINGS: Mediastinal blood pool activity: SUV max 1.8 Liver activity: SUV max NA NECK: [place] and left pterygoid muscular activity is felt to be physiologic. Incidental CT findings: none CHEST: Low-grade activity along hilar lymph nodes, maximum SUV 2.8 on the right (formerly 3.6) and 2.7 on the left (formerly 3.6 Incidental CT findings: Right Port-A-Cath tip: Right atrium. Mild left anterior descending coronary artery atherosclerotic calcification. Biapical pleuroparenchymal scarring. ABDOMEN/PELVIS: There two hypermetabolic deposits along the liver. One of these is just anterior to the falciform ligament and measures approximately 0.8 cm in diameter with maximum SUV 6.1. A second lesion anterior to segment 4 of the liver is right along the diaphragm, measuring about 1.0 cm in thickness and about 2.3 cm transverse, maximum SUV 4.3. Background hepatic activity maximum SUV 3.0. There is a questionable third hypermetabolic lesion along the diaphragm along the dome of the right hepatic lobe, not appreciable on the CT data but with maximum SUV of 3.1, only minimally higher than background liver activity. The large hypermetabolic cervical mass is markedly improved in the antrum. Maximum SUV in the cervix and paracervical tissues is 3.4, previously 16.5. The previously hypermetabolic common iliac and left external iliac lymph nodes have resolved. Incidental CT findings: Aortoiliac atherosclerotic vascular disease. Previous right hydronephrosis is no longer present. Presacral density is likely treatment related. SKELETON: Accentuated vertical activity in both sacral ala laterally, relatively symmetric with the upper right sacral ala hyperintensity having a maximum SUV of 5.0 and the left 4.0. There is some new slight irregularity along the upper sacral margin anteriorly better appreciated on the right side. Incidental CT findings: Stable T12 anterior wedge compression fracture. IMPRESSION: 1. Mixed appearance, with they  were resolution of accentuated activity in the cervix and paracervical tissues as well as resolved pelvic adenopathy; but with at least two and possibly three new hypermetabolic lesions along the liver capsule suspicious for potential metastatic lesions. 2. New vertical activity in both sacral ala, with appearance and pattern favoring sacral insufficiency fracture. 3. Other imaging findings of potential clinical significance: Aortic Atherosclerosis (ICD10-I70.0). Left anterior descending coronary artery atherosclerotic calcification. Presacral density is likely treatment related. Stable T12 anterior wedge compression fracture. Electronically Signed   By: Van Clines M.D.   On: 05/24/2019 16:49    All questions were answered. The patient knows to call the clinic with any problems, questions or concerns. No barriers to learning was detected.  I spent 40 minutes counseling the patient face to face. The total time spent in the appointment was 50 minutes and more than 50% was on counseling and review of test results  Heath Lark, MD 06/01/2019 5:06 PM

## 2019-06-01 NOTE — Assessment & Plan Note (Signed)
The abnormalities seen on PET CT scan is consistent with previous fracture and not from metastatic cancer

## 2019-06-01 NOTE — Progress Notes (Signed)
DISCONTINUE OFF PATHWAY REGIMEN - Other Dx   [id number]:Cisplatin 40 mg/m2 IV D1 q7 Days + RT:   A cycle is every 7 days:     Cisplatin   **Always confirm dose/schedule in your pharmacy ordering system**  REASON: Disease Progression PRIOR TREATMENT: Cisplatin 40 mg/m2 IV D1 q7 Days + RT TREATMENT RESPONSE: Progressive Disease (PD)  START OFF PATHWAY REGIMEN - Other   [id number]:Bevacizumab 15 mg/kg IV D1 + Carboplatin AUC=5 IV D1 + Paclitaxel 175 mg/m2 IV D1 q21 Days:   A cycle is every 21 days:     Bevacizumab-xxxx      Paclitaxel      Carboplatin   **Always confirm dose/schedule in your pharmacy ordering system**  Patient Characteristics: Intent of Therapy: Non-Curative / Palliative Intent, Discussed with Patient

## 2019-06-01 NOTE — Assessment & Plan Note (Addendum)
We discussed the goals of care of treatment is palliative in nature Due to the incurable nature of her disease, I recommend consideration to apply for long term disability

## 2019-06-01 NOTE — Assessment & Plan Note (Addendum)
Her case was discussed at the GYN oncology tumor board and the conclusion was the patient has recurrent metastatic cancer Disease is considered incurable I explained to the patient why surgical excision is not a valid plan of action  We reviewed the NCCN guidelines We discussed the role of chemotherapy. The intent is of palliative intent  Phase II trial of paclitaxel, carboplatin, and bevacizumab for advanced or recurrent cervical cancer Bennye Alm, Maudie Mercury, Debbe Bales, Joie Bimler, [place] Jimi, [place], Miho [place], Takaya [place], [place], Stevphen [place]  Gynecol Oncol. 2019 Sep;154(3):554-557  Abstract Objective: We evaluated the efficacy and safety of the combination of paclitaxel, carboplatin, and bevacizumab in patients with advanced or recurrent cervical cancer.  Methods: Subjects included patients with advanced or recurrent cervical cancer not amenable to curative treatment with surgery or radiation therapy. Treatment consisted of paclitaxel 175 mg/m2, carboplatin area under the curve 6 mg/mL/min, and bevacizumab 15 mg/kg every 21 days until disease progression, complete remission, or limiting toxicity. The primary endpoint was the objective response.  Results: In total, 34 patients received a median of 6 treatment cycles (range 2-25). The median follow-up period was 18.5 months (range 2-29). The objective response was 88% (95% confidence interval: 72.5%-96.7%). Seventeen patients (50%) experienced complete response, whereas 13 patients experienced (38%) partial response with a median duration of 6 months. Grades 3 and 4 hematologic toxicities manifested as neutropenia in 14 (41.2%), leukopenia in 14 (41.2%), anemia in 11 (32.4%), and thrombocytopenia in 9 (26.5%) patients. One patient who underwent prior pelvic irradiation developed grade 2 rectovaginal fistula.  Conclusion: The combination of paclitaxel, carboplatin, and bevacizumab is effective and  safe in patients with advanced or recurrent cervical cancer.  We discussed some of the risks, benefits, side-effects of carboplatin, Taxol and Avastin. Treatment is intravenous, every 3 weeks x 6 cycles  Some of the short term side-effects included, though not limited to, including weight loss, life threatening infections, risk of allergic reactions, need for transfusions of blood products, nausea, vomiting, change in bowel habits, loss of hair, blood clots, bleeding, hypertension, proteinuria, admission to hospital for various reasons, and risks of death.   Long term side-effects are also discussed including risks of infertility, permanent damage to nerve function, hearing loss, chronic fatigue, kidney damage with possibility needing hemodialysis, and rare secondary malignancy including bone marrow disorders.  The patient is aware that the response rates discussed earlier is not guaranteed.  After a long discussion, patient made an informed decision to proceed with the prescribed plan of care.   Patient education material was dispensed. We discussed premedication with dexamethasone before chemotherapy. I do not plan prophylactic G-CSF support We discussed the importance of blood pressure monitoring twice a day while on treatment I plan to reduce carboplatin to give her AUC of 5 instead of 6

## 2019-06-03 ENCOUNTER — Telehealth: Payer: Self-pay | Admitting: Hematology and Oncology

## 2019-06-03 ENCOUNTER — Telehealth: Payer: Self-pay | Admitting: *Deleted

## 2019-06-03 NOTE — Telephone Encounter (Signed)
[place] Work  Clinical Social Work was referred by medical oncology for assessment of psychosocial needs; specifically applying for disability.  Clinical Social Worker attempted to contact patient by phone  to offer support and assess for needs.  CSW left voicemail requesting patient return call.      Gwinda Maine, LCSW  Clinical Social Worker [facility]

## 2019-06-03 NOTE — Telephone Encounter (Signed)
Scheduled appt per 10/27 sch message - pt is aware of appt date and time.   

## 2019-06-07 ENCOUNTER — Inpatient Hospital Stay: Payer: BLUE CROSS/BLUE SHIELD | Attending: Gynecologic Oncology

## 2019-06-07 ENCOUNTER — Encounter: Payer: Self-pay | Admitting: *Deleted

## 2019-06-07 ENCOUNTER — Other Ambulatory Visit: Payer: Self-pay

## 2019-06-07 ENCOUNTER — Ambulatory Visit: Payer: BLUE CROSS/BLUE SHIELD

## 2019-06-07 VITALS — BP 124/64 | HR 66 | Temp 98.0°F | Resp 16

## 2019-06-07 DIAGNOSIS — Z5111 Encounter for antineoplastic chemotherapy: Secondary | ICD-10-CM | POA: Diagnosis present

## 2019-06-07 DIAGNOSIS — Z7189 Other specified counseling: Secondary | ICD-10-CM

## 2019-06-07 DIAGNOSIS — C531 Malignant neoplasm of exocervix: Secondary | ICD-10-CM | POA: Diagnosis present

## 2019-06-07 DIAGNOSIS — Z5112 Encounter for antineoplastic immunotherapy: Secondary | ICD-10-CM | POA: Insufficient documentation

## 2019-06-07 MED ORDER — SODIUM CHLORIDE 0.9 % IV SOLN
Freq: Once | INTRAVENOUS | Status: AC
  Administered 2019-06-07: 11:00:00 via INTRAVENOUS
  Filled 2019-06-07: qty 5

## 2019-06-07 MED ORDER — PALONOSETRON HCL INJECTION 0.25 MG/5ML
INTRAVENOUS | Status: AC
  Filled 2019-06-07: qty 5

## 2019-06-07 MED ORDER — SODIUM CHLORIDE 0.9 % IV SOLN
Freq: Once | INTRAVENOUS | Status: AC
  Administered 2019-06-07: 10:00:00 via INTRAVENOUS
  Filled 2019-06-07: qty 250

## 2019-06-07 MED ORDER — SODIUM CHLORIDE 0.9 % IV SOLN
15.0000 mg/kg | Freq: Once | INTRAVENOUS | Status: AC
  Administered 2019-06-07: 11:00:00 800 mg via INTRAVENOUS
  Filled 2019-06-07: qty 32

## 2019-06-07 MED ORDER — FAMOTIDINE IN NACL 20-0.9 MG/50ML-% IV SOLN
INTRAVENOUS | Status: AC
  Filled 2019-06-07: qty 50

## 2019-06-07 MED ORDER — SODIUM CHLORIDE 0.9 % IV SOLN
175.0000 mg/m2 | Freq: Once | INTRAVENOUS | Status: AC
  Administered 2019-06-07: 12:00:00 282 mg via INTRAVENOUS
  Filled 2019-06-07: qty 47

## 2019-06-07 MED ORDER — FAMOTIDINE IN NACL 20-0.9 MG/50ML-% IV SOLN
20.0000 mg | Freq: Once | INTRAVENOUS | Status: AC
  Administered 2019-06-07: 10:00:00 20 mg via INTRAVENOUS

## 2019-06-07 MED ORDER — SODIUM CHLORIDE 0.9 % IV SOLN
441.0000 mg | Freq: Once | INTRAVENOUS | Status: AC
  Administered 2019-06-07: 16:00:00 440 mg via INTRAVENOUS
  Filled 2019-06-07: qty 44

## 2019-06-07 MED ORDER — PALONOSETRON HCL INJECTION 0.25 MG/5ML
0.2500 mg | Freq: Once | INTRAVENOUS | Status: AC
  Administered 2019-06-07: 10:00:00 0.25 mg via INTRAVENOUS

## 2019-06-07 MED ORDER — DIPHENHYDRAMINE HCL 50 MG/ML IJ SOLN
50.0000 mg | Freq: Once | INTRAMUSCULAR | Status: AC
  Administered 2019-06-07: 10:00:00 50 mg via INTRAVENOUS

## 2019-06-07 MED ORDER — HEPARIN SOD (PORK) LOCK FLUSH 100 UNIT/ML IV SOLN
500.0000 [IU] | Freq: Once | INTRAVENOUS | Status: AC | PRN
  Administered 2019-06-07: 17:00:00 500 [IU]
  Filled 2019-06-07: qty 5

## 2019-06-07 MED ORDER — SODIUM CHLORIDE 0.9% FLUSH
10.0000 mL | INTRAVENOUS | Status: DC | PRN
  Administered 2019-06-07: 17:00:00 10 mL
  Filled 2019-06-07: qty 10

## 2019-06-07 MED ORDER — DIPHENHYDRAMINE HCL 50 MG/ML IJ SOLN
INTRAMUSCULAR | Status: AC
  Filled 2019-06-07: qty 1

## 2019-06-07 NOTE — Patient Instructions (Signed)
[facility] Discharge Instructions for Patients Receiving Chemotherapy  Today you received the following chemotherapy agents:  Paclitaxol & Carboplatin & immunotherapy agent :  Bevacizumab   To help prevent nausea and vomiting after your treatment, we encourage you to take your nausea medication as needed. If you develop nausea and vomiting that is not controlled by your nausea medication, call the clinic.   BELOW ARE SYMPTOMS THAT SHOULD BE REPORTED IMMEDIATELY:  *FEVER GREATER THAN 100.5 F  *CHILLS WITH OR WITHOUT FEVER  NAUSEA AND VOMITING THAT IS NOT CONTROLLED WITH YOUR NAUSEA MEDICATION  *UNUSUAL SHORTNESS OF BREATH  *UNUSUAL BRUISING OR BLEEDING  TENDERNESS IN MOUTH AND THROAT WITH OR WITHOUT PRESENCE OF ULCERS  *URINARY PROBLEMS  *BOWEL PROBLEMS  UNUSUAL RASH Items with * indicate a potential emergency and should be followed up as soon as possible.  Feel free to call the clinic should you have any questions or concerns. The clinic phone number is [phone].  Please show the [place] at check-in to the Emergency Department and triage nurse.  Carboplatin injection What is this medicine? CARBOPLATIN (KAR boe pla tin) is a chemotherapy drug. It targets fast dividing cells, like cancer cells, and causes these cells to die. This medicine is used to treat ovarian cancer and many other cancers. This medicine may be used for other purposes; ask your health care provider or pharmacist if you have questions. COMMON BRAND NAME(S): Paraplatin What should I tell my health care provider before I take this medicine? They need to know if you have any of these conditions:  blood disorders  hearing problems  kidney disease  recent or ongoing radiation therapy  an unusual or allergic reaction to carboplatin, cisplatin, other chemotherapy, other medicines, foods, dyes, or preservatives  pregnant or trying to get pregnant  breast-feeding How should I  use this medicine? This drug is usually given as an infusion into a vein. It is administered in a hospital or clinic by a specially trained health care professional. Talk to your pediatrician regarding the use of this medicine in children. Special care may be needed. Overdosage: If you think you have taken too much of this medicine contact a poison control center or emergency room at once. NOTE: This medicine is only for you. Do not share this medicine with others. What if I miss a dose? It is important not to miss a dose. Call your doctor or health care professional if you are unable to keep an appointment. What may interact with this medicine?  medicines for seizures  medicines to increase blood counts like filgrastim, pegfilgrastim, sargramostim  some antibiotics like amikacin, gentamicin, neomycin, streptomycin, tobramycin  vaccines Talk to your doctor or health care professional before taking any of these medicines:  acetaminophen  aspirin  ibuprofen  ketoprofen  naproxen This list may not describe all possible interactions. Give your health care provider a list of all the medicines, herbs, non-prescription drugs, or dietary supplements you use. Also tell them if you smoke, drink alcohol, or use illegal drugs. Some items may interact with your medicine. What should I watch for while using this medicine? Your condition will be monitored carefully while you are receiving this medicine. You will need important blood work done while you are taking this medicine. This drug may make you feel generally unwell. This is not uncommon, as chemotherapy can affect healthy cells as well as cancer cells. Report any side effects. Continue your course of treatment even though you feel  ill unless your doctor tells you to stop. In some cases, you may be given additional medicines to help with side effects. Follow all directions for their use. Call your doctor or health care professional for advice  if you get a fever, chills or sore throat, or other symptoms of a cold or flu. Do not treat yourself. This drug decreases your body's ability to fight infections. Try to avoid being around people who are sick. This medicine may increase your risk to bruise or bleed. Call your doctor or health care professional if you notice any unusual bleeding. Be careful brushing and flossing your teeth or using a toothpick because you may get an infection or bleed more easily. If you have any dental work done, tell your dentist you are receiving this medicine. Avoid taking products that contain aspirin, acetaminophen, ibuprofen, naproxen, or ketoprofen unless instructed by your doctor. These medicines may hide a fever. Do not become pregnant while taking this medicine. Women should inform their doctor if they wish to become pregnant or think they might be pregnant. There is a potential for serious side effects to an unborn child. Talk to your health care professional or pharmacist for more information. Do not breast-feed an infant while taking this medicine. What side effects may I notice from receiving this medicine? Side effects that you should report to your doctor or health care professional as soon as possible:  allergic reactions like skin rash, itching or hives, swelling of the face, lips, or tongue  signs of infection - fever or chills, cough, sore throat, pain or difficulty passing urine  signs of decreased platelets or bleeding - bruising, pinpoint red spots on the skin, black, tarry stools, nosebleeds  signs of decreased red blood cells - unusually weak or tired, fainting spells, lightheadedness  breathing problems  changes in hearing  changes in vision  chest pain  high blood pressure  low blood counts - This drug may decrease the number of white blood cells, red blood cells and platelets. You may be at increased risk for infections and bleeding.  nausea and vomiting  pain, swelling,  redness or irritation at the injection site  pain, tingling, numbness in the hands or feet  problems with balance, talking, walking  trouble passing urine or change in the amount of urine Side effects that usually do not require medical attention (report to your doctor or health care professional if they continue or are bothersome):  hair loss  loss of appetite  metallic taste in the mouth or changes in taste This list may not describe all possible side effects. Call your doctor for medical advice about side effects. You may report side effects to FDA at 1-800-FDA-1088. Where should I keep my medicine? This drug is given in a hospital or clinic and will not be stored at home. NOTE: This sheet is a summary. It may not cover all possible information. If you have questions about this medicine, talk to your doctor, pharmacist, or health care provider.  2020 Elsevier/Gold Standard (2007-10-27 14:38:05) Paclitaxel injection What is this medicine? PACLITAXEL (PAK li TAX el) is a chemotherapy drug. It targets fast dividing cells, like cancer cells, and causes these cells to die. This medicine is used to treat ovarian cancer, breast cancer, lung cancer, Kaposi's sarcoma, and other cancers. This medicine may be used for other purposes; ask your health care provider or pharmacist if you have questions. COMMON BRAND NAME(S): Onxol, Taxol What should I tell my health care provider before  I take this medicine? They need to know if you have any of these conditions:  history of irregular heartbeat  liver disease  low blood counts, like low white cell, platelet, or red cell counts  lung or breathing disease, like asthma  tingling of the fingers or toes, or other nerve disorder  an unusual or allergic reaction to paclitaxel, alcohol, polyoxyethylated castor oil, other chemotherapy, other medicines, foods, dyes, or preservatives  pregnant or trying to get pregnant  breast-feeding How should I  use this medicine? This drug is given as an infusion into a vein. It is administered in a hospital or clinic by a specially trained health care professional. Talk to your pediatrician regarding the use of this medicine in children. Special care may be needed. Overdosage: If you think you have taken too much of this medicine contact a poison control center or emergency room at once. NOTE: This medicine is only for you. Do not share this medicine with others. What if I miss a dose? It is important not to miss your dose. Call your doctor or health care professional if you are unable to keep an appointment. What may interact with this medicine? Do not take this medicine with any of the following medications:  disulfiram  metronidazole This medicine may also interact with the following medications:  antiviral medicines for hepatitis, HIV or AIDS  certain antibiotics like erythromycin and clarithromycin  certain medicines for fungal infections like ketoconazole and itraconazole  certain medicines for seizures like carbamazepine, phenobarbital, phenytoin  gemfibrozil  nefazodone  rifampin  St. John's wort This list may not describe all possible interactions. Give your health care provider a list of all the medicines, herbs, non-prescription drugs, or dietary supplements you use. Also tell them if you smoke, drink alcohol, or use illegal drugs. Some items may interact with your medicine. What should I watch for while using this medicine? Your condition will be monitored carefully while you are receiving this medicine. You will need important blood work done while you are taking this medicine. This medicine can cause serious allergic reactions. To reduce your risk you will need to take other medicine(s) before treatment with this medicine. If you experience allergic reactions like skin rash, itching or hives, swelling of the face, lips, or tongue, tell your doctor or health care professional  right away. In some cases, you may be given additional medicines to help with side effects. Follow all directions for their use. This drug may make you feel generally unwell. This is not uncommon, as chemotherapy can affect healthy cells as well as cancer cells. Report any side effects. Continue your course of treatment even though you feel ill unless your doctor tells you to stop. Call your doctor or health care professional for advice if you get a fever, chills or sore throat, or other symptoms of a cold or flu. Do not treat yourself. This drug decreases your body's ability to fight infections. Try to avoid being around people who are sick. This medicine may increase your risk to bruise or bleed. Call your doctor or health care professional if you notice any unusual bleeding. Be careful brushing and flossing your teeth or using a toothpick because you may get an infection or bleed more easily. If you have any dental work done, tell your dentist you are receiving this medicine. Avoid taking products that contain aspirin, acetaminophen, ibuprofen, naproxen, or ketoprofen unless instructed by your doctor. These medicines may hide a fever. Do not become pregnant while  taking this medicine. Women should inform their doctor if they wish to become pregnant or think they might be pregnant. There is a potential for serious side effects to an unborn child. Talk to your health care professional or pharmacist for more information. Do not breast-feed an infant while taking this medicine. Men are advised not to father a child while receiving this medicine. This product may contain alcohol. Ask your pharmacist or healthcare provider if this medicine contains alcohol. Be sure to tell all healthcare providers you are taking this medicine. Certain medicines, like metronidazole and disulfiram, can cause an unpleasant reaction when taken with alcohol. The reaction includes flushing, headache, nausea, vomiting, sweating, and  increased thirst. The reaction can last from 30 minutes to several hours. What side effects may I notice from receiving this medicine? Side effects that you should report to your doctor or health care professional as soon as possible:  allergic reactions like skin rash, itching or hives, swelling of the face, lips, or tongue  breathing problems  changes in vision  fast, irregular heartbeat  high or low blood pressure  mouth sores  pain, tingling, numbness in the hands or feet  signs of decreased platelets or bleeding - bruising, pinpoint red spots on the skin, black, tarry stools, blood in the urine  signs of decreased red blood cells - unusually weak or tired, feeling faint or lightheaded, falls  signs of infection - fever or chills, cough, sore throat, pain or difficulty passing urine  signs and symptoms of liver injury like dark yellow or brown urine; general ill feeling or flu-like symptoms; light-colored stools; loss of appetite; nausea; right upper belly pain; unusually weak or tired; yellowing of the eyes or skin  swelling of the ankles, feet, hands  unusually slow heartbeat Side effects that usually do not require medical attention (report to your doctor or health care professional if they continue or are bothersome):  diarrhea  hair loss  loss of appetite  muscle or joint pain  nausea, vomiting  pain, redness, or irritation at site where injected  tiredness This list may not describe all possible side effects. Call your doctor for medical advice about side effects. You may report side effects to FDA at 1-800-FDA-1088. Where should I keep my medicine? This drug is given in a hospital or clinic and will not be stored at home. NOTE: This sheet is a summary. It may not cover all possible information. If you have questions about this medicine, talk to your doctor, pharmacist, or health care provider.  2020 Elsevier/Gold Standard (2017-03-25 13:14:55) Bevacizumab  injection What is this medicine? BEVACIZUMAB (be va SIZ yoo mab) is a monoclonal antibody. It is used to treat many types of cancer. This medicine may be used for other purposes; ask your health care provider or pharmacist if you have questions. COMMON BRAND NAME(S): Avastin, MVASI, Zirabev What should I tell my health care provider before I take this medicine? They need to know if you have any of these conditions:  diabetes  heart disease  high blood pressure  history of coughing up blood  prior anthracycline chemotherapy (e.g., doxorubicin, daunorubicin, epirubicin)  recent or ongoing radiation therapy  recent or planning to have surgery  stroke  an unusual or allergic reaction to bevacizumab, hamster proteins, mouse proteins, other medicines, foods, dyes, or preservatives  pregnant or trying to get pregnant  breast-feeding How should I use this medicine? This medicine is for infusion into a vein. It is given by a  health care professional in a hospital or clinic setting. Talk to your pediatrician regarding the use of this medicine in children. Special care may be needed. Overdosage: If you think you have taken too much of this medicine contact a poison control center or emergency room at once. NOTE: This medicine is only for you. Do not share this medicine with others. What if I miss a dose? It is important not to miss your dose. Call your doctor or health care professional if you are unable to keep an appointment. What may interact with this medicine? Interactions are not expected. This list may not describe all possible interactions. Give your health care provider a list of all the medicines, herbs, non-prescription drugs, or dietary supplements you use. Also tell them if you smoke, drink alcohol, or use illegal drugs. Some items may interact with your medicine. What should I watch for while using this medicine? Your condition will be monitored carefully while you are  receiving this medicine. You will need important blood work and urine testing done while you are taking this medicine. This medicine may increase your risk to bruise or bleed. Call your doctor or health care professional if you notice any unusual bleeding. This medicine should be started at least 28 days following major surgery and the site of the surgery should be totally healed. Check with your doctor before scheduling dental work or surgery while you are receiving this treatment. Talk to your doctor if you have recently had surgery or if you have a wound that has not healed. Do not become pregnant while taking this medicine or for 6 months after stopping it. Women should inform their doctor if they wish to become pregnant or think they might be pregnant. There is a potential for serious side effects to an unborn child. Talk to your health care professional or pharmacist for more information. Do not breast-feed an infant while taking this medicine and for 6 months after the last dose. This medicine has caused ovarian failure in some women. This medicine may interfere with the ability to have a child. You should talk to your doctor or health care professional if you are concerned about your fertility. What side effects may I notice from receiving this medicine? Side effects that you should report to your doctor or health care professional as soon as possible:  allergic reactions like skin rash, itching or hives, swelling of the face, lips, or tongue  chest pain or chest tightness  chills  coughing up blood  high fever  seizures  severe constipation  signs and symptoms of bleeding such as bloody or black, tarry stools; red or dark-brown urine; spitting up blood or brown material that looks like coffee grounds; red spots on the skin; unusual bruising or bleeding from the eye, gums, or nose  signs and symptoms of a blood clot such as breathing problems; chest pain; severe, sudden headache;  pain, swelling, warmth in the leg  signs and symptoms of a stroke like changes in vision; confusion; trouble speaking or understanding; severe headaches; sudden numbness or weakness of the face, arm or leg; trouble walking; dizziness; loss of balance or coordination  stomach pain  sweating  swelling of legs or ankles  vomiting  weight gain Side effects that usually do not require medical attention (report to your doctor or health care professional if they continue or are bothersome):  back pain  changes in taste  decreased appetite  dry skin  nausea  tiredness This list may  not describe all possible side effects. Call your doctor for medical advice about side effects. You may report side effects to FDA at 1-800-FDA-1088. Where should I keep my medicine? This drug is given in a hospital or clinic and will not be stored at home. NOTE: This sheet is a summary. It may not cover all possible information. If you have questions about this medicine, talk to your doctor, pharmacist, or health care provider.  2020 Elsevier/Gold Standard (2016-07-19 14:33:29)

## 2019-06-08 ENCOUNTER — Encounter: Payer: Self-pay | Admitting: *Deleted

## 2019-06-08 NOTE — Progress Notes (Signed)
[place] Work  Clinical Social Work was referred by Ar[employer] for assessment of psychosocial needs; specifically disability.  Clinical Social Worker contacted patient by phone  to offer support and assess for needs.  Jennifer Santos reported she needed assistance applying for disability.  CSW completed referral form and sent to [facility] disability program for assistance.   Gwinda Maine, LCSW  Clinical Social Worker [facility]

## 2019-06-11 ENCOUNTER — Telehealth: Payer: Self-pay | Admitting: *Deleted

## 2019-06-11 NOTE — Telephone Encounter (Signed)
-----   Message from Heath Lark, MD sent at 06/11/2019  8:09 AM EST ----- Regarding: can you call if she purchased BP machine and checked her BP recently?

## 2019-06-11 NOTE — Telephone Encounter (Signed)
Telephone call to patient. Left message for a return call.

## 2019-06-24 ENCOUNTER — Other Ambulatory Visit: Payer: Self-pay | Admitting: Hematology and Oncology

## 2019-06-24 ENCOUNTER — Telehealth: Payer: Self-pay

## 2019-06-24 DIAGNOSIS — C530 Malignant neoplasm of endocervix: Secondary | ICD-10-CM

## 2019-06-24 DIAGNOSIS — J329 Chronic sinusitis, unspecified: Secondary | ICD-10-CM | POA: Insufficient documentation

## 2019-06-24 DIAGNOSIS — J018 Other acute sinusitis: Secondary | ICD-10-CM

## 2019-06-24 MED ORDER — AZITHROMYCIN 500 MG PO TABS: 500.0000 mg | ORAL_TABLET | Freq: Every day | ORAL | 0 refills | Status: DC

## 2019-06-24 NOTE — Telephone Encounter (Signed)
Pt called to report sinus pressure X 1 week.  Denies any fever.  Reports increase facial pain/pressure, along with yellow drainage when blowing nose.    Pt under active chemotherapy.  RN encouraged fluids.  Will review with MD for further recommendations.

## 2019-06-24 NOTE — Telephone Encounter (Signed)
Advise her not to smoke I will order antibiotics just this one time without evaluation because of COVID restriction

## 2019-06-24 NOTE — Telephone Encounter (Signed)
RN informed patient, patient verbalized appreciation.  Pt also reports she has stopped smoking X 3 weeks.

## 2019-06-28 ENCOUNTER — Ambulatory Visit: Payer: BLUE CROSS/BLUE SHIELD | Admitting: Hematology and Oncology

## 2019-06-28 ENCOUNTER — Other Ambulatory Visit: Payer: BLUE CROSS/BLUE SHIELD

## 2019-06-28 ENCOUNTER — Inpatient Hospital Stay (HOSPITAL_BASED_OUTPATIENT_CLINIC_OR_DEPARTMENT_OTHER): Payer: BLUE CROSS/BLUE SHIELD | Admitting: Hematology and Oncology

## 2019-06-28 ENCOUNTER — Inpatient Hospital Stay: Payer: BLUE CROSS/BLUE SHIELD

## 2019-06-28 ENCOUNTER — Ambulatory Visit: Payer: BLUE CROSS/BLUE SHIELD

## 2019-06-28 ENCOUNTER — Other Ambulatory Visit: Payer: Self-pay

## 2019-06-28 DIAGNOSIS — Z5111 Encounter for antineoplastic chemotherapy: Secondary | ICD-10-CM | POA: Diagnosis not present

## 2019-06-28 DIAGNOSIS — C530 Malignant neoplasm of endocervix: Secondary | ICD-10-CM | POA: Diagnosis not present

## 2019-06-28 DIAGNOSIS — Z7189 Other specified counseling: Secondary | ICD-10-CM

## 2019-06-28 DIAGNOSIS — N1330 Unspecified hydronephrosis: Secondary | ICD-10-CM | POA: Diagnosis not present

## 2019-06-28 DIAGNOSIS — D61818 Other pancytopenia: Secondary | ICD-10-CM | POA: Diagnosis not present

## 2019-06-28 DIAGNOSIS — C531 Malignant neoplasm of exocervix: Secondary | ICD-10-CM

## 2019-06-28 LAB — CBC WITH DIFFERENTIAL (CANCER CENTER ONLY)
Abs Immature Granulocytes: 0.04 10*3/uL (ref 0.00–0.07)
Basophils Absolute: 0 10*3/uL (ref 0.0–0.1)
Basophils Relative: 0 %
Eosinophils Absolute: 0 10*3/uL (ref 0.0–0.5)
Eosinophils Relative: 0 %
HCT: 28.8 % — ABNORMAL LOW (ref 36.0–46.0)
Hemoglobin: 9.7 g/dL — ABNORMAL LOW (ref 12.0–15.0)
Immature Granulocytes: 1 %
Lymphocytes Relative: 4 %
Lymphs Abs: 0.3 10*3/uL — ABNORMAL LOW (ref 0.7–4.0)
MCH: 29.4 pg (ref 26.0–34.0)
MCHC: 33.7 g/dL (ref 30.0–36.0)
MCV: 87.3 fL (ref 80.0–100.0)
Monocytes Absolute: 0.1 10*3/uL (ref 0.1–1.0)
Monocytes Relative: 2 %
Neutro Abs: 5.5 10*3/uL (ref 1.7–7.7)
Neutrophils Relative %: 93 %
Platelet Count: 89 10*3/uL — ABNORMAL LOW (ref 150–400)
RBC: 3.3 MIL/uL — ABNORMAL LOW (ref 3.87–5.11)
RDW: 12.7 % (ref 11.5–15.5)
WBC Count: 5.9 10*3/uL (ref 4.0–10.5)
nRBC: 0 % (ref 0.0–0.2)

## 2019-06-28 LAB — CMP (CANCER CENTER ONLY)
ALT: 14 U/L (ref 0–44)
AST: 15 U/L (ref 15–41)
Albumin: 3.3 g/dL — ABNORMAL LOW (ref 3.5–5.0)
Alkaline Phosphatase: 90 U/L (ref 38–126)
Anion gap: 12 (ref 5–15)
BUN: 15 mg/dL (ref 6–20)
CO2: 20 mmol/L — ABNORMAL LOW (ref 22–32)
Calcium: 9.2 mg/dL (ref 8.9–10.3)
Chloride: 104 mmol/L (ref 98–111)
Creatinine: 0.72 mg/dL (ref 0.44–1.00)
GFR, Est AFR Am: >60 mL/min (ref >60)
GFR, Estimated: >60 mL/min (ref >60)
Glucose, Bld: 104 mg/dL — ABNORMAL HIGH (ref 70–99)
Potassium: 4.3 mmol/L (ref 3.5–5.1)
Sodium: 136 mmol/L (ref 135–145)
Total Bilirubin: <0.2 mg/dL — ABNORMAL LOW (ref 0.3–1.2)
Total Protein: 7.2 g/dL (ref 6.5–8.1)

## 2019-06-28 MED ORDER — SODIUM CHLORIDE 0.9 % IV SOLN
Freq: Once | INTRAVENOUS | Status: AC
  Administered 2019-06-28: 11:00:00 via INTRAVENOUS
  Filled 2019-06-28: qty 5

## 2019-06-28 MED ORDER — FAMOTIDINE IN NACL 20-0.9 MG/50ML-% IV SOLN
20.0000 mg | Freq: Once | INTRAVENOUS | Status: AC
  Administered 2019-06-28: 10:00:00 20 mg via INTRAVENOUS

## 2019-06-28 MED ORDER — FAMOTIDINE IN NACL 20-0.9 MG/50ML-% IV SOLN
INTRAVENOUS | Status: AC
  Filled 2019-06-28: qty 50

## 2019-06-28 MED ORDER — SODIUM CHLORIDE 0.9% FLUSH
10.0000 mL | Freq: Once | INTRAVENOUS | Status: AC
  Administered 2019-06-28: 09:00:00 10 mL
  Filled 2019-06-28: qty 10

## 2019-06-28 MED ORDER — HEPARIN SOD (PORK) LOCK FLUSH 100 UNIT/ML IV SOLN
500.0000 [IU] | Freq: Once | INTRAVENOUS | Status: AC | PRN
  Administered 2019-06-28: 500 [IU]
  Filled 2019-06-28: qty 5

## 2019-06-28 MED ORDER — SODIUM CHLORIDE 0.9 % IV SOLN
331.0000 mg | Freq: Once | INTRAVENOUS | Status: AC
  Administered 2019-06-28: 330 mg via INTRAVENOUS
  Filled 2019-06-28: qty 33

## 2019-06-28 MED ORDER — SODIUM CHLORIDE 0.9 % IV SOLN
15.0000 mg/kg | Freq: Once | INTRAVENOUS | Status: AC
  Administered 2019-06-28: 800 mg via INTRAVENOUS
  Filled 2019-06-28: qty 32

## 2019-06-28 MED ORDER — PALONOSETRON HCL INJECTION 0.25 MG/5ML
INTRAVENOUS | Status: AC
  Filled 2019-06-28: qty 5

## 2019-06-28 MED ORDER — SODIUM CHLORIDE 0.9% FLUSH
10.0000 mL | INTRAVENOUS | Status: DC | PRN
  Administered 2019-06-28: 10 mL
  Filled 2019-06-28: qty 10

## 2019-06-28 MED ORDER — SODIUM CHLORIDE 0.9 % IV SOLN
Freq: Once | INTRAVENOUS | Status: AC
  Administered 2019-06-28: 10:00:00 via INTRAVENOUS
  Filled 2019-06-28: qty 250

## 2019-06-28 MED ORDER — DIPHENHYDRAMINE HCL 50 MG/ML IJ SOLN
50.0000 mg | Freq: Once | INTRAMUSCULAR | Status: AC
  Administered 2019-06-28: 50 mg via INTRAVENOUS

## 2019-06-28 MED ORDER — SODIUM CHLORIDE 0.9 % IV SOLN
140.0000 mg/m2 | Freq: Once | INTRAVENOUS | Status: AC
  Administered 2019-06-28: 216 mg via INTRAVENOUS
  Filled 2019-06-28: qty 36

## 2019-06-28 MED ORDER — DIPHENHYDRAMINE HCL 50 MG/ML IJ SOLN
INTRAMUSCULAR | Status: AC
  Filled 2019-06-28: qty 1

## 2019-06-28 MED ORDER — PALONOSETRON HCL INJECTION 0.25 MG/5ML
0.2500 mg | Freq: Once | INTRAVENOUS | Status: AC
  Administered 2019-06-28: 10:00:00 0.25 mg via INTRAVENOUS

## 2019-06-28 NOTE — Patient Instructions (Signed)
[facility] Discharge Instructions for Patients Receiving Chemotherapy  Today you received the following chemotherapy agents:  Paclitaxol & Carboplatin & immunotherapy agent :  Bevacizumab   To help prevent nausea and vomiting after your treatment, we encourage you to take your nausea medication as needed. If you develop nausea and vomiting that is not controlled by your nausea medication, call the clinic.   BELOW ARE SYMPTOMS THAT SHOULD BE REPORTED IMMEDIATELY:  *FEVER GREATER THAN 100.5 F  *CHILLS WITH OR WITHOUT FEVER  NAUSEA AND VOMITING THAT IS NOT CONTROLLED WITH YOUR NAUSEA MEDICATION  *UNUSUAL SHORTNESS OF BREATH  *UNUSUAL BRUISING OR BLEEDING  TENDERNESS IN MOUTH AND THROAT WITH OR WITHOUT PRESENCE OF ULCERS  *URINARY PROBLEMS  *BOWEL PROBLEMS  UNUSUAL RASH Items with * indicate a potential emergency and should be followed up as soon as possible.  Feel free to call the clinic should you have any questions or concerns. The clinic phone number is [phone].  Please show the [place] at check-in to the Emergency Department and triage nurse.

## 2019-06-29 ENCOUNTER — Encounter: Payer: Self-pay | Admitting: Hematology and Oncology

## 2019-06-29 NOTE — Progress Notes (Signed)
[place] OFFICE PROGRESS NOTE  Patient Care Team: Marguerita Merles, MD as PCP - General (Family Medicine) Gae Dry, MD as Referring Physician (Obstetrics and Gynecology) Clent Jacks, RN as Registered Nurse Awanda Mink, Craige Cotta, RN as Oncology Nurse Navigator (Oncology)  ASSESSMENT & PLAN:  Cervical ca [facility]) Overall, she tolerated treatment poorly She has lost a lot of weight due to reduced appetite She is also developing significant changes in her blood work I plan to reduce the dose of chemotherapy further I recommend minimum 3 cycles of treatment before repeat imaging study  Pancytopenia, acquired [facility]) She has significant pancytopenia due to treatment She has also lost weight I will reduce the dose of chemotherapy She is not symptomatic from pancytopenia and does not need transfusion support  Hydronephrosis of right kidney So far, her renal function is stable Monitor closely   No orders of the defined types were placed in this encounter.   INTERVAL HISTORY: Please see below for problem oriented charting. She is seen prior to cycle 2 of treatment She tolerated treatment poorly with loss of appetite and progressive, significant weight loss She denies nausea or changes in bowel habits No peripheral neuropathy The patient has quit smoking The patient denies any recent signs or symptoms of bleeding such as spontaneous epistaxis, hematuria or hematochezia. No recent infection, fever or chills  SUMMARY OF ONCOLOGIC HISTORY: Oncology History Overview Note  PD-L1 0%   Cervical ca ([place])  11/03/2018 Initial Diagnosis   She presented with postmenopausal bleeding since 2019 (at least 1 year) and weight loss. Had prior abnormal PAP smear, last done in 2010   11/04/2018 Pathology Results   Endometrium, biopsy - INVASIVE MODERATELY DIFFERENTIATED SQUAMOUS CELL CARCINOMA. SEE NOTE Diagnosis Note Squamous cell carcinoma arises in a background of high-grade  dysplasia with possible glandular involvement. Tumor cells are positive for CK5/6, consistent with the above diagnosis. Immunohistochemical stain for p16 is diffusely positive in the tumor cells.   11/17/2018 Cancer Staging   Staging form: Cervix Uteri, AJCC 8th Edition - Clinical: FIGO Stage IIIB, calculated as Stage IVB (cT3b, cN0, cM1) - Signed by Heath Lark, MD on 06/01/2019   11/20/2018 PET scan   IMPRESSION: 1. Cervical lesion with direct extra cervical extension posterolaterally on the right is markedly hypermetabolic consistent with known neoplasm. 2. 7 x 11 mm posterior right pelvic irregular nodule is hypermetabolic consistent with metastatic disease. 3. Para-aortic soft tissue in the region of the bifurcation and 8 mm short axis left pelvic sidewall lymph node show marked hypermetabolism, consistent with metastatic involvement. 4. Low level FDG uptake identified in a unenlarged AP window lymph node of the mediastinum with focal FDG uptake identified in each hilum. While no underlying hilar lymph nodes evident on this noncontrast CT exam, tiny lymph nodes were visible in the hilar regions on diagnostic CT of 11/17/2018. While this may be reactive given the low level uptake, metastatic disease cannot be definitively excluded. 5. Focus of hypermetabolic activity in the spinal canal at the T11-12 level. No underlying mass lesion evident on noncontrast CT imaging. Thoracic spine MRI without and with contrast may be warranted to further evaluate. 6. Stable moderate right hydroureteronephrosis.   11/25/2018 Procedure   Placement of single lumen port a cath via right internal jugular vein. The catheter tip lies at the cavo-atrial junction. A power injectable port a cath was placed and is ready for immediate use.    11/26/2018 Imaging   MRI spine Negative for metastatic  disease. No abnormality to correlate with potential lesion within the spinal canal at T11-12 as seen on prior CT is  identified.  Remote, mild superior endplate compression fracture of T12.  Widely patent central canal and foramina throughout.  Right hydronephrosis as seen on prior studies.   11/27/2018 - 01/13/2019 Chemotherapy   The patient had weekly cisplatin with radiation. Last 3 doses were delayed and dose modified due to pancytopenia   05/24/2019 PET scan   1. Mixed appearance, with they were resolution of accentuated activity in the cervix and paracervical tissues as well as resolved pelvic adenopathy; but with at least two and possibly three new hypermetabolic lesions along the liver capsule suspicious for potential metastatic lesions. 2. New vertical activity in both sacral ala, with appearance and pattern favoring sacral insufficiency fracture. 3. Other imaging findings of potential clinical significance: Aortic Atherosclerosis (ICD10-I70.0). Left anterior descending coronary artery atherosclerotic calcification. Presacral density is likely treatment related. Stable T12 anterior wedge compression fracture.    Genetic Testing   Patient has genetic testing done for PD-L1 on accession [id number]. Results revealed patient has the following: PD-L1 0%     REVIEW OF SYSTEMS:   Constitutional: Denies fevers, chills  Eyes: Denies blurriness of vision Ears, nose, mouth, throat, and face: Denies mucositis or sore throat Respiratory: Denies cough, dyspnea or wheezes Cardiovascular: Denies palpitation, chest discomfort or lower extremity swelling Gastrointestinal:  Denies nausea, heartburn or change in bowel habits Skin: Denies abnormal skin rashes Lymphatics: Denies new lymphadenopathy or easy bruising Neurological:Denies numbness, tingling or new weaknesses Behavioral/Psych: Mood is stable, no new changes  All other systems were reviewed with the patient and are negative.  I have reviewed the past medical history, past surgical history, social history and family history with the patient and  they are unchanged from previous note.  ALLERGIES:  has No Known Allergies.  MEDICATIONS:  Current Outpatient Medications  Medication Sig Dispense Refill  . acetaminophen (TYLENOL) 500 MG tablet Take 1,000 mg by mouth every 6 (six) hours as needed (pain).    Marland Kitchen dexamethasone (DECADRON) 4 MG tablet Take 2 tabs at the night before and 2 tabs the morning of chemotherapy, every 3 weeks, by mouth. Please dispense 24 tabs for total 6 cycles 24 tablet 0  . lidocaine-prilocaine (EMLA) cream Apply 1 application topically as needed.    . ondansetron (ZOFRAN) 8 MG tablet Take 8 mg by mouth every 8 (eight) hours as needed.    . prochlorperazine (COMPAZINE) 10 MG tablet Take 10 mg by mouth every 6 (six) hours as needed.    Marland Kitchen tiZANidine (ZANAFLEX) 2 MG tablet Take 2 mg by mouth at bedtime as needed.      No current facility-administered medications for this visit.     PHYSICAL EXAMINATION: ECOG PERFORMANCE STATUS: 1 - Symptomatic but completely ambulatory  Vitals:   06/28/19 0940  BP: 123/87  Pulse: 91  Resp: 18  Temp: 98.2 F (36.8 C)  SpO2: 100%   Filed Weights   06/28/19 0940  Weight: 111 lb 11.2 oz (50.7 kg)    GENERAL:alert, no distress and comfortable SKIN: skin color, texture, turgor are normal, no rashes or significant lesions EYES: normal, Conjunctiva are pink and non-injected, sclera clear OROPHARYNX:no exudate, no erythema and lips, buccal mucosa, and tongue normal  NECK: supple, thyroid normal size, non-tender, without nodularity LYMPH:  no palpable lymphadenopathy in the cervical, axillary or inguinal LUNGS: clear to auscultation and percussion with normal breathing effort HEART: regular rate & rhythm and  no murmurs and no lower extremity edema ABDOMEN:abdomen soft, non-tender and normal bowel sounds Musculoskeletal:no cyanosis of digits and no clubbing  NEURO: alert & oriented x 3 with fluent speech, no focal motor/sensory deficits  LABORATORY DATA:  I have reviewed the  data as listed    Component Value Date/Time   NA 136 06/28/2019 0904   K 4.3 06/28/2019 0904   CL 104 06/28/2019 0904   CO2 20 (L) 06/28/2019 0904   GLUCOSE 104 (H) 06/28/2019 0904   BUN 15 06/28/2019 0904   CREATININE 0.72 06/28/2019 0904   CALCIUM 9.2 06/28/2019 0904   PROT 7.2 06/28/2019 0904   ALBUMIN 3.3 (L) 06/28/2019 0904   AST 15 06/28/2019 0904   ALT 14 06/28/2019 0904   ALKPHOS 90 06/28/2019 0904   BILITOT <0.2 (L) 06/28/2019 0904   GFRNONAA >60 06/28/2019 0904   GFRAA >60 06/28/2019 0904    No results found for: SPEP, UPEP  Lab Results  Component Value Date   WBC 5.9 06/28/2019   NEUTROABS 5.5 06/28/2019   HGB 9.7 (L) 06/28/2019   HCT 28.8 (L) 06/28/2019   MCV 87.3 06/28/2019   PLT 89 (L) 06/28/2019      Chemistry      Component Value Date/Time   NA 136 06/28/2019 0904   K 4.3 06/28/2019 0904   CL 104 06/28/2019 0904   CO2 20 (L) 06/28/2019 0904   BUN 15 06/28/2019 0904   CREATININE 0.72 06/28/2019 0904      Component Value Date/Time   CALCIUM 9.2 06/28/2019 0904   ALKPHOS 90 06/28/2019 0904   AST 15 06/28/2019 0904   ALT 14 06/28/2019 0904   BILITOT <0.2 (L) 06/28/2019 0904      All questions were answered. The patient knows to call the clinic with any problems, questions or concerns. No barriers to learning was detected.  I spent 25 minutes counseling the patient face to face. The total time spent in the appointment was 40 minutes and more than 50% was on counseling and review of test results  Heath Lark, MD 06/29/2019 8:52 AM

## 2019-06-29 NOTE — Assessment & Plan Note (Signed)
Overall, she tolerated treatment poorly She has lost a lot of weight due to reduced appetite She is also developing significant changes in her blood work I plan to reduce the dose of chemotherapy further I recommend minimum 3 cycles of treatment before repeat imaging study

## 2019-06-29 NOTE — Assessment & Plan Note (Addendum)
She has significant pancytopenia due to treatment She has also lost weight I will reduce the dose of chemotherapy She is not symptomatic from pancytopenia and does not need transfusion support

## 2019-06-29 NOTE — Assessment & Plan Note (Signed)
So far, her renal function is stable Monitor closely

## 2019-06-30 ENCOUNTER — Telehealth: Payer: Self-pay | Admitting: Hematology and Oncology

## 2019-06-30 NOTE — Telephone Encounter (Signed)
Scheduled appt per 11/24 sch message - unable to reach pt - left message with appt date and time

## 2019-07-20 ENCOUNTER — Inpatient Hospital Stay: Payer: BLUE CROSS/BLUE SHIELD | Attending: Gynecologic Oncology | Admitting: Hematology and Oncology

## 2019-07-20 ENCOUNTER — Inpatient Hospital Stay: Payer: BLUE CROSS/BLUE SHIELD

## 2019-07-20 ENCOUNTER — Other Ambulatory Visit: Payer: Self-pay | Admitting: Hematology and Oncology

## 2019-07-20 ENCOUNTER — Encounter: Payer: Self-pay | Admitting: Hematology and Oncology

## 2019-07-20 ENCOUNTER — Other Ambulatory Visit: Payer: Self-pay

## 2019-07-20 VITALS — BP 163/91 | HR 61 | Temp 97.8°F | Resp 18 | Ht 66.5 in | Wt 115.1 lb

## 2019-07-20 DIAGNOSIS — D61818 Other pancytopenia: Secondary | ICD-10-CM

## 2019-07-20 DIAGNOSIS — D539 Nutritional anemia, unspecified: Secondary | ICD-10-CM | POA: Insufficient documentation

## 2019-07-20 DIAGNOSIS — Z5111 Encounter for antineoplastic chemotherapy: Secondary | ICD-10-CM | POA: Diagnosis present

## 2019-07-20 DIAGNOSIS — Z79899 Other long term (current) drug therapy: Secondary | ICD-10-CM | POA: Diagnosis not present

## 2019-07-20 DIAGNOSIS — C531 Malignant neoplasm of exocervix: Secondary | ICD-10-CM

## 2019-07-20 DIAGNOSIS — C530 Malignant neoplasm of endocervix: Secondary | ICD-10-CM | POA: Diagnosis not present

## 2019-07-20 DIAGNOSIS — C539 Malignant neoplasm of cervix uteri, unspecified: Secondary | ICD-10-CM | POA: Insufficient documentation

## 2019-07-20 DIAGNOSIS — Z7189 Other specified counseling: Secondary | ICD-10-CM

## 2019-07-20 DIAGNOSIS — K5909 Other constipation: Secondary | ICD-10-CM | POA: Diagnosis not present

## 2019-07-20 LAB — CBC WITH DIFFERENTIAL/PLATELET
Abs Immature Granulocytes: 0.01 10*3/uL (ref 0.00–0.07)
Basophils Absolute: 0 10*3/uL (ref 0.0–0.1)
Basophils Relative: 0 %
Eosinophils Absolute: 0 10*3/uL (ref 0.0–0.5)
Eosinophils Relative: 0 %
HCT: 27.9 % — ABNORMAL LOW (ref 36.0–46.0)
Hemoglobin: 9.2 g/dL — ABNORMAL LOW (ref 12.0–15.0)
Immature Granulocytes: 0 %
Lymphocytes Relative: 17 %
Lymphs Abs: 0.5 10*3/uL — ABNORMAL LOW (ref 0.7–4.0)
MCH: 30.2 pg (ref 26.0–34.0)
MCHC: 33 g/dL (ref 30.0–36.0)
MCV: 91.5 fL (ref 80.0–100.0)
Monocytes Absolute: 0.3 10*3/uL (ref 0.1–1.0)
Monocytes Relative: 10 %
Neutro Abs: 2.3 10*3/uL (ref 1.7–7.7)
Neutrophils Relative %: 73 %
Platelets: 54 10*3/uL — ABNORMAL LOW (ref 150–400)
RBC: 3.05 MIL/uL — ABNORMAL LOW (ref 3.87–5.11)
RDW: 17.2 % — ABNORMAL HIGH (ref 11.5–15.5)
WBC: 3.1 10*3/uL — ABNORMAL LOW (ref 4.0–10.5)
nRBC: 0 % (ref 0.0–0.2)

## 2019-07-20 LAB — IRON AND TIBC
Iron: 101 ug/dL (ref 41–142)
Saturation Ratios: 31 % (ref 21–57)
TIBC: 323 ug/dL (ref 236–444)
UIBC: 221 ug/dL (ref 120–384)

## 2019-07-20 LAB — CMP (CANCER CENTER ONLY)
ALT: 12 U/L (ref 0–44)
AST: 17 U/L (ref 15–41)
Albumin: 3.8 g/dL (ref 3.5–5.0)
Alkaline Phosphatase: 83 U/L (ref 38–126)
Anion gap: 14 (ref 5–15)
BUN: 19 mg/dL (ref 6–20)
CO2: 22 mmol/L (ref 22–32)
Calcium: 9.7 mg/dL (ref 8.9–10.3)
Chloride: 102 mmol/L (ref 98–111)
Creatinine: 0.84 mg/dL (ref 0.44–1.00)
GFR, Est AFR Am: >60 mL/min (ref >60)
GFR, Estimated: >60 mL/min (ref >60)
Glucose, Bld: 122 mg/dL — ABNORMAL HIGH (ref 70–99)
Potassium: 4.3 mmol/L (ref 3.5–5.1)
Sodium: 138 mmol/L (ref 135–145)
Total Bilirubin: 0.2 mg/dL — ABNORMAL LOW (ref 0.3–1.2)
Total Protein: 7.2 g/dL (ref 6.5–8.1)

## 2019-07-20 LAB — VITAMIN B12: Vitamin B-12: 278 pg/mL (ref 180–914)

## 2019-07-20 LAB — FERRITIN: Ferritin: 544 ng/mL — ABNORMAL HIGH (ref 11–307)

## 2019-07-20 LAB — SEDIMENTATION RATE: Sed Rate: 62 mm/hr — ABNORMAL HIGH (ref 0–22)

## 2019-07-20 LAB — TOTAL PROTEIN, URINE DIPSTICK

## 2019-07-20 MED ORDER — SODIUM CHLORIDE 0.9% FLUSH
10.0000 mL | Freq: Once | INTRAVENOUS | Status: AC
  Administered 2019-07-20: 10 mL
  Filled 2019-07-20: qty 10

## 2019-07-20 NOTE — Assessment & Plan Note (Signed)
We discussed the use of laxatives after treatment to avoid constipation

## 2019-07-20 NOTE — Assessment & Plan Note (Signed)
She has severe pancytopenia likely due to side effects of treatment I will order serum vitamin B12 and iron studies She is not symptomatic and does not need transfusion support I will cancel her treatment today and reschedule to 2 weeks with dose adjustment

## 2019-07-20 NOTE — Progress Notes (Signed)
[place] OFFICE PROGRESS NOTE  Patient Care Team: Marguerita Merles, MD as PCP - General (Family Medicine) Gae Dry, MD as Referring Physician (Obstetrics and Gynecology) Clent Jacks, RN as Registered Nurse Awanda Mink Craige Cotta, RN as Oncology Nurse Navigator (Oncology)  ASSESSMENT & PLAN:  Cervical ca [facility]) She continues to have very poor tolerance to treatment with severe pancytopenia I recommend delaying her treatment until after Christmas I will proceed with further dose adjustment of carboplatin to AUC of 4 She does not need transfusion support After her next treatment, I plan to order repeat imaging study in mid January for objective assessment of response to therapy  Pancytopenia, acquired [facility]) She has severe pancytopenia likely due to side effects of treatment I will order serum vitamin B12 and iron studies She is not symptomatic and does not need transfusion support I will cancel her treatment today and reschedule to 2 weeks with dose adjustment  Other constipation We discussed the use of laxatives after treatment to avoid constipation   Orders Placed This Encounter  Procedures  . NM PET Image Restag (PS) Skull Base To Thigh    Standing Status:   Future    Standing Expiration Date:   07/19/2020    Order Specific Question:   If indicated for the ordered procedure, I authorize the administration of a radiopharmaceutical per Radiology protocol    Answer:   Yes    Order Specific Question:   Preferred imaging location?    Answer:   [facility]    Order Specific Question:   Radiology Contrast Protocol - do NOT remove file path    Answer:   [email or web address]    Order Specific Question:   Is the patient pregnant?    Answer:   No    INTERVAL HISTORY: Please see below for problem oriented charting. She returns for chemotherapy and follow-up She noted extensive bruises lately She denies spontaneous bleeding She had  some constipation after treatment but it has since resolved Denies peripheral neuropathy No recent nausea vomiting She is able to maintain her weight since last time I saw her No recent cough, chest pain or shortness of breath  SUMMARY OF ONCOLOGIC HISTORY: Oncology History Overview Note  PD-L1 0%   Cervical ca ([place])  11/03/2018 Initial Diagnosis   She presented with postmenopausal bleeding since 2019 (at least 1 year) and weight loss. Had prior abnormal PAP smear, last done in 2010   11/04/2018 Pathology Results   Endometrium, biopsy - INVASIVE MODERATELY DIFFERENTIATED SQUAMOUS CELL CARCINOMA. SEE NOTE Diagnosis Note Squamous cell carcinoma arises in a background of high-grade dysplasia with possible glandular involvement. Tumor cells are positive for CK5/6, consistent with the above diagnosis. Immunohistochemical stain for p16 is diffusely positive in the tumor cells.   11/17/2018 Cancer Staging   Staging form: Cervix Uteri, AJCC 8th Edition - Clinical: FIGO Stage IIIB, calculated as Stage IVB (cT3b, cN0, cM1) - Signed by Heath Lark, MD on 06/01/2019   11/20/2018 PET scan   IMPRESSION: 1. Cervical lesion with direct extra cervical extension posterolaterally on the right is markedly hypermetabolic consistent with known neoplasm. 2. 7 x 11 mm posterior right pelvic irregular nodule is hypermetabolic consistent with metastatic disease. 3. Para-aortic soft tissue in the region of the bifurcation and 8 mm short axis left pelvic sidewall lymph node show marked hypermetabolism, consistent with metastatic involvement. 4. Low level FDG uptake identified in a unenlarged AP window lymph node of the mediastinum with focal FDG  uptake identified in each hilum. While no underlying hilar lymph nodes evident on this noncontrast CT exam, tiny lymph nodes were visible in the hilar regions on diagnostic CT of 11/17/2018. While this may be reactive given the low level uptake, metastatic disease cannot be  definitively excluded. 5. Focus of hypermetabolic activity in the spinal canal at the T11-12 level. No underlying mass lesion evident on noncontrast CT imaging. Thoracic spine MRI without and with contrast may be warranted to further evaluate. 6. Stable moderate right hydroureteronephrosis.   11/25/2018 Procedure   Placement of single lumen port a cath via right internal jugular vein. The catheter tip lies at the cavo-atrial junction. A power injectable port a cath was placed and is ready for immediate use.    11/26/2018 Imaging   MRI spine Negative for metastatic disease. No abnormality to correlate with potential lesion within the spinal canal at T11-12 as seen on prior CT is identified.  Remote, mild superior endplate compression fracture of T12.  Widely patent central canal and foramina throughout.  Right hydronephrosis as seen on prior studies.   11/27/2018 - 01/13/2019 Chemotherapy   The patient had weekly cisplatin with radiation. Last 3 doses were delayed and dose modified due to pancytopenia   05/24/2019 PET scan   1. Mixed appearance, with they were resolution of accentuated activity in the cervix and paracervical tissues as well as resolved pelvic adenopathy; but with at least two and possibly three new hypermetabolic lesions along the liver capsule suspicious for potential metastatic lesions. 2. New vertical activity in both sacral ala, with appearance and pattern favoring sacral insufficiency fracture. 3. Other imaging findings of potential clinical significance: Aortic Atherosclerosis (ICD10-I70.0). Left anterior descending coronary artery atherosclerotic calcification. Presacral density is likely treatment related. Stable T12 anterior wedge compression fracture.    Genetic Testing   Patient has genetic testing done for PD-L1 on accession [id number]. Results revealed patient has the following: PD-L1 0%   06/07/2019 -  Chemotherapy   The patient had carboplatin, taxol and  Avastin for chemotherapy treatment.       REVIEW OF SYSTEMS:   Constitutional: Denies fevers, chills or abnormal weight loss Eyes: Denies blurriness of vision Ears, nose, mouth, throat, and face: Denies mucositis or sore throat Respiratory: Denies cough, dyspnea or wheezes Cardiovascular: Denies palpitation, chest discomfort or lower extremity swelling Lymphatics: Denies new lymphadenopathy or easy bruising Behavioral/Psych: Mood is stable, no new changes  All other systems were reviewed with the patient and are negative.  I have reviewed the past medical history, past surgical history, social history and family history with the patient and they are unchanged from previous note.  ALLERGIES:  has No Known Allergies.  MEDICATIONS:  Current Outpatient Medications  Medication Sig Dispense Refill  . acetaminophen (TYLENOL) 500 MG tablet Take 1,000 mg by mouth every 6 (six) hours as needed (pain).    Marland Kitchen dexamethasone (DECADRON) 4 MG tablet Take 2 tabs at the night before and 2 tabs the morning of chemotherapy, every 3 weeks, by mouth. Please dispense 24 tabs for total 6 cycles 24 tablet 0  . lidocaine-prilocaine (EMLA) cream Apply 1 application topically as needed.    . ondansetron (ZOFRAN) 8 MG tablet Take 8 mg by mouth every 8 (eight) hours as needed.    . prochlorperazine (COMPAZINE) 10 MG tablet Take 10 mg by mouth every 6 (six) hours as needed.    Marland Kitchen tiZANidine (ZANAFLEX) 2 MG tablet Take 2 mg by mouth at bedtime as needed.  No current facility-administered medications for this visit.    PHYSICAL EXAMINATION: ECOG PERFORMANCE STATUS: 2 - Symptomatic, <50% confined to bed  Vitals:   07/20/19 0907  BP: (!) 163/91  Pulse: 61  Resp: 18  Temp: 97.8 F (36.6 C)  SpO2: 100%   Filed Weights   07/20/19 0907  Weight: 115 lb 1.6 oz (52.2 kg)    GENERAL:alert, no distress and comfortable.  She looks thin and cachectic SKIN: Noted ecchymosis without petechiae EYES: normal,  Conjunctiva are pink and non-injected, sclera clear OROPHARYNX:no exudate, no erythema and lips, buccal mucosa, and tongue normal  NECK: supple, thyroid normal size, non-tender, without nodularity LYMPH:  no palpable lymphadenopathy in the cervical, axillary or inguinal LUNGS: clear to auscultation and percussion with normal breathing effort HEART: regular rate & rhythm and no murmurs and no lower extremity edema ABDOMEN:abdomen soft, non-tender and normal bowel sounds Musculoskeletal:no cyanosis of digits and no clubbing  NEURO: alert & oriented x 3 with fluent speech, no focal motor/sensory deficits  LABORATORY DATA:  I have reviewed the data as listed    Component Value Date/Time   NA 138 07/20/2019 0807   K 4.3 07/20/2019 0807   CL 102 07/20/2019 0807   CO2 22 07/20/2019 0807   GLUCOSE 122 (H) 07/20/2019 0807   BUN 19 07/20/2019 0807   CREATININE 0.84 07/20/2019 0807   CALCIUM 9.7 07/20/2019 0807   PROT 7.2 07/20/2019 0807   ALBUMIN 3.8 07/20/2019 0807   AST 17 07/20/2019 0807   ALT 12 07/20/2019 0807   ALKPHOS 83 07/20/2019 0807   BILITOT 0.2 (L) 07/20/2019 0807   GFRNONAA >60 07/20/2019 0807   GFRAA >60 07/20/2019 0807    No results found for: SPEP, UPEP  Lab Results  Component Value Date   WBC 3.1 (L) 07/20/2019   NEUTROABS 2.3 07/20/2019   HGB 9.2 (L) 07/20/2019   HCT 27.9 (L) 07/20/2019   MCV 91.5 07/20/2019   PLT 54 (L) 07/20/2019      Chemistry      Component Value Date/Time   NA 138 07/20/2019 0807   K 4.3 07/20/2019 0807   CL 102 07/20/2019 0807   CO2 22 07/20/2019 0807   BUN 19 07/20/2019 0807   CREATININE 0.84 07/20/2019 0807      Component Value Date/Time   CALCIUM 9.7 07/20/2019 0807   ALKPHOS 83 07/20/2019 0807   AST 17 07/20/2019 0807   ALT 12 07/20/2019 0807   BILITOT 0.2 (L) 07/20/2019 0807       All questions were answered. The patient knows to call the clinic with any problems, questions or concerns. No barriers to learning was  detected.  I spent 25 minutes counseling the patient face to face. The total time spent in the appointment was 40 minutes and more than 50% was on counseling and review of test results  Heath Lark, MD 07/20/2019 10:48 AM

## 2019-07-20 NOTE — Assessment & Plan Note (Signed)
She continues to have very poor tolerance to treatment with severe pancytopenia I recommend delaying her treatment until after Christmas I will proceed with further dose adjustment of carboplatin to AUC of 4 She does not need transfusion support After her next treatment, I plan to order repeat imaging study in mid January for objective assessment of response to therapy

## 2019-08-02 ENCOUNTER — Ambulatory Visit: Payer: BLUE CROSS/BLUE SHIELD | Admitting: Nutrition

## 2019-08-02 ENCOUNTER — Inpatient Hospital Stay: Payer: BLUE CROSS/BLUE SHIELD

## 2019-08-02 ENCOUNTER — Other Ambulatory Visit: Payer: Self-pay

## 2019-08-02 ENCOUNTER — Other Ambulatory Visit: Payer: Self-pay | Admitting: Hematology and Oncology

## 2019-08-02 VITALS — BP 145/90 | HR 99 | Temp 98.2°F | Resp 18 | Wt 109.5 lb

## 2019-08-02 DIAGNOSIS — Z7189 Other specified counseling: Secondary | ICD-10-CM

## 2019-08-02 DIAGNOSIS — C539 Malignant neoplasm of cervix uteri, unspecified: Secondary | ICD-10-CM | POA: Diagnosis not present

## 2019-08-02 DIAGNOSIS — C531 Malignant neoplasm of exocervix: Secondary | ICD-10-CM

## 2019-08-02 LAB — CBC WITH DIFFERENTIAL (CANCER CENTER ONLY)
Abs Immature Granulocytes: 0.05 10*3/uL (ref 0.00–0.07)
Basophils Absolute: 0 10*3/uL (ref 0.0–0.1)
Basophils Relative: 0 %
Eosinophils Absolute: 0 10*3/uL (ref 0.0–0.5)
Eosinophils Relative: 0 %
HCT: 30.5 % — ABNORMAL LOW (ref 36.0–46.0)
Hemoglobin: 10.2 g/dL — ABNORMAL LOW (ref 12.0–15.0)
Immature Granulocytes: 2 %
Lymphocytes Relative: 5 %
Lymphs Abs: 0.2 10*3/uL — ABNORMAL LOW (ref 0.7–4.0)
MCH: 31.2 pg (ref 26.0–34.0)
MCHC: 33.4 g/dL (ref 30.0–36.0)
MCV: 93.3 fL (ref 80.0–100.0)
Monocytes Absolute: 0 10*3/uL — ABNORMAL LOW (ref 0.1–1.0)
Monocytes Relative: 0 %
Neutro Abs: 3.1 10*3/uL (ref 1.7–7.7)
Neutrophils Relative %: 93 %
Platelet Count: 94 10*3/uL — ABNORMAL LOW (ref 150–400)
RBC: 3.27 MIL/uL — ABNORMAL LOW (ref 3.87–5.11)
RDW: 18.6 % — ABNORMAL HIGH (ref 11.5–15.5)
WBC Count: 3.3 10*3/uL — ABNORMAL LOW (ref 4.0–10.5)
nRBC: 0 % (ref 0.0–0.2)

## 2019-08-02 LAB — CMP (CANCER CENTER ONLY)
ALT: 13 U/L (ref 10–47)
AST: 14 U/L (ref 11–38)
Albumin: 4.1 g/dL (ref 3.5–5.0)
Alkaline Phosphatase: 87 U/L (ref 38–126)
Anion gap: 16 — ABNORMAL HIGH (ref 5–15)
BUN: 13 mg/dL (ref 6–20)
CO2: 18 mmol/L — ABNORMAL LOW (ref 22–32)
Calcium: 9.5 mg/dL (ref 8.9–10.3)
Chloride: 102 mmol/L (ref 98–111)
Creatinine: 1.02 mg/dL (ref 0.60–1.20)
GFR, Est AFR Am: >60 mL/min (ref >60)
GFR, Estimated: >60 mL/min (ref >60)
Glucose, Bld: 261 mg/dL — ABNORMAL HIGH (ref 70–99)
Potassium: 3.9 mmol/L (ref 3.5–5.1)
Sodium: 136 mmol/L (ref 135–145)
Total Bilirubin: 0.2 mg/dL (ref 0.2–1.6)
Total Protein: 7.5 g/dL (ref 6.5–8.1)

## 2019-08-02 LAB — TOTAL PROTEIN, URINE DIPSTICK

## 2019-08-02 MED ORDER — SODIUM CHLORIDE 0.9 % IV SOLN
Freq: Once | INTRAVENOUS | Status: AC
  Filled 2019-08-02: qty 250

## 2019-08-02 MED ORDER — FAMOTIDINE IN NACL 20-0.9 MG/50ML-% IV SOLN
20.0000 mg | Freq: Once | INTRAVENOUS | Status: AC
  Administered 2019-08-02: 20 mg via INTRAVENOUS

## 2019-08-02 MED ORDER — HEPARIN SOD (PORK) LOCK FLUSH 100 UNIT/ML IV SOLN
500.0000 [IU] | Freq: Once | INTRAVENOUS | Status: AC | PRN
  Administered 2019-08-02: 500 [IU]
  Filled 2019-08-02: qty 5

## 2019-08-02 MED ORDER — SODIUM CHLORIDE 0.9 % IV SOLN
15.0000 mg/kg | Freq: Once | INTRAVENOUS | Status: AC
  Administered 2019-08-02: 800 mg via INTRAVENOUS
  Filled 2019-08-02: qty 32

## 2019-08-02 MED ORDER — SODIUM CHLORIDE 0.9 % IV SOLN
Freq: Once | INTRAVENOUS | Status: AC
  Filled 2019-08-02: qty 5

## 2019-08-02 MED ORDER — SODIUM CHLORIDE 0.9% FLUSH
10.0000 mL | INTRAVENOUS | Status: DC | PRN
  Administered 2019-08-02: 10 mL
  Filled 2019-08-02: qty 10

## 2019-08-02 MED ORDER — SODIUM CHLORIDE 0.9 % IV SOLN
331.0000 mg | Freq: Once | INTRAVENOUS | Status: AC
  Administered 2019-08-02: 330 mg via INTRAVENOUS
  Filled 2019-08-02: qty 33

## 2019-08-02 MED ORDER — DIPHENHYDRAMINE HCL 50 MG/ML IJ SOLN
50.0000 mg | Freq: Once | INTRAMUSCULAR | Status: AC
  Administered 2019-08-02: 50 mg via INTRAVENOUS

## 2019-08-02 MED ORDER — PALONOSETRON HCL INJECTION 0.25 MG/5ML
INTRAVENOUS | Status: AC
  Filled 2019-08-02: qty 5

## 2019-08-02 MED ORDER — PALONOSETRON HCL INJECTION 0.25 MG/5ML
0.2500 mg | Freq: Once | INTRAVENOUS | Status: AC
  Administered 2019-08-02: 0.25 mg via INTRAVENOUS

## 2019-08-02 MED ORDER — SODIUM CHLORIDE 0.9 % IV SOLN
140.0000 mg/m2 | Freq: Once | INTRAVENOUS | Status: AC
  Administered 2019-08-02: 216 mg via INTRAVENOUS
  Filled 2019-08-02: qty 36

## 2019-08-02 MED ORDER — SODIUM CHLORIDE 0.9% FLUSH
10.0000 mL | Freq: Once | INTRAVENOUS | Status: DC
  Filled 2019-08-02: qty 10

## 2019-08-02 MED ORDER — DIPHENHYDRAMINE HCL 50 MG/ML IJ SOLN
INTRAMUSCULAR | Status: AC
  Filled 2019-08-02: qty 1

## 2019-08-02 MED ORDER — FAMOTIDINE IN NACL 20-0.9 MG/50ML-% IV SOLN
INTRAVENOUS | Status: AC
  Filled 2019-08-02: qty 50

## 2019-08-02 NOTE — Progress Notes (Signed)
Nutrition referral received from RN.  53 year old female diagnosed with cervical cancer.  She is a patient of Dr. Alvy Bimler.  Past medical history includes radiation therapy.  She is currently getting chemotherapy.  Medications include Decadron, Zofran, Compazine.  Labs were reviewed.  Height: 66.5 inches. Weight: 109.5 pounds. Usual body weight: 120 pounds in October. BMI: 17.41.  Patient reports poor appetite and decreased oral intake. She has had constipation but is working on a bowel regimen. She drinks boost plus daily. She denies other nutrition impact symptoms.  Nutrition diagnosis: Unintended weight loss related to poor appetite as evidenced by 9% weight loss over 2 months which is significant.  Intervention: Patient was educated to consume small frequent meals and snacks with adequate calories and protein. Recommended increase boost plus 3 times daily between meals.  I provided her with coupons. Brief education provided on constipation and increased fluid intake. Provided fact sheets on poor appetite and increasing calories and protein.  Provided contact information.  Monitoring, evaluation, goals: Patient will tolerate increased calories and protein to minimize further weight loss.  Next visit: Tuesday, January 19 during infusion.  **Disclaimer: This note was dictated with voice recognition software. Similar sounding words can inadvertently be transcribed and this note may contain transcription errors which may not have been corrected upon publication of note.**

## 2019-08-02 NOTE — Patient Instructions (Signed)
[place] Discharge Instructions for Patients Receiving Chemotherapy  Today you received the following chemotherapy agents:  Paclitaxol & Carboplatin & immunotherapy agent :  Bevacizumab   To help prevent nausea and vomiting after your treatment, we encourage you to take your nausea medication as needed. If you develop nausea and vomiting that is not controlled by your nausea medication, call the clinic.   BELOW ARE SYMPTOMS THAT SHOULD BE REPORTED IMMEDIATELY:  *FEVER GREATER THAN 100.5 F  *CHILLS WITH OR WITHOUT FEVER  NAUSEA AND VOMITING THAT IS NOT CONTROLLED WITH YOUR NAUSEA MEDICATION  *UNUSUAL SHORTNESS OF BREATH  *UNUSUAL BRUISING OR BLEEDING  TENDERNESS IN MOUTH AND THROAT WITH OR WITHOUT PRESENCE OF ULCERS  *URINARY PROBLEMS  *BOWEL PROBLEMS  UNUSUAL RASH Items with * indicate a potential emergency and should be followed up as soon as possible.  Feel free to call the clinic should you have any questions or concerns. The clinic phone number is [phone].  Please show the [place] at check-in to the Emergency Department and triage nurse.

## 2019-08-10 ENCOUNTER — Ambulatory Visit: Payer: BLUE CROSS/BLUE SHIELD | Admitting: Hematology and Oncology

## 2019-08-10 ENCOUNTER — Ambulatory Visit: Payer: BLUE CROSS/BLUE SHIELD

## 2019-08-10 ENCOUNTER — Other Ambulatory Visit: Payer: BLUE CROSS/BLUE SHIELD

## 2019-08-23 ENCOUNTER — Ambulatory Visit (HOSPITAL_COMMUNITY)
Admission: RE | Admit: 2019-08-23 | Discharge: 2019-08-23 | Disposition: A | Discharge status: Home / Self Care | Payer: BLUE CROSS/BLUE SHIELD | Source: Ambulatory Visit | Attending: Hematology and Oncology | Admitting: Hematology and Oncology

## 2019-08-23 ENCOUNTER — Inpatient Hospital Stay: Payer: BLUE CROSS/BLUE SHIELD

## 2019-08-23 ENCOUNTER — Ambulatory Visit: Payer: Self-pay | Admitting: Radiation Oncology

## 2019-08-23 ENCOUNTER — Other Ambulatory Visit: Payer: Self-pay

## 2019-08-23 ENCOUNTER — Inpatient Hospital Stay: Payer: BLUE CROSS/BLUE SHIELD | Attending: Hematology and Oncology

## 2019-08-23 DIAGNOSIS — C530 Malignant neoplasm of endocervix: Secondary | ICD-10-CM | POA: Diagnosis present

## 2019-08-23 DIAGNOSIS — D61818 Other pancytopenia: Secondary | ICD-10-CM | POA: Insufficient documentation

## 2019-08-23 DIAGNOSIS — Z79899 Other long term (current) drug therapy: Secondary | ICD-10-CM | POA: Diagnosis not present

## 2019-08-23 DIAGNOSIS — C531 Malignant neoplasm of exocervix: Secondary | ICD-10-CM

## 2019-08-23 DIAGNOSIS — R809 Proteinuria, unspecified: Secondary | ICD-10-CM | POA: Insufficient documentation

## 2019-08-23 DIAGNOSIS — Z7189 Other specified counseling: Secondary | ICD-10-CM

## 2019-08-23 DIAGNOSIS — C539 Malignant neoplasm of cervix uteri, unspecified: Secondary | ICD-10-CM | POA: Insufficient documentation

## 2019-08-23 LAB — CMP (CANCER CENTER ONLY)
ALT: 11 U/L (ref 0–44)
AST: 15 U/L (ref 15–41)
Albumin: 3.8 g/dL (ref 3.5–5.0)
Alkaline Phosphatase: 91 U/L (ref 38–126)
Anion gap: 9 (ref 5–15)
BUN: 18 mg/dL (ref 6–20)
CO2: 23 mmol/L (ref 22–32)
Calcium: 8.9 mg/dL (ref 8.9–10.3)
Chloride: 105 mmol/L (ref 98–111)
Creatinine: 0.79 mg/dL (ref 0.44–1.00)
GFR, Est AFR Am: >60 mL/min (ref >60)
GFR, Estimated: >60 mL/min (ref >60)
Glucose, Bld: 90 mg/dL (ref 70–99)
Potassium: 4 mmol/L (ref 3.5–5.1)
Sodium: 137 mmol/L (ref 135–145)
Total Bilirubin: 0.3 mg/dL (ref 0.3–1.2)
Total Protein: 6.8 g/dL (ref 6.5–8.1)

## 2019-08-23 LAB — CBC WITH DIFFERENTIAL (CANCER CENTER ONLY)
Abs Immature Granulocytes: 0.03 10*3/uL (ref 0.00–0.07)
Basophils Absolute: 0 10*3/uL (ref 0.0–0.1)
Basophils Relative: 0 %
Eosinophils Absolute: 0 10*3/uL (ref 0.0–0.5)
Eosinophils Relative: 0 %
HCT: 28.5 % — ABNORMAL LOW (ref 36.0–46.0)
Hemoglobin: 9.3 g/dL — ABNORMAL LOW (ref 12.0–15.0)
Immature Granulocytes: 1 %
Lymphocytes Relative: 23 %
Lymphs Abs: 0.8 10*3/uL (ref 0.7–4.0)
MCH: 31.3 pg (ref 26.0–34.0)
MCHC: 32.6 g/dL (ref 30.0–36.0)
MCV: 96 fL (ref 80.0–100.0)
Monocytes Absolute: 0.4 10*3/uL (ref 0.1–1.0)
Monocytes Relative: 12 %
Neutro Abs: 2.3 10*3/uL (ref 1.7–7.7)
Neutrophils Relative %: 64 %
Platelet Count: 46 10*3/uL — ABNORMAL LOW (ref 150–400)
RBC: 2.97 MIL/uL — ABNORMAL LOW (ref 3.87–5.11)
RDW: 17.9 % — ABNORMAL HIGH (ref 11.5–15.5)
WBC Count: 3.5 10*3/uL — ABNORMAL LOW (ref 4.0–10.5)
nRBC: 0 % (ref 0.0–0.2)

## 2019-08-23 LAB — TOTAL PROTEIN, URINE DIPSTICK: Protein, ur: 30 mg/dL — AB

## 2019-08-23 LAB — GLUCOSE, CAPILLARY: Glucose-Capillary: 82 mg/dL (ref 70–99)

## 2019-08-23 MED ORDER — SODIUM CHLORIDE 0.9% FLUSH
10.0000 mL | Freq: Once | INTRAVENOUS | Status: AC
  Administered 2019-08-23: 10 mL
  Filled 2019-08-23: qty 10

## 2019-08-23 MED ORDER — HEPARIN SOD (PORK) LOCK FLUSH 100 UNIT/ML IV SOLN
500.0000 [IU] | Freq: Once | INTRAVENOUS | Status: AC
  Administered 2019-08-23: 500 [IU]
  Filled 2019-08-23: qty 5

## 2019-08-23 MED ORDER — FLUDEOXYGLUCOSE F - 18 (FDG) INJECTION
5.2000 | Freq: Once | INTRAVENOUS | Status: AC | PRN
  Administered 2019-08-23: 5.2 via INTRAVENOUS

## 2019-08-23 NOTE — Patient Instructions (Signed)

## 2019-08-24 ENCOUNTER — Inpatient Hospital Stay: Payer: BLUE CROSS/BLUE SHIELD

## 2019-08-24 ENCOUNTER — Inpatient Hospital Stay: Payer: BLUE CROSS/BLUE SHIELD | Admitting: Hematology and Oncology

## 2019-08-24 ENCOUNTER — Other Ambulatory Visit: Payer: Self-pay | Admitting: Hematology and Oncology

## 2019-08-24 ENCOUNTER — Telehealth: Payer: Self-pay | Admitting: Hematology and Oncology

## 2019-08-24 ENCOUNTER — Inpatient Hospital Stay: Payer: BLUE CROSS/BLUE SHIELD | Admitting: Nutrition

## 2019-08-24 ENCOUNTER — Other Ambulatory Visit: Payer: Self-pay

## 2019-08-24 DIAGNOSIS — Z7189 Other specified counseling: Secondary | ICD-10-CM | POA: Diagnosis not present

## 2019-08-24 DIAGNOSIS — D61818 Other pancytopenia: Secondary | ICD-10-CM | POA: Diagnosis not present

## 2019-08-24 DIAGNOSIS — R809 Proteinuria, unspecified: Secondary | ICD-10-CM | POA: Diagnosis not present

## 2019-08-24 DIAGNOSIS — C530 Malignant neoplasm of endocervix: Secondary | ICD-10-CM | POA: Diagnosis not present

## 2019-08-24 DIAGNOSIS — C539 Malignant neoplasm of cervix uteri, unspecified: Secondary | ICD-10-CM | POA: Diagnosis not present

## 2019-08-24 NOTE — Telephone Encounter (Signed)
Scheduled appt per 1/19 sch message - pt aware of appt date and times

## 2019-08-25 ENCOUNTER — Encounter: Payer: Self-pay | Admitting: Hematology and Oncology

## 2019-08-25 DIAGNOSIS — R809 Proteinuria, unspecified: Secondary | ICD-10-CM | POA: Insufficient documentation

## 2019-08-25 NOTE — Progress Notes (Signed)
[facility] OFFICE PROGRESS NOTE  Patient Care Team: Marguerita Merles, MD as PCP - General (Family Medicine) Gae Dry, MD as Referring Physician (Obstetrics and Gynecology) Awanda Mink Craige Cotta, RN as Oncology Nurse Navigator (Oncology)  ASSESSMENT & PLAN:  Cervical ca [facility]) I have reviewed PET CT scan with the patient She has very good response to therapy but tolerated the treatment poorly due to severe, recurrent pancytopenia I recommend spacing out her treatment to every 4 weeks I plan to keep carboplatin at current dose but to reduce Taxol further I recommend 3 more cycles of treatment before repeating another imaging study  Pancytopenia, acquired [facility]) She has severe pancytopenia due to treatment She is mildly symptomatic with fatigue but otherwise denies bleeding complications or infectious complications I plan to delay treatment by 1 week and further reduce the dose of Taxol for future treatment  Proteinuria She has mild proteinuria secondary to side effects of bevacizumab Her blood pressure is mildly elevated but she is not symptomatic I recommend checking her blood pressure at home If she continues to have worsening proteinuria, I might have to start her on some medications for this  Goals of care, counseling/discussion We have discussions about goals of care in the past She have incurable disease but she have responded well to treatment The plan will be to continue on chemotherapy for now   No orders of the defined types were placed in this encounter.   All questions were answered. The patient knows to call the clinic with any problems, questions or concerns. The total time spent in the appointment was 40 minutes encounter with patients including review of chart and various tests results, discussions about plan of care and coordination of care plan   Heath Lark, MD 08/25/2019 9:09 AM  INTERVAL HISTORY: Please see below for problem oriented  charting. She returns for chemotherapy and follow-up on imaging study She feels well Denies neuropathy from prior treatment No recent infection, fever or chills The patient denies any recent signs or symptoms of bleeding such as spontaneous epistaxis, hematuria or hematochezia. She have not lost any more weight since last time I saw her  SUMMARY OF ONCOLOGIC HISTORY: Oncology History Overview Note  PD-L1 0%   Cervical ca ([place])  11/03/2018 Initial Diagnosis   She presented with postmenopausal bleeding since 2019 (at least 1 year) and weight loss. Had prior abnormal PAP smear, last done in 2010   11/04/2018 Pathology Results   Endometrium, biopsy - INVASIVE MODERATELY DIFFERENTIATED SQUAMOUS CELL CARCINOMA. SEE NOTE Diagnosis Note Squamous cell carcinoma arises in a background of high-grade dysplasia with possible glandular involvement. Tumor cells are positive for CK5/6, consistent with the above diagnosis. Immunohistochemical stain for p16 is diffusely positive in the tumor cells.   11/17/2018 Cancer Staging   Staging form: Cervix Uteri, AJCC 8th Edition - Clinical: FIGO Stage IIIB, calculated as Stage IVB (cT3b, cN0, cM1) - Signed by Heath Lark, MD on 06/01/2019   11/20/2018 PET scan   IMPRESSION: 1. Cervical lesion with direct extra cervical extension posterolaterally on the right is markedly hypermetabolic consistent with known neoplasm. 2. 7 x 11 mm posterior right pelvic irregular nodule is hypermetabolic consistent with metastatic disease. 3. Para-aortic soft tissue in the region of the bifurcation and 8 mm short axis left pelvic sidewall lymph node show marked hypermetabolism, consistent with metastatic involvement. 4. Low level FDG uptake identified in a unenlarged AP window lymph node of the mediastinum with focal FDG uptake identified in  each hilum. While no underlying hilar lymph nodes evident on this noncontrast CT exam, tiny lymph nodes were visible in the hilar regions on  diagnostic CT of 11/17/2018. While this may be reactive given the low level uptake, metastatic disease cannot be definitively excluded. 5. Focus of hypermetabolic activity in the spinal canal at the T11-12 level. No underlying mass lesion evident on noncontrast CT imaging. Thoracic spine MRI without and with contrast may be warranted to further evaluate. 6. Stable moderate right hydroureteronephrosis.   11/25/2018 Procedure   Placement of single lumen port a cath via right internal jugular vein. The catheter tip lies at the cavo-atrial junction. A power injectable port a cath was placed and is ready for immediate use.    11/26/2018 Imaging   MRI spine Negative for metastatic disease. No abnormality to correlate with potential lesion within the spinal canal at T11-12 as seen on prior CT is identified.  Remote, mild superior endplate compression fracture of T12.  Widely patent central canal and foramina throughout.  Right hydronephrosis as seen on prior studies.   11/27/2018 - 01/13/2019 Chemotherapy   The patient had weekly cisplatin with radiation. Last 3 doses were delayed and dose modified due to pancytopenia   05/24/2019 PET scan   1. Mixed appearance, with they were resolution of accentuated activity in the cervix and paracervical tissues as well as resolved pelvic adenopathy; but with at least two and possibly three new hypermetabolic lesions along the liver capsule suspicious for potential metastatic lesions. 2. New vertical activity in both sacral ala, with appearance and pattern favoring sacral insufficiency fracture. 3. Other imaging findings of potential clinical significance: Aortic Atherosclerosis (ICD10-I70.0). Left anterior descending coronary artery atherosclerotic calcification. Presacral density is likely treatment related. Stable T12 anterior wedge compression fracture.    Genetic Testing   Patient has genetic testing done for PD-L1 on accession [id number]. Results  revealed patient has the following: PD-L1 0%   06/07/2019 -  Chemotherapy   The patient had carboplatin, taxol and Avastin for chemotherapy treatment.     08/23/2019 PET scan   1. The patient had two hypermetabolic lesions along the liver capsule on the prior study. One of these has resolved. The other measures smaller today but demonstrates mildly increased hypermetabolism. A third region of questioned capsular hypermetabolism along the hepatic dome previously is not hypermetabolic today. 2. Similar to slight decrease in FDG uptake associated with the cervix. 3. No new hypermetabolic disease in the neck, chest, abdomen, or pelvis. 4.  Aortic Atherosclerois (ICD10-170.0)     REVIEW OF SYSTEMS:   Constitutional: Denies fevers, chills or abnormal weight loss Eyes: Denies blurriness of vision Ears, nose, mouth, throat, and face: Denies mucositis or sore throat Respiratory: Denies cough, dyspnea or wheezes Cardiovascular: Denies palpitation, chest discomfort or lower extremity swelling Gastrointestinal:  Denies nausea, heartburn or change in bowel habits Skin: Denies abnormal skin rashes Lymphatics: Denies new lymphadenopathy or easy bruising Neurological:Denies numbness, tingling or new weaknesses Behavioral/Psych: Mood is stable, no new changes  All other systems were reviewed with the patient and are negative.  I have reviewed the past medical history, past surgical history, social history and family history with the patient and they are unchanged from previous note.  ALLERGIES:  has No Known Allergies.  MEDICATIONS:  Current Outpatient Medications  Medication Sig Dispense Refill  . acetaminophen (TYLENOL) 500 MG tablet Take 1,000 mg by mouth every 6 (six) hours as needed (pain).    Marland Kitchen dexamethasone (DECADRON) 4 MG tablet Take  2 tabs at the night before and 2 tabs the morning of chemotherapy, every 3 weeks, by mouth. Please dispense 24 tabs for total 6 cycles 24 tablet 0  .  lidocaine-prilocaine (EMLA) cream Apply 1 application topically as needed.    . ondansetron (ZOFRAN) 8 MG tablet Take 8 mg by mouth every 8 (eight) hours as needed.    . prochlorperazine (COMPAZINE) 10 MG tablet Take 10 mg by mouth every 6 (six) hours as needed.    Marland Kitchen tiZANidine (ZANAFLEX) 2 MG tablet Take 2 mg by mouth at bedtime as needed.      No current facility-administered medications for this visit.    PHYSICAL EXAMINATION: ECOG PERFORMANCE STATUS: 1 - Symptomatic but completely ambulatory  Vitals:   08/24/19 0853  BP: (!) 143/90  Pulse: (!) 105  Resp: 18  Temp: 98 F (36.7 C)  SpO2: 100%   Filed Weights   08/24/19 0853  Weight: 111 lb (50.3 kg)    GENERAL:alert, no distress and comfortable.  She looks thin Musculoskeletal:no cyanosis of digits and no clubbing  NEURO: alert & oriented x 3 with fluent speech, no focal motor/sensory deficits  LABORATORY DATA:  I have reviewed the data as listed    Component Value Date/Time   NA 137 08/23/2019 0936   K 4.0 08/23/2019 0936   CL 105 08/23/2019 0936   CO2 23 08/23/2019 0936   GLUCOSE 90 08/23/2019 0936   BUN 18 08/23/2019 0936   CREATININE 0.79 08/23/2019 0936   CALCIUM 8.9 08/23/2019 0936   PROT 6.8 08/23/2019 0936   ALBUMIN 3.8 08/23/2019 0936   AST 15 08/23/2019 0936   ALT 11 08/23/2019 0936   ALKPHOS 91 08/23/2019 0936   BILITOT 0.3 08/23/2019 0936   GFRNONAA >60 08/23/2019 0936   GFRAA >60 08/23/2019 0936    No results found for: SPEP, UPEP  Lab Results  Component Value Date   WBC 3.5 (L) 08/23/2019   NEUTROABS 2.3 08/23/2019   HGB 9.3 (L) 08/23/2019   HCT 28.5 (L) 08/23/2019   MCV 96.0 08/23/2019   PLT 46 (L) 08/23/2019      Chemistry      Component Value Date/Time   NA 137 08/23/2019 0936   K 4.0 08/23/2019 0936   CL 105 08/23/2019 0936   CO2 23 08/23/2019 0936   BUN 18 08/23/2019 0936   CREATININE 0.79 08/23/2019 0936      Component Value Date/Time   CALCIUM 8.9 08/23/2019 0936    ALKPHOS 91 08/23/2019 0936   AST 15 08/23/2019 0936   ALT 11 08/23/2019 0936   BILITOT 0.3 08/23/2019 0936       RADIOGRAPHIC STUDIES: I have reviewed multiple imaging studies with the patient I have personally reviewed the radiological images as listed and agreed with the findings in the report. NM PET Image Restag (PS) Skull Base To Thigh  Result Date: 08/23/2019 CLINICAL DATA:  Subsequent treatment strategy for uterine cancer. EXAM: NUCLEAR MEDICINE PET SKULL BASE TO THIGH TECHNIQUE: 5.2 mCi F-18 FDG was injected intravenously. Full-ring PET imaging was performed from the skull base to thigh after the radiotracer. CT data was obtained and used for attenuation correction and anatomic localization. Fasting blood glucose: 82 mg/dl COMPARISON:  05/24/2019 FINDINGS: Mediastinal blood pool activity: SUV max 2.0 Liver activity: SUV max NA NECK: No hypermetabolic lymph nodes in the neck. Incidental CT findings: none CHEST: No hypermetabolic mediastinal or hilar nodes. No suspicious pulmonary nodules on the CT scan. Incidental CT findings: Right  Port-A-Cath tip is in the right atrium. ABDOMEN/PELVIS: The hypermetabolic lesion seen along the liver capsule described previously as anterior segment IV along the diaphragm (image 90/series 4) persists. This measures 15 mm today compared to 23 mm on the previous study. SUV max = 6.3 today compared to 4.3 previously. A second hypermetabolic lesion along the liver capsule identified on the previous study just anterior to the falciform ligament has nearly resolved in the interval, barely perceptible on image 107 of series 4 compared to 8 mm previously. There is no hypermetabolism discernible in this location on today's study. A third area of questioned hypermetabolism along the dome of the right liver on the previous study shows no hypermetabolic activity today. The low level FDG uptake measured along the right aspect of the cervix/vaginal fornix previously at SUV max =  3.4 measures 2.3 today. No hypermetabolic pelvic sidewall lymphadenopathy on today's study. Incidental CT findings: There is abdominal aortic atherosclerosis without aneurysm. Similar appearance of soft tissue stranding in the presacral tissues, presumably treatment related. SKELETON: Interval decrease in the bilateral sacral uptake identified previously, suggesting insufficiency fracture. No new hypermetabolic bone lesions. Incidental CT findings: none IMPRESSION: 1. The patient had two hypermetabolic lesions along the liver capsule on the prior study. One of these has resolved. The other measures smaller today but demonstrates mildly increased hypermetabolism. A third region of questioned capsular hypermetabolism along the hepatic dome previously is not hypermetabolic today. 2. Similar to slight decrease in FDG uptake associated with the cervix. 3. No new hypermetabolic disease in the neck, chest, abdomen, or pelvis. 4.  Aortic Atherosclerois (ICD10-170.0) Electronically Signed   By: Misty Stanley M.D.   On: 08/23/2019 08:58

## 2019-08-25 NOTE — Assessment & Plan Note (Signed)
She has severe pancytopenia due to treatment She is mildly symptomatic with fatigue but otherwise denies bleeding complications or infectious complications I plan to delay treatment by 1 week and further reduce the dose of Taxol for future treatment

## 2019-08-25 NOTE — Assessment & Plan Note (Addendum)
I have reviewed PET CT scan with the patient She has very good response to therapy but tolerated the treatment poorly due to severe, recurrent pancytopenia I recommend spacing out her treatment to every 4 weeks I plan to keep carboplatin at current dose but to reduce Taxol further I recommend 3 more cycles of treatment before repeating another imaging study

## 2019-08-25 NOTE — Assessment & Plan Note (Signed)
We have discussions about goals of care in the past She have incurable disease but she have responded well to treatment The plan will be to continue on chemotherapy for now

## 2019-08-25 NOTE — Assessment & Plan Note (Signed)
She has mild proteinuria secondary to side effects of bevacizumab Her blood pressure is mildly elevated but she is not symptomatic I recommend checking her blood pressure at home If she continues to have worsening proteinuria, I might have to start her on some medications for this

## 2019-08-27 ENCOUNTER — Encounter: Payer: Self-pay | Admitting: Hematology and Oncology

## 2019-08-27 NOTE — Progress Notes (Signed)
Patient's plan is non-participating with [facility]. I submitted a request form on 08/26/2019 for Continuity of Care.  Called patient several times to explain and advise. No answer. Left message to call me back. I also emailed Shannon Nifong on 08/19/2019 to see if someone on her team could contact patient to discuss billing options.

## 2019-09-01 ENCOUNTER — Inpatient Hospital Stay: Payer: BLUE CROSS/BLUE SHIELD

## 2019-09-01 ENCOUNTER — Other Ambulatory Visit: Payer: Self-pay

## 2019-09-01 ENCOUNTER — Other Ambulatory Visit: Payer: Self-pay | Admitting: *Deleted

## 2019-09-01 ENCOUNTER — Other Ambulatory Visit: Payer: Self-pay | Admitting: Hematology and Oncology

## 2019-09-01 ENCOUNTER — Telehealth: Payer: Self-pay

## 2019-09-01 DIAGNOSIS — C531 Malignant neoplasm of exocervix: Secondary | ICD-10-CM

## 2019-09-01 DIAGNOSIS — D61818 Other pancytopenia: Secondary | ICD-10-CM

## 2019-09-01 DIAGNOSIS — C539 Malignant neoplasm of cervix uteri, unspecified: Secondary | ICD-10-CM | POA: Diagnosis not present

## 2019-09-01 DIAGNOSIS — Z7189 Other specified counseling: Secondary | ICD-10-CM

## 2019-09-01 LAB — CBC WITH DIFFERENTIAL (CANCER CENTER ONLY)
Abs Immature Granulocytes: 0.02 10*3/uL (ref 0.00–0.07)
Basophils Absolute: 0 10*3/uL (ref 0.0–0.1)
Basophils Relative: 0 %
Eosinophils Absolute: 0 10*3/uL (ref 0.0–0.5)
Eosinophils Relative: 0 %
HCT: 28.9 % — ABNORMAL LOW (ref 36.0–46.0)
Hemoglobin: 9.7 g/dL — ABNORMAL LOW (ref 12.0–15.0)
Immature Granulocytes: 1 %
Lymphocytes Relative: 9 %
Lymphs Abs: 0.3 10*3/uL — ABNORMAL LOW (ref 0.7–4.0)
MCH: 32.7 pg (ref 26.0–34.0)
MCHC: 33.6 g/dL (ref 30.0–36.0)
MCV: 97.3 fL (ref 80.0–100.0)
Monocytes Absolute: 0.1 10*3/uL (ref 0.1–1.0)
Monocytes Relative: 3 %
Neutro Abs: 2.5 10*3/uL (ref 1.7–7.7)
Neutrophils Relative %: 87 %
Platelet Count: 30 10*3/uL — ABNORMAL LOW (ref 150–400)
RBC: 2.97 MIL/uL — ABNORMAL LOW (ref 3.87–5.11)
RDW: 17.1 % — ABNORMAL HIGH (ref 11.5–15.5)
WBC Count: 2.9 10*3/uL — ABNORMAL LOW (ref 4.0–10.5)
nRBC: 0 % (ref 0.0–0.2)

## 2019-09-01 LAB — CMP (CANCER CENTER ONLY)
ALT: 9 U/L (ref 0–44)
AST: 11 U/L — ABNORMAL LOW (ref 15–41)
Albumin: 3.8 g/dL (ref 3.5–5.0)
Alkaline Phosphatase: 76 U/L (ref 38–126)
Anion gap: 13 (ref 5–15)
BUN: 18 mg/dL (ref 6–20)
CO2: 20 mmol/L — ABNORMAL LOW (ref 22–32)
Calcium: 9.2 mg/dL (ref 8.9–10.3)
Chloride: 103 mmol/L (ref 98–111)
Creatinine: 0.84 mg/dL (ref 0.44–1.00)
GFR, Est AFR Am: >60 mL/min (ref >60)
GFR, Estimated: >60 mL/min (ref >60)
Glucose, Bld: 191 mg/dL — ABNORMAL HIGH (ref 70–99)
Potassium: 3.9 mmol/L (ref 3.5–5.1)
Sodium: 136 mmol/L (ref 135–145)
Total Bilirubin: 0.3 mg/dL (ref 0.3–1.2)
Total Protein: 6.7 g/dL (ref 6.5–8.1)

## 2019-09-01 MED ORDER — DIPHENHYDRAMINE HCL 50 MG/ML IJ SOLN
INTRAMUSCULAR | Status: AC
  Filled 2019-09-01: qty 1

## 2019-09-01 MED ORDER — FAMOTIDINE IN NACL 20-0.9 MG/50ML-% IV SOLN
INTRAVENOUS | Status: AC
  Filled 2019-09-01: qty 50

## 2019-09-01 MED ORDER — SODIUM CHLORIDE 0.9% FLUSH
10.0000 mL | Freq: Once | INTRAVENOUS | Status: AC
  Administered 2019-09-01: 10 mL
  Filled 2019-09-01: qty 10

## 2019-09-01 MED ORDER — DEXAMETHASONE 4 MG PO TABS: ORAL_TABLET | ORAL | 0 refills | Status: DC

## 2019-09-01 MED ORDER — PALONOSETRON HCL INJECTION 0.25 MG/5ML
INTRAVENOUS | Status: AC
  Filled 2019-09-01: qty 5

## 2019-09-01 NOTE — Telephone Encounter (Signed)
Called and given below message again. She verbalized understanding. She will call back with bp readings when she gets home.

## 2019-09-01 NOTE — Patient Instructions (Signed)

## 2019-09-01 NOTE — Telephone Encounter (Signed)
-----   Message from Heath Lark, MD sent at 09/01/2019  7:34 AM EST ----- Regarding: can you call and ask how is her BP doing?

## 2019-09-02 NOTE — Telephone Encounter (Signed)
She called back with blood pressure readings. She lost the paper with blood pressure readings.  Blood pressure last pm after exercise 144/102 Today 152/83 and pulse 68. She will continue to check blood pressure am/ pm from now on. She was told yesterday that her chemo would be rescheduled to tomorrow Friday. She is not sure if that is correct?

## 2019-09-02 NOTE — Telephone Encounter (Signed)
I sent scheduling msg to reschedule to next Friday, not worked on yet

## 2019-09-02 NOTE — Telephone Encounter (Signed)
Called and given below message. She verbalized understanding. 

## 2019-09-13 ENCOUNTER — Encounter: Payer: Self-pay | Admitting: Hematology and Oncology

## 2019-09-13 ENCOUNTER — Inpatient Hospital Stay: Payer: BLUE CROSS/BLUE SHIELD

## 2019-09-13 ENCOUNTER — Other Ambulatory Visit: Payer: Self-pay

## 2019-09-13 ENCOUNTER — Other Ambulatory Visit: Payer: BLUE CROSS/BLUE SHIELD

## 2019-09-13 ENCOUNTER — Inpatient Hospital Stay: Payer: BLUE CROSS/BLUE SHIELD | Attending: Gynecologic Oncology | Admitting: Hematology and Oncology

## 2019-09-13 VITALS — BP 146/99 | HR 111 | Temp 97.8°F | Resp 18 | Ht 66.5 in | Wt 112.4 lb

## 2019-09-13 DIAGNOSIS — Z5111 Encounter for antineoplastic chemotherapy: Secondary | ICD-10-CM | POA: Diagnosis present

## 2019-09-13 DIAGNOSIS — E538 Deficiency of other specified B group vitamins: Secondary | ICD-10-CM | POA: Diagnosis not present

## 2019-09-13 DIAGNOSIS — C539 Malignant neoplasm of cervix uteri, unspecified: Secondary | ICD-10-CM | POA: Insufficient documentation

## 2019-09-13 DIAGNOSIS — K5909 Other constipation: Secondary | ICD-10-CM | POA: Diagnosis not present

## 2019-09-13 DIAGNOSIS — C531 Malignant neoplasm of exocervix: Secondary | ICD-10-CM

## 2019-09-13 DIAGNOSIS — D61818 Other pancytopenia: Secondary | ICD-10-CM | POA: Diagnosis not present

## 2019-09-13 DIAGNOSIS — Z7189 Other specified counseling: Secondary | ICD-10-CM

## 2019-09-13 LAB — CBC WITH DIFFERENTIAL (CANCER CENTER ONLY)
Abs Immature Granulocytes: 0.01 10*3/uL (ref 0.00–0.07)
Basophils Absolute: 0 10*3/uL (ref 0.0–0.1)
Basophils Relative: 0 %
Eosinophils Absolute: 0 10*3/uL (ref 0.0–0.5)
Eosinophils Relative: 0 %
HCT: 29.3 % — ABNORMAL LOW (ref 36.0–46.0)
Hemoglobin: 9.5 g/dL — ABNORMAL LOW (ref 12.0–15.0)
Immature Granulocytes: 1 %
Lymphocytes Relative: 14 %
Lymphs Abs: 0.3 10*3/uL — ABNORMAL LOW (ref 0.7–4.0)
MCH: 32.2 pg (ref 26.0–34.0)
MCHC: 32.4 g/dL (ref 30.0–36.0)
MCV: 99.3 fL (ref 80.0–100.0)
Monocytes Absolute: 0.1 10*3/uL (ref 0.1–1.0)
Monocytes Relative: 4 %
Neutro Abs: 1.6 10*3/uL — ABNORMAL LOW (ref 1.7–7.7)
Neutrophils Relative %: 81 %
Platelet Count: 50 10*3/uL — ABNORMAL LOW (ref 150–400)
RBC: 2.95 MIL/uL — ABNORMAL LOW (ref 3.87–5.11)
RDW: 16.4 % — ABNORMAL HIGH (ref 11.5–15.5)
WBC Count: 2 10*3/uL — ABNORMAL LOW (ref 4.0–10.5)
nRBC: 0 % (ref 0.0–0.2)

## 2019-09-13 LAB — CMP (CANCER CENTER ONLY)
ALT: 15 U/L (ref 0–44)
AST: 16 U/L (ref 15–41)
Albumin: 3.8 g/dL (ref 3.5–5.0)
Alkaline Phosphatase: 82 U/L (ref 38–126)
Anion gap: 13 (ref 5–15)
BUN: 14 mg/dL (ref 6–20)
CO2: 23 mmol/L (ref 22–32)
Calcium: 9.8 mg/dL (ref 8.9–10.3)
Chloride: 100 mmol/L (ref 98–111)
Creatinine: 0.83 mg/dL (ref 0.44–1.00)
GFR, Est AFR Am: >60 mL/min (ref >60)
GFR, Estimated: >60 mL/min (ref >60)
Glucose, Bld: 182 mg/dL — ABNORMAL HIGH (ref 70–99)
Potassium: 3.6 mmol/L (ref 3.5–5.1)
Sodium: 136 mmol/L (ref 135–145)
Total Bilirubin: 0.2 mg/dL — ABNORMAL LOW (ref 0.3–1.2)
Total Protein: 6.9 g/dL (ref 6.5–8.1)

## 2019-09-13 LAB — TOTAL PROTEIN, URINE DIPSTICK: Protein, ur: NEGATIVE mg/dL

## 2019-09-13 MED ORDER — CYANOCOBALAMIN 1000 MCG/ML IJ SOLN
INTRAMUSCULAR | Status: AC
  Filled 2019-09-13: qty 1

## 2019-09-13 MED ORDER — CYANOCOBALAMIN 1000 MCG/ML IJ SOLN
1000.0000 ug | Freq: Once | INTRAMUSCULAR | Status: AC
  Administered 2019-09-13: 09:00:00 1000 ug via INTRAMUSCULAR

## 2019-09-13 NOTE — Progress Notes (Signed)
Received a call from [place] with [employer] [phone] stated patient does quality for continuity of care. She suggested to contact [place] [facility] plan to submit a request for an in-network exception. I contacted [place] spoke with Marchelle L. Per [place] case pending review. Faxed clinicals to [phone], case # [id number].

## 2019-09-13 NOTE — Assessment & Plan Note (Signed)
She is symptomatic with bruises but no frank bleeding I suspect a component of her pancytopenia is due to borderline B12 deficiency I have drawn copper level We will give her a B12 injection today I encouraged her to also increase oral intake as tolerated to help with bone marrow recovery She is taking multivitamin supplement

## 2019-09-13 NOTE — Progress Notes (Signed)
[facility] OFF[facility] PROGRESS NOTE  Patient Care Team: Marguerita Merles, MD as PCP - General (Family Medicine) Gae Dry, MD as Referring Physician (Obstetrics and Gynecology) Awanda Mink Craige Cotta, RN as Oncology Nurse Navigator (Oncology)  ASSESSMENT & PLAN:  Cervical ca [facility]) Even though PET CT scan show positive response to treatment, overall, she tolerated treatment very poorly due to very slow bone marrow recovery from each cycle of treatment We will continue to delay her treatment until at the end of the month Continue supportive care for now  Pancytopenia, acquired [facility]) She is symptomatic with bruises but no frank bleeding I suspect a component of her pancytopenia is due to borderline B12 deficiency I have drawn copper level We will give her a B12 injection today I encouraged her to also increase oral intake as tolerated to help with bone marrow recovery She is taking multivitamin supplement  Other constipation She has chronic constipation which I suspect it could have contributed to her poor appetite We discussed the importance of laxative therapy   Orders Placed This Encounter  Procedures  . SCHEDULING COMMUNICATION INJECTION    Schedule 1 hour injection appointment    All questions were answered. The patient knows to call the clinic with any problems, questions or concerns. The total time spent in the appointment was 20 minutes encounter with patients including review of chart and various tests results, discussions about plan of care and coordination of care plan   Heath Lark, MD 09/13/2019 9:57 AM  INTERVAL HISTORY: Please see below for problem oriented charting. She returns for further follow-up She noticed some easy bruises No recent bleeding No recent infection, fever or chills She has some chronic constipation, only going to the bathroom every 2 to 3 days She is suspect her poor appetite could have contributed to her constipation She is not using  laxative on a regular basis No recent nausea  SUMMARY OF ONCOLOGIC HISTORY: Oncology History Overview Note  PD-L1 0%   Cervical ca ([place])  11/03/2018 Initial Diagnosis   She presented with postmenopausal bleeding since 2019 (at least 1 year) and weight loss. Had prior abnormal PAP smear, last done in 2010   11/04/2018 Pathology Results   Endometrium, biopsy - INVASIVE MODERATELY DIFFERENTIATED SQUAMOUS CELL CARCINOMA. SEE NOTE Diagnosis Note Squamous cell carcinoma arises in a background of high-grade dysplasia with possible glandular involvement. Tumor cells are positive for CK5/6, consistent with the above diagnosis. Immunohistochemical stain for p16 is diffusely positive in the tumor cells.   11/17/2018 Cancer Staging   Staging form: Cervix Uteri, AJCC 8th Edition - Clinical: FIGO Stage IIIB, calculated as Stage IVB (cT3b, cN0, cM1) - Signed by Heath Lark, MD on 06/01/2019   11/20/2018 PET scan   IMPRESSION: 1. Cervical lesion with direct extra cervical extension posterolaterally on the right is markedly hypermetabolic consistent with known neoplasm. 2. 7 x 11 mm posterior right pelvic irregular nodule is hypermetabolic consistent with metastatic disease. 3. Para-aortic soft tissue in the region of the bifurcation and 8 mm short axis left pelvic sidewall lymph node show marked hypermetabolism, consistent with metastatic involvement. 4. Low level FDG uptake identified in a unenlarged AP window lymph node of the mediastinum with focal FDG uptake identified in each hilum. While no underlying hilar lymph nodes evident on this noncontrast CT exam, tiny lymph nodes were visible in the hilar regions on diagnostic CT of 11/17/2018. While this may be reactive given the low level uptake, metastatic disease cannot be definitively  excluded. 5. Focus of hypermetabolic activity in the spinal canal at the T11-12 level. No underlying mass lesion evident on noncontrast CT imaging. Thoracic spine MRI without  and with contrast may be warranted to further evaluate. 6. Stable moderate right hydroureteronephrosis.   11/25/2018 Procedure   Placement of single lumen port a cath via right internal jugular vein. The catheter tip lies at the cavo-atrial junction. A power injectable port a cath was placed and is ready for immediate use.    11/26/2018 Imaging   MRI spine Negative for metastatic disease. No abnormality to correlate with potential lesion within the spinal canal at T11-12 as seen on prior CT is identified.  Remote, mild superior endplate compression fracture of T12.  Widely patent central canal and foramina throughout.  Right hydronephrosis as seen on prior studies.   11/27/2018 - 01/13/2019 Chemotherapy   The patient had weekly cisplatin with radiation. Last 3 doses were delayed and dose modified due to pancytopenia   05/24/2019 PET scan   1. Mixed appearance, with they were resolution of accentuated activity in the cervix and paracervical tissues as well as resolved pelvic adenopathy; but with at least two and possibly three new hypermetabolic lesions along the liver capsule suspicious for potential metastatic lesions. 2. New vertical activity in both sacral ala, with appearance and pattern favoring sacral insufficiency fracture. 3. Other imaging findings of potential clinical significance: Aortic Atherosclerosis (ICD10-I70.0). Left anterior descending coronary artery atherosclerotic calcification. Presacral density is likely treatment related. Stable T12 anterior wedge compression fracture.    Genetic Testing   Patient has genetic testing done for PD-L1 on accession [id number]. Results revealed patient has the following: PD-L1 0%   06/07/2019 -  Chemotherapy   The patient had carboplatin, taxol and Avastin for chemotherapy treatment.     08/23/2019 PET scan   1. The patient had two hypermetabolic lesions along the liver capsule on the prior study. One of these has resolved. The  other measures smaller today but demonstrates mildly increased hypermetabolism. A third region of questioned capsular hypermetabolism along the hepatic dome previously is not hypermetabolic today. 2. Similar to slight decrease in FDG uptake associated with the cervix. 3. No new hypermetabolic disease in the neck, chest, abdomen, or pelvis. 4.  Aortic Atherosclerois (ICD10-170.0)     REVIEW OF SYSTEMS:   Constitutional: Denies fevers, chills or abnormal weight loss Eyes: Denies blurriness of vision Ears, nose, mouth, throat, and face: Denies mucositis or sore throat Respiratory: Denies cough, dyspnea or wheezes Cardiovascular: Denies palpitation, chest discomfort or lower extremity swelling Skin: Denies abnormal skin rashes Lymphatics: Denies new lymphadenopathy  Neurological:Denies numbness, tingling or new weaknesses Behavioral/Psych: Mood is stable, no new changes  All other systems were reviewed with the patient and are negative.  I have reviewed the past medical history, past surgical history, social history and family history with the patient and they are unchanged from previous note.  ALLERGIES:  has No Known Allergies.  MEDICATIONS:  Current Outpatient Medications  Medication Sig Dispense Refill  . acetaminophen (TYLENOL) 500 MG tablet Take 1,000 mg by mouth every 6 (six) hours as needed (pain).    Marland Kitchen dexamethasone (DECADRON) 4 MG tablet Take 2 tabs at the night before and 2 tabs the morning of chemotherapy, every 3 weeks, by mouth. Please dispense 24 tabs for total 6 cycles 24 tablet 0  . lidocaine-prilocaine (EMLA) cream Apply 1 application topically as needed.    . ondansetron (ZOFRAN) 8 MG tablet Take 8 mg by mouth every  8 (eight) hours as needed.    . prochlorperazine (COMPAZINE) 10 MG tablet Take 10 mg by mouth every 6 (six) hours as needed.    Marland Kitchen tiZANidine (ZANAFLEX) 2 MG tablet Take 2 mg by mouth at bedtime as needed.      No current facility-administered medications  for this visit.    PHYSICAL EXAMINATION: ECOG PERFORMANCE STATUS: 1 - Symptomatic but completely ambulatory  Vitals:   09/13/19 0858  BP: (!) 146/99  Pulse: (!) 111  Resp: 18  Temp: 97.8 F (36.6 C)  SpO2: 100%   Filed Weights   09/13/19 0858  Weight: 112 lb 6.4 oz (51 kg)    GENERAL:alert, no distress and comfortable.  She looks thin NEURO: alert & oriented x 3 with fluent speech, no focal motor/sensory deficits  LABORATORY DATA:  I have reviewed the data as listed    Component Value Date/Time   NA 136 09/13/2019 0835   K 3.6 09/13/2019 0835   CL 100 09/13/2019 0835   CO2 23 09/13/2019 0835   GLUCOSE 182 (H) 09/13/2019 0835   BUN 14 09/13/2019 0835   CREATININE 0.83 09/13/2019 0835   CALCIUM 9.8 09/13/2019 0835   PROT 6.9 09/13/2019 0835   ALBUMIN 3.8 09/13/2019 0835   AST 16 09/13/2019 0835   ALT 15 09/13/2019 0835   ALKPHOS 82 09/13/2019 0835   BILITOT 0.2 (L) 09/13/2019 0835   GFRNONAA >60 09/13/2019 0835   GFRAA >60 09/13/2019 0835    No results found for: SPEP, UPEP  Lab Results  Component Value Date   WBC 2.0 (L) 09/13/2019   NEUTROABS PENDING 09/13/2019   HGB 9.5 (L) 09/13/2019   HCT 29.3 (L) 09/13/2019   MCV 99.3 09/13/2019   PLT 50 (L) 09/13/2019      Chemistry      Component Value Date/Time   NA 136 09/13/2019 0835   K 3.6 09/13/2019 0835   CL 100 09/13/2019 0835   CO2 23 09/13/2019 0835   BUN 14 09/13/2019 0835   CREATININE 0.83 09/13/2019 0835      Component Value Date/Time   CALCIUM 9.8 09/13/2019 0835   ALKPHOS 82 09/13/2019 0835   AST 16 09/13/2019 0835   ALT 15 09/13/2019 0835   BILITOT 0.2 (L) 09/13/2019 0835       RADIOGRAPHIC STUDIES: I have personally reviewed the radiological images as listed and agreed with the findings in the report. NM PET Image Restag (PS) Skull Base To Thigh  Result Date: 08/23/2019 CLINICAL DATA:  Subsequent treatment strategy for uterine cancer. EXAM: NUCLEAR MEDICINE PET SKULL BASE TO THIGH  TECHNIQUE: 5.2 mCi F-18 FDG was injected intravenously. Full-ring PET imaging was performed from the skull base to thigh after the radiotracer. CT data was obtained and used for attenuation correction and anatomic localization. Fasting blood glucose: 82 mg/dl COMPARISON:  05/24/2019 FINDINGS: Mediastinal blood pool activity: SUV max 2.0 Liver activity: SUV max NA NECK: No hypermetabolic lymph nodes in the neck. Incidental CT findings: none CHEST: No hypermetabolic mediastinal or hilar nodes. No suspicious pulmonary nodules on the CT scan. Incidental CT findings: Right Port-A-Cath tip is in the right atrium. ABDOMEN/PELVIS: The hypermetabolic lesion seen along the liver capsule described previously as anterior segment IV along the diaphragm (image 90/series 4) persists. This measures 15 mm today compared to 23 mm on the previous study. SUV max = 6.3 today compared to 4.3 previously. A second hypermetabolic lesion along the liver capsule identified on the previous study just anterior to  the falciform ligament has nearly resolved in the interval, barely perceptible on image 107 of series 4 compared to 8 mm previously. There is no hypermetabolism discernible in this location on today's study. A third area of questioned hypermetabolism along the dome of the right liver on the previous study shows no hypermetabolic activity today. The low level FDG uptake measured along the right aspect of the cervix/vaginal fornix previously at SUV max = 3.4 measures 2.3 today. No hypermetabolic pelvic sidewall lymphadenopathy on today's study. Incidental CT findings: There is abdominal aortic atherosclerosis without aneurysm. Similar appearance of soft tissue stranding in the presacral tissues, presumably treatment related. SKELETON: Interval decrease in the bilateral sacral uptake identified previously, suggesting insufficiency fracture. No new hypermetabolic bone lesions. Incidental CT findings: none IMPRESSION: 1. The patient had two  hypermetabolic lesions along the liver capsule on the prior study. One of these has resolved. The other measures smaller today but demonstrates mildly increased hypermetabolism. A third region of questioned capsular hypermetabolism along the hepatic dome previously is not hypermetabolic today. 2. Similar to slight decrease in FDG uptake associated with the cervix. 3. No new hypermetabolic disease in the neck, chest, abdomen, or pelvis. 4.  Aortic Atherosclerois (ICD10-170.0) Electronically Signed   By: Misty Stanley M.D.   On: 08/23/2019 08:58

## 2019-09-13 NOTE — Assessment & Plan Note (Signed)
She has chronic constipation which I suspect it could have contributed to her poor appetite We discussed the importance of laxative therapy

## 2019-09-13 NOTE — Assessment & Plan Note (Signed)
Even though PET CT scan show positive response to treatment, overall, she tolerated treatment very poorly due to very slow bone marrow recovery from each cycle of treatment We will continue to delay her treatment until at the end of the month Continue supportive care for now

## 2019-09-16 LAB — COPPER, SERUM: Copper: 170 ug/dL — ABNORMAL HIGH (ref 80–158)

## 2019-09-30 ENCOUNTER — Inpatient Hospital Stay: Payer: BLUE CROSS/BLUE SHIELD | Admitting: Nutrition

## 2019-09-30 ENCOUNTER — Other Ambulatory Visit: Payer: Self-pay

## 2019-09-30 ENCOUNTER — Inpatient Hospital Stay: Payer: BLUE CROSS/BLUE SHIELD

## 2019-09-30 ENCOUNTER — Inpatient Hospital Stay (HOSPITAL_BASED_OUTPATIENT_CLINIC_OR_DEPARTMENT_OTHER): Payer: BLUE CROSS/BLUE SHIELD | Admitting: Hematology and Oncology

## 2019-09-30 VITALS — BP 153/90 | HR 97 | Temp 98.3°F | Resp 17 | Ht 66.5 in | Wt 113.8 lb

## 2019-09-30 DIAGNOSIS — D61818 Other pancytopenia: Secondary | ICD-10-CM

## 2019-09-30 DIAGNOSIS — C530 Malignant neoplasm of endocervix: Secondary | ICD-10-CM | POA: Diagnosis not present

## 2019-09-30 DIAGNOSIS — Z7189 Other specified counseling: Secondary | ICD-10-CM

## 2019-09-30 DIAGNOSIS — G8929 Other chronic pain: Secondary | ICD-10-CM

## 2019-09-30 DIAGNOSIS — C531 Malignant neoplasm of exocervix: Secondary | ICD-10-CM

## 2019-09-30 DIAGNOSIS — Z5111 Encounter for antineoplastic chemotherapy: Secondary | ICD-10-CM | POA: Diagnosis not present

## 2019-09-30 DIAGNOSIS — I10 Essential (primary) hypertension: Secondary | ICD-10-CM | POA: Diagnosis not present

## 2019-09-30 DIAGNOSIS — M549 Dorsalgia, unspecified: Secondary | ICD-10-CM | POA: Diagnosis not present

## 2019-09-30 DIAGNOSIS — R809 Proteinuria, unspecified: Secondary | ICD-10-CM | POA: Diagnosis not present

## 2019-09-30 LAB — CBC WITH DIFFERENTIAL (CANCER CENTER ONLY)
Abs Immature Granulocytes: 0.02 10*3/uL (ref 0.00–0.07)
Basophils Absolute: 0 10*3/uL (ref 0.0–0.1)
Basophils Relative: 0 %
Eosinophils Absolute: 0 10*3/uL (ref 0.0–0.5)
Eosinophils Relative: 0 %
HCT: 30.8 % — ABNORMAL LOW (ref 36.0–46.0)
Hemoglobin: 10 g/dL — ABNORMAL LOW (ref 12.0–15.0)
Immature Granulocytes: 1 %
Lymphocytes Relative: 15 %
Lymphs Abs: 0.3 10*3/uL — ABNORMAL LOW (ref 0.7–4.0)
MCH: 32.7 pg (ref 26.0–34.0)
MCHC: 32.5 g/dL (ref 30.0–36.0)
MCV: 100.7 fL — ABNORMAL HIGH (ref 80.0–100.0)
Monocytes Absolute: 0.1 10*3/uL (ref 0.1–1.0)
Monocytes Relative: 5 %
Neutro Abs: 1.7 10*3/uL (ref 1.7–7.7)
Neutrophils Relative %: 79 %
Platelet Count: 79 10*3/uL — ABNORMAL LOW (ref 150–400)
RBC: 3.06 MIL/uL — ABNORMAL LOW (ref 3.87–5.11)
RDW: 15 % (ref 11.5–15.5)
WBC Count: 2.1 10*3/uL — ABNORMAL LOW (ref 4.0–10.5)
nRBC: 0 % (ref 0.0–0.2)

## 2019-09-30 LAB — CMP (CANCER CENTER ONLY)
ALT: 11 U/L (ref 0–44)
AST: 15 U/L (ref 15–41)
Albumin: 3.8 g/dL (ref 3.5–5.0)
Alkaline Phosphatase: 95 U/L (ref 38–126)
Anion gap: 12 (ref 5–15)
BUN: 17 mg/dL (ref 6–20)
CO2: 22 mmol/L (ref 22–32)
Calcium: 9.8 mg/dL (ref 8.9–10.3)
Chloride: 104 mmol/L (ref 98–111)
Creatinine: 0.81 mg/dL (ref 0.44–1.00)
GFR, Est AFR Am: >60 mL/min (ref >60)
GFR, Estimated: >60 mL/min (ref >60)
Glucose, Bld: 185 mg/dL — ABNORMAL HIGH (ref 70–99)
Potassium: 3.9 mmol/L (ref 3.5–5.1)
Sodium: 138 mmol/L (ref 135–145)
Total Bilirubin: 0.2 mg/dL — ABNORMAL LOW (ref 0.3–1.2)
Total Protein: 7.3 g/dL (ref 6.5–8.1)

## 2019-09-30 LAB — TOTAL PROTEIN, URINE DIPSTICK: Protein, ur: 30 mg/dL — AB

## 2019-09-30 MED ORDER — MORPHINE SULFATE 15 MG PO TABS: 15.0000 mg | ORAL_TABLET | ORAL | 0 refills | Status: DC | PRN

## 2019-09-30 MED ORDER — SODIUM CHLORIDE 0.9 % IV SOLN
331.0000 mg | Freq: Once | INTRAVENOUS | Status: AC
  Administered 2019-09-30: 330 mg via INTRAVENOUS
  Filled 2019-09-30: qty 33

## 2019-09-30 MED ORDER — AMLODIPINE BESYLATE 10 MG PO TABS: 10.0000 mg | ORAL_TABLET | Freq: Every day | ORAL | 11 refills | Status: AC

## 2019-09-30 MED ORDER — PROCHLORPERAZINE MALEATE 10 MG PO TABS: 10.0000 mg | ORAL_TABLET | Freq: Four times a day (QID) | ORAL | 11 refills | Status: AC | PRN

## 2019-09-30 MED ORDER — SODIUM CHLORIDE 0.9% FLUSH
10.0000 mL | Freq: Once | INTRAVENOUS | Status: AC
  Administered 2019-09-30: 10 mL
  Filled 2019-09-30: qty 10

## 2019-09-30 MED ORDER — PALONOSETRON HCL INJECTION 0.25 MG/5ML
INTRAVENOUS | Status: AC
  Filled 2019-09-30: qty 5

## 2019-09-30 MED ORDER — PALONOSETRON HCL INJECTION 0.25 MG/5ML
0.2500 mg | Freq: Once | INTRAVENOUS | Status: AC
  Administered 2019-09-30: 0.25 mg via INTRAVENOUS

## 2019-09-30 MED ORDER — HEPARIN SOD (PORK) LOCK FLUSH 100 UNIT/ML IV SOLN
500.0000 [IU] | Freq: Once | INTRAVENOUS | Status: AC | PRN
  Administered 2019-09-30: 500 [IU]
  Filled 2019-09-30: qty 5

## 2019-09-30 MED ORDER — SODIUM CHLORIDE 0.9 % IV SOLN
15.0000 mg/kg | Freq: Once | INTRAVENOUS | Status: AC
  Administered 2019-09-30: 800 mg via INTRAVENOUS
  Filled 2019-09-30: qty 32

## 2019-09-30 MED ORDER — SODIUM CHLORIDE 0.9 % IV SOLN
Freq: Once | INTRAVENOUS | Status: AC
  Filled 2019-09-30: qty 250

## 2019-09-30 MED ORDER — SODIUM CHLORIDE 0.9% FLUSH
10.0000 mL | INTRAVENOUS | Status: DC | PRN
  Administered 2019-09-30: 10 mL
  Filled 2019-09-30: qty 10

## 2019-09-30 MED ORDER — FAMOTIDINE IN NACL 20-0.9 MG/50ML-% IV SOLN
INTRAVENOUS | Status: AC
  Filled 2019-09-30: qty 50

## 2019-09-30 MED ORDER — FAMOTIDINE IN NACL 20-0.9 MG/50ML-% IV SOLN
20.0000 mg | Freq: Once | INTRAVENOUS | Status: AC
  Administered 2019-09-30: 20 mg via INTRAVENOUS

## 2019-09-30 MED ORDER — SODIUM CHLORIDE 0.9 % IV SOLN
Freq: Once | INTRAVENOUS | Status: AC
  Filled 2019-09-30: qty 5

## 2019-09-30 NOTE — Patient Instructions (Signed)

## 2019-09-30 NOTE — Progress Notes (Signed)
Nutrition follow-up completed with patient during chemotherapy for cervical cancer. Weight improved slightly and documented as 113.8 pounds February 25 increased from 111 pounds January 19. Patient reports she still struggles with constipation.  She continues to work on a bowel regimen. Reports she is drinking plenty of fluids. Denies other nutrition impact symptoms. Declines oral nutrition supplement coupons.  Nutrition diagnosis: Unintended weight loss improved.  Intervention: Patient was educated to continue small frequent meals and snacks with high-calorie, high-protein foods. Recommended increased fluids. Encouraged continued focus on bowel regimen.  Monitoring, evaluation, goals: Patient will continue to work to increase calories and protein to minimize weight loss.  Next visit: To be scheduled as needed.  Patient has my contact information.  **Disclaimer: This note was dictated with voice recognition software. Similar sounding words can inadvertently be transcribed and this note may contain transcription errors which may not have been corrected upon publication of note.**

## 2019-09-30 NOTE — Patient Instructions (Signed)
[facility] Discharge Instructions for Patients Receiving Chemotherapy  Today you received the following chemotherapy agents:   Carboplatin & immunotherapy agent :  Bevacizumab   To help prevent nausea and vomiting after your treatment, we encourage you to take your nausea medication as needed. If you develop nausea and vomiting that is not controlled by your nausea medication, call the clinic.   BELOW ARE SYMPTOMS THAT SHOULD BE REPORTED IMMEDIATELY:  *FEVER GREATER THAN 100.5 F  *CHILLS WITH OR WITHOUT FEVER  NAUSEA AND VOMITING THAT IS NOT CONTROLLED WITH YOUR NAUSEA MEDICATION  *UNUSUAL SHORTNESS OF BREATH  *UNUSUAL BRUISING OR BLEEDING  TENDERNESS IN MOUTH AND THROAT WITH OR WITHOUT PRESENCE OF ULCERS  *URINARY PROBLEMS  *BOWEL PROBLEMS  UNUSUAL RASH Items with * indicate a potential emergency and should be followed up as soon as possible.  Feel free to call the clinic should you have any questions or concerns. The clinic phone number is [phone].  Please show the [place] at check-in to the Emergency Department and triage nurse.

## 2019-09-30 NOTE — Progress Notes (Signed)
Per Dr. Alvy Bimler, patient is okay to receive treatment today with platelets 79. Patient will receive avastin and carboplatin only.

## 2019-10-01 ENCOUNTER — Encounter: Payer: Self-pay | Admitting: Hematology and Oncology

## 2019-10-01 ENCOUNTER — Telehealth: Payer: Self-pay | Admitting: Hematology and Oncology

## 2019-10-01 NOTE — Assessment & Plan Note (Signed)
Even though PET CT scan show positive response to treatment, overall, she tolerated treatment very poorly due to very slow bone marrow recovery from each cycle of treatment Due to numerous side effects of treatment and significant delay of treatment, I recommend discontinuation of paclitaxel This is a calculated plan as I believe her disease would likely respond better to carboplatin She has minimum side effects with bevacizumab except for mild hypertension I recommend we discontinue Taxol altogether and proceed with dose adjusted carboplatin and bevacizumab I do not plan to repeat another imaging study and until at least April The patient is instructed to check her blood pressure on a regular basis and we will call her next week to see how her blood pressure is doing, along with pain management Continue supportive care for now

## 2019-10-01 NOTE — Assessment & Plan Note (Signed)
Her pancytopenia has improved with delay of initiation of chemotherapy but I am concerned about her long-term treatment efficacy with continuous delay I plan to reinitiate single agent carboplatin along with bevacizumab without Taxol We will continue to monitor her blood counts carefully

## 2019-10-01 NOTE — Assessment & Plan Note (Signed)
She continues to have poorly controlled pain, likely exacerbated by side effects of therapy Tramadol was not helpful We discussed the risk and benefits of narcotic prescription for pain management I warned her about risk of sedation, constipation and nausea We discussed narcotic refill policy and she is in agreement to proceed with morphine sulfate prescription

## 2019-10-01 NOTE — Progress Notes (Signed)
[facility] OFF[facility] PROGRESS NOTE  Patient Care Team: Marguerita Merles, MD as PCP - General (Family Medicine) Gae Dry, MD as Referring Physician (Obstetrics and Gynecology) Awanda Mink Craige Cotta, RN as Oncology Nurse Navigator (Oncology)  ASSESSMENT & PLAN:  Cervical ca [facility]) Even though PET CT scan show positive response to treatment, overall, she tolerated treatment very poorly due to very slow bone marrow recovery from each cycle of treatment Due to numerous side effects of treatment and significant delay of treatment, I recommend discontinuation of paclitaxel This is a calculated plan as I believe her disease would likely respond better to carboplatin She has minimum side effects with bevacizumab except for mild hypertension I recommend we discontinue Taxol altogether and proceed with dose adjusted carboplatin and bevacizumab I do not plan to repeat another imaging study and until at least April The patient is instructed to check her blood pressure on a regular basis and we will call her next week to see how her blood pressure is doing, along with pain management Continue supportive care for now  Chronic back pain greater than 3 months duration She continues to have poorly controlled pain, likely exacerbated by side effects of therapy Tramadol was not helpful We discussed the risk and benefits of narcotic prescription for pain management I warned her about risk of sedation, constipation and nausea We discussed narcotic refill policy and she is in agreement to proceed with morphine sulfate prescription  Pancytopenia, acquired ([place]) Her pancytopenia has improved with delay of initiation of chemotherapy but I am concerned about her long-term treatment efficacy with continuous delay I plan to reinitiate single agent carboplatin along with bevacizumab without Taxol We will continue to monitor her blood counts carefully  Essential hypertension Her blood pressure is grossly  elevated likely is due to side effects of bevacizumab I recommend starting her on blood pressure medication The patient is instructed to monitor her blood pressure regularly twice a day I will call her next week to see how her blood pressure is doing with antihypertensives   No orders of the defined types were placed in this encounter.   All questions were answered. The patient knows to call the clinic with any problems, questions or concerns. The total time spent in the appointment was 40 minutes encounter with patients including review of chart and various tests results, discussions about plan of care and coordination of care plan   Heath Lark, MD 10/01/2019 10:24 AM  INTERVAL HISTORY: Please see below for problem oriented charting. She returns for chemotherapy and follow-up Since last time I saw her, she has noticed that her blood pressure at home has been running somewhat high, with systolic blood pressure usually in the 150s She denies worsening neuropathy but does complain that worsening back pain She had history of fracture of her back and it seems to be exacerbated due to recent chemotherapy She denies recent fever or chills Denies abnormal vaginal discharge or bleeding  SUMMARY OF ONCOLOGIC HISTORY: Oncology History Overview Note  PD-L1 0%   Cervical ca ([place])  11/03/2018 Initial Diagnosis   She presented with postmenopausal bleeding since 2019 (at least 1 year) and weight loss. Had prior abnormal PAP smear, last done in 2010   11/04/2018 Pathology Results   Endometrium, biopsy - INVASIVE MODERATELY DIFFERENTIATED SQUAMOUS CELL CARCINOMA. SEE NOTE Diagnosis Note Squamous cell carcinoma arises in a background of high-grade dysplasia with possible glandular involvement. Tumor cells are positive for CK5/6, consistent with the above diagnosis. Immunohistochemical  stain for p16 is diffusely positive in the tumor cells.   11/17/2018 Cancer Staging   Staging form: Cervix Uteri,  AJCC 8th Edition - Clinical: FIGO Stage IIIB, calculated as Stage IVB (cT3b, cN0, cM1) - Signed by Heath Lark, MD on 06/01/2019   11/20/2018 PET scan   IMPRESSION: 1. Cervical lesion with direct extra cervical extension posterolaterally on the right is markedly hypermetabolic consistent with known neoplasm. 2. 7 x 11 mm posterior right pelvic irregular nodule is hypermetabolic consistent with metastatic disease. 3. Para-aortic soft tissue in the region of the bifurcation and 8 mm short axis left pelvic sidewall lymph node show marked hypermetabolism, consistent with metastatic involvement. 4. Low level FDG uptake identified in a unenlarged AP window lymph node of the mediastinum with focal FDG uptake identified in each hilum. While no underlying hilar lymph nodes evident on this noncontrast CT exam, tiny lymph nodes were visible in the hilar regions on diagnostic CT of 11/17/2018. While this may be reactive given the low level uptake, metastatic disease cannot be definitively excluded. 5. Focus of hypermetabolic activity in the spinal canal at the T11-12 level. No underlying mass lesion evident on noncontrast CT imaging. Thoracic spine MRI without and with contrast may be warranted to further evaluate. 6. Stable moderate right hydroureteronephrosis.   11/25/2018 Procedure   Placement of single lumen port a cath via right internal jugular vein. The catheter tip lies at the cavo-atrial junction. A power injectable port a cath was placed and is ready for immediate use.    11/26/2018 Imaging   MRI spine Negative for metastatic disease. No abnormality to correlate with potential lesion within the spinal canal at T11-12 as seen on prior CT is identified.  Remote, mild superior endplate compression fracture of T12.  Widely patent central canal and foramina throughout.  Right hydronephrosis as seen on prior studies.   11/27/2018 - 01/13/2019 Chemotherapy   The patient had weekly cisplatin with  radiation. Last 3 doses were delayed and dose modified due to pancytopenia   05/24/2019 PET scan   1. Mixed appearance, with they were resolution of accentuated activity in the cervix and paracervical tissues as well as resolved pelvic adenopathy; but with at least two and possibly three new hypermetabolic lesions along the liver capsule suspicious for potential metastatic lesions. 2. New vertical activity in both sacral ala, with appearance and pattern favoring sacral insufficiency fracture. 3. Other imaging findings of potential clinical significance: Aortic Atherosclerosis (ICD10-I70.0). Left anterior descending coronary artery atherosclerotic calcification. Presacral density is likely treatment related. Stable T12 anterior wedge compression fracture.    Genetic Testing   Patient has genetic testing done for PD-L1 on accession [id number]. Results revealed patient has the following: PD-L1 0%   06/07/2019 -  Chemotherapy   The patient had carboplatin, taxol and Avastin for chemotherapy treatment.     08/23/2019 PET scan   1. The patient had two hypermetabolic lesions along the liver capsule on the prior study. One of these has resolved. The other measures smaller today but demonstrates mildly increased hypermetabolism. A third region of questioned capsular hypermetabolism along the hepatic dome previously is not hypermetabolic today. 2. Similar to slight decrease in FDG uptake associated with the cervix. 3. No new hypermetabolic disease in the neck, chest, abdomen, or pelvis. 4.  Aortic Atherosclerois (ICD10-170.0)     REVIEW OF SYSTEMS:   Constitutional: Denies fevers, chills or abnormal weight loss Eyes: Denies blurriness of vision Ears, nose, mouth, throat, and face: Denies mucositis or sore  throat Respiratory: Denies cough, dyspnea or wheezes Cardiovascular: Denies palpitation, chest discomfort or lower extremity swelling Gastrointestinal:  Denies nausea, heartburn or change in bowel  habits Skin: Denies abnormal skin rashes Lymphatics: Denies new lymphadenopathy or easy bruising Neurological:Denies numbness, tingling or new weaknesses Behavioral/Psych: Mood is stable, no new changes  All other systems were reviewed with the patient and are negative.  I have reviewed the past medical history, past surgical history, social history and family history with the patient and they are unchanged from previous note.  ALLERGIES:  has No Known Allergies.  MEDICATIONS:  Current Outpatient Medications  Medication Sig Dispense Refill  . acetaminophen (TYLENOL) 500 MG tablet Take 1,000 mg by mouth every 6 (six) hours as needed (pain).    Marland Kitchen amLODipine (NORVASC) 10 MG tablet Take 1 tablet (10 mg total) by mouth daily. 30 tablet 11  . lidocaine-prilocaine (EMLA) cream Apply 1 application topically as needed.    Marland Kitchen morphine (MSIR) 15 MG tablet Take 1 tablet (15 mg total) by mouth every 4 (four) hours as needed for severe pain. 30 tablet 0  . ondansetron (ZOFRAN) 8 MG tablet Take 8 mg by mouth every 8 (eight) hours as needed.    . prochlorperazine (COMPAZINE) 10 MG tablet Take 1 tablet (10 mg total) by mouth every 6 (six) hours as needed. 30 tablet 11  . tiZANidine (ZANAFLEX) 2 MG tablet Take 2 mg by mouth at bedtime as needed.      No current facility-administered medications for this visit.    PHYSICAL EXAMINATION: ECOG PERFORMANCE STATUS: 1 - Symptomatic but completely ambulatory  Vitals:   09/30/19 1025  BP: (!) 153/90  Pulse: 97  Resp: 17  Temp: 98.3 F (36.8 C)  SpO2: 100%   Filed Weights   09/30/19 1025  Weight: 113 lb 12.8 oz (51.6 kg)    GENERAL:alert, no distress and comfortable.  She looks thin and cachectic SKIN: skin color, texture, turgor are normal, no rashes or significant lesions EYES: normal, Conjunctiva are pink and non-injected, sclera clear OROPHARYNX:no exudate, no erythema and lips, buccal mucosa, and tongue normal  NECK: supple, thyroid normal  size, non-tender, without nodularity LYMPH:  no palpable lymphadenopathy in the cervical, axillary or inguinal LUNGS: clear to auscultation and percussion with normal breathing effort HEART: regular rate & rhythm and no murmurs and no lower extremity edema ABDOMEN:abdomen soft, non-tender and normal bowel sounds Musculoskeletal:no cyanosis of digits and no clubbing  NEURO: alert & oriented x 3 with fluent speech, no focal motor/sensory deficits  LABORATORY DATA:  I have reviewed the data as listed    Component Value Date/Time   NA 138 09/30/2019 0942   K 3.9 09/30/2019 0942   CL 104 09/30/2019 0942   CO2 22 09/30/2019 0942   GLUCOSE 185 (H) 09/30/2019 0942   BUN 17 09/30/2019 0942   CREATININE 0.81 09/30/2019 0942   CALCIUM 9.8 09/30/2019 0942   PROT 7.3 09/30/2019 0942   ALBUMIN 3.8 09/30/2019 0942   AST 15 09/30/2019 0942   ALT 11 09/30/2019 0942   ALKPHOS 95 09/30/2019 0942   BILITOT 0.2 (L) 09/30/2019 0942   GFRNONAA >60 09/30/2019 0942   GFRAA >60 09/30/2019 0942    No results found for: SPEP, UPEP  Lab Results  Component Value Date   WBC 2.1 (L) 09/30/2019   NEUTROABS 1.7 09/30/2019   HGB 10.0 (L) 09/30/2019   HCT 30.8 (L) 09/30/2019   MCV 100.7 (H) 09/30/2019   PLT 79 (L) 09/30/2019  Chemistry      Component Value Date/Time   NA 138 09/30/2019 0942   K 3.9 09/30/2019 0942   CL 104 09/30/2019 0942   CO2 22 09/30/2019 0942   BUN 17 09/30/2019 0942   CREATININE 0.81 09/30/2019 0942      Component Value Date/Time   CALCIUM 9.8 09/30/2019 0942   ALKPHOS 95 09/30/2019 0942   AST 15 09/30/2019 0942   ALT 11 09/30/2019 0942   BILITOT 0.2 (L) 09/30/2019 2122

## 2019-10-01 NOTE — Telephone Encounter (Signed)
Scheduled appt per 2/26 sch message - pt is aware of appt date and time   

## 2019-10-01 NOTE — Assessment & Plan Note (Signed)
Her blood pressure is grossly elevated likely is due to side effects of bevacizumab I recommend starting her on blood pressure medication The patient is instructed to monitor her blood pressure regularly twice a day I will call her next week to see how her blood pressure is doing with antihypertensives

## 2019-10-06 ENCOUNTER — Telehealth: Payer: Self-pay

## 2019-10-06 NOTE — Telephone Encounter (Signed)
Per Dr Alvy Bimler pt should take zofran prior to taking norvasc to see if that helps. Pt agreable and will call back tomorrow to let us know how she feels.

## 2019-10-06 NOTE — Telephone Encounter (Signed)
LVM instructing pt to return call regarding her BP.

## 2019-10-06 NOTE — Telephone Encounter (Signed)
Called pt again and lvm for pt to return call regarding BP and new medication.

## 2019-10-06 NOTE — Telephone Encounter (Signed)
-----   Message from Heath Lark, MD sent at 10/06/2019  7:26 AM EST ----- Regarding: can you call and ask how is her BP doing?

## 2019-10-06 NOTE — Telephone Encounter (Signed)
Pt called and states having nausea and "feeling bad" since she started taking the amolodipine.  Pt also reports getting hot and having to put cold wash cloth on her face.  BP readings are  174/116  AM (pt not sure what day) 138/84 Sunday AM 142/93   This AM   157/99 PM 159/92 PM

## 2019-10-07 NOTE — Telephone Encounter (Signed)
Hi Brenda,   Please call her later and see how she is doing with her nausea and BP

## 2019-10-07 NOTE — Telephone Encounter (Signed)
Called and given below message. She verbalized understanding. BP today is 145/81. Nausea went away last night. She is taking Compazine. She is not sure if it was really nausea. She thinks that it may be just abdominal pain. She is not really sure. She is going to take tylenol if needed. Instructed to call the office if needed.

## 2019-10-11 ENCOUNTER — Telehealth: Payer: Self-pay

## 2019-10-11 NOTE — Telephone Encounter (Signed)
Called and left below message. Ask her to call the office back. 

## 2019-10-11 NOTE — Telephone Encounter (Signed)
-----   Message from Heath Lark, MD sent at 10/11/2019  9:47 AM EST ----- Regarding: can you call and ask how is her BP and nausea?

## 2019-10-12 NOTE — Telephone Encounter (Signed)
She called back and given below message again. Denies nausea. She takes the compazine prior to blood pressure medication. Blood pressure this am 127/78 BP yesterday am 137/87 and pm 147/91. This is the only readings she had when called back.

## 2019-10-12 NOTE — Telephone Encounter (Signed)
Called and left below message. Ask her to call the office back. 

## 2019-10-18 ENCOUNTER — Telehealth: Payer: Self-pay

## 2019-10-18 NOTE — Telephone Encounter (Signed)
She called and left a message to call her.  Called back. She is complaining of no appetite. She is unable to eat because nothing tastes good. She trying to force herself to eat. She is able to drink fluids. She has not eaten much all weekend. She does not think that she is having nausea. Denies vomiting. She did take Zofran to see if it would help.  She is requesting something for her appetite.

## 2019-10-18 NOTE — Telephone Encounter (Signed)
She smoked yesterday x 1 to help with appetite. But not cigarettes, she stopped smoking cigarettes. She will take whatever you want to prescribe.

## 2019-10-18 NOTE — Telephone Encounter (Signed)
Did she smoker recently? If smoking, I would prescribe remeron If not smoking, we can try megace. Megace is more effective but I would be worried about risks of blood clots if she smokes

## 2019-10-19 ENCOUNTER — Other Ambulatory Visit: Payer: Self-pay | Admitting: Hematology and Oncology

## 2019-10-19 DIAGNOSIS — C530 Malignant neoplasm of endocervix: Secondary | ICD-10-CM

## 2019-10-19 DIAGNOSIS — R64 Cachexia: Secondary | ICD-10-CM | POA: Insufficient documentation

## 2019-10-19 MED ORDER — MIRTAZAPINE 15 MG PO TABS: 15.0000 mg | ORAL_TABLET | Freq: Every day | ORAL | 1 refills | Status: DC

## 2019-10-19 NOTE — Telephone Encounter (Signed)
Called and left below message. Ask her to call the office for questions. ?

## 2019-10-19 NOTE — Telephone Encounter (Signed)
I sent remeron to her pharmacy, take at night I will assess when I see her

## 2019-10-22 NOTE — Progress Notes (Signed)
Pharmacist Chemotherapy Monitoring - Follow Up Assessment    I verify that I have reviewed each item in the below checklist:  . Regimen for the patient is scheduled for the appropriate day and plan matches scheduled date. Marland Kitchen Appropriate non-routine labs are ordered dependent on drug ordered. . If applicable, additional medications reviewed and ordered per protocol based on lifetime cumulative doses and/or treatment regimen.   Plan for follow-up and/or issues identified: No . I-vent associated with next due treatment: No . MD and/or nursing notified: No   Kennith Center, Pharm.D., CPP 10/22/2019@3 :36 PM

## 2019-10-28 ENCOUNTER — Inpatient Hospital Stay: Payer: BLUE CROSS/BLUE SHIELD | Admitting: Nutrition

## 2019-10-28 ENCOUNTER — Inpatient Hospital Stay: Payer: BLUE CROSS/BLUE SHIELD

## 2019-10-28 ENCOUNTER — Inpatient Hospital Stay: Payer: BLUE CROSS/BLUE SHIELD | Admitting: Hematology and Oncology

## 2019-10-29 ENCOUNTER — Telehealth: Payer: Self-pay | Admitting: Hematology and Oncology

## 2019-10-29 NOTE — Telephone Encounter (Signed)
Cancelled appts on 3/25 per sch msg at patients request to rescheduled. Called and left msg for patient to call back to schedule the missed appts. Left number on voicemail

## 2019-11-01 NOTE — Progress Notes (Signed)
Pharmacist Chemotherapy Monitoring - Follow Up Assessment    I verify that I have reviewed each item in the below checklist:  . Regimen for the patient is scheduled for the appropriate day and plan matches scheduled date. Marland Kitchen Appropriate non-routine labs are ordered dependent on drug ordered. . If applicable, additional medications reviewed and ordered per protocol based on lifetime cumulative doses and/or treatment regimen.   Plan for follow-up and/or issues identified: Yes . I-vent associated with next due treatment: Yes . MD and/or nursing notified: No   Kennith Center, Pharm.D., CPP 11/01/2019@12 :29 PM

## 2019-11-05 ENCOUNTER — Other Ambulatory Visit: Payer: Self-pay

## 2019-11-05 ENCOUNTER — Encounter: Payer: Self-pay | Admitting: Hematology and Oncology

## 2019-11-05 ENCOUNTER — Inpatient Hospital Stay: Payer: BLUE CROSS/BLUE SHIELD | Attending: Gynecologic Oncology

## 2019-11-05 ENCOUNTER — Inpatient Hospital Stay (HOSPITAL_BASED_OUTPATIENT_CLINIC_OR_DEPARTMENT_OTHER): Payer: BLUE CROSS/BLUE SHIELD | Admitting: Hematology and Oncology

## 2019-11-05 ENCOUNTER — Telehealth: Payer: Self-pay | Admitting: Hematology and Oncology

## 2019-11-05 ENCOUNTER — Inpatient Hospital Stay: Payer: BLUE CROSS/BLUE SHIELD

## 2019-11-05 ENCOUNTER — Other Ambulatory Visit: Payer: Self-pay | Admitting: Hematology and Oncology

## 2019-11-05 VITALS — BP 132/99 | HR 78 | Temp 97.8°F | Resp 18

## 2019-11-05 VITALS — BP 121/76 | HR 114 | Temp 98.1°F | Ht 66.5 in | Wt 106.8 lb

## 2019-11-05 DIAGNOSIS — M549 Dorsalgia, unspecified: Secondary | ICD-10-CM

## 2019-11-05 DIAGNOSIS — C787 Secondary malignant neoplasm of liver and intrahepatic bile duct: Secondary | ICD-10-CM | POA: Diagnosis not present

## 2019-11-05 DIAGNOSIS — C531 Malignant neoplasm of exocervix: Secondary | ICD-10-CM

## 2019-11-05 DIAGNOSIS — E538 Deficiency of other specified B group vitamins: Secondary | ICD-10-CM | POA: Diagnosis not present

## 2019-11-05 DIAGNOSIS — D61818 Other pancytopenia: Secondary | ICD-10-CM

## 2019-11-05 DIAGNOSIS — C530 Malignant neoplasm of endocervix: Secondary | ICD-10-CM

## 2019-11-05 DIAGNOSIS — R112 Nausea with vomiting, unspecified: Secondary | ICD-10-CM | POA: Insufficient documentation

## 2019-11-05 DIAGNOSIS — R634 Abnormal weight loss: Secondary | ICD-10-CM | POA: Diagnosis not present

## 2019-11-05 DIAGNOSIS — G8929 Other chronic pain: Secondary | ICD-10-CM

## 2019-11-05 DIAGNOSIS — Z7189 Other specified counseling: Secondary | ICD-10-CM

## 2019-11-05 DIAGNOSIS — G893 Neoplasm related pain (acute) (chronic): Secondary | ICD-10-CM | POA: Insufficient documentation

## 2019-11-05 DIAGNOSIS — R195 Other fecal abnormalities: Secondary | ICD-10-CM | POA: Diagnosis not present

## 2019-11-05 DIAGNOSIS — C539 Malignant neoplasm of cervix uteri, unspecified: Secondary | ICD-10-CM | POA: Insufficient documentation

## 2019-11-05 LAB — CMP (CANCER CENTER ONLY)
ALT: 7 U/L (ref 0–44)
AST: 13 U/L — ABNORMAL LOW (ref 15–41)
Albumin: 3.7 g/dL (ref 3.5–5.0)
Alkaline Phosphatase: 74 U/L (ref 38–126)
Anion gap: 15 (ref 5–15)
BUN: 19 mg/dL (ref 6–20)
CO2: 19 mmol/L — ABNORMAL LOW (ref 22–32)
Calcium: 9.1 mg/dL (ref 8.9–10.3)
Chloride: 107 mmol/L (ref 98–111)
Creatinine: 0.83 mg/dL (ref 0.44–1.00)
GFR, Est AFR Am: >60 mL/min (ref >60)
GFR, Estimated: >60 mL/min (ref >60)
Glucose, Bld: 125 mg/dL — ABNORMAL HIGH (ref 70–99)
Potassium: 3.5 mmol/L (ref 3.5–5.1)
Sodium: 141 mmol/L (ref 135–145)
Total Bilirubin: 0.3 mg/dL (ref 0.3–1.2)
Total Protein: 6.8 g/dL (ref 6.5–8.1)

## 2019-11-05 LAB — CBC WITH DIFFERENTIAL (CANCER CENTER ONLY)
Abs Immature Granulocytes: 0.01 10*3/uL (ref 0.00–0.07)
Basophils Absolute: 0 10*3/uL (ref 0.0–0.1)
Basophils Relative: 0 %
Eosinophils Absolute: 0 10*3/uL (ref 0.0–0.5)
Eosinophils Relative: 0 %
HCT: 18.6 % — ABNORMAL LOW (ref 36.0–46.0)
Hemoglobin: 5.6 g/dL — CL (ref 12.0–15.0)
Immature Granulocytes: 0 %
Lymphocytes Relative: 10 %
Lymphs Abs: 0.4 10*3/uL — ABNORMAL LOW (ref 0.7–4.0)
MCH: 33.5 pg (ref 26.0–34.0)
MCHC: 30.1 g/dL (ref 30.0–36.0)
MCV: 111.4 fL — ABNORMAL HIGH (ref 80.0–100.0)
Monocytes Absolute: 0.4 10*3/uL (ref 0.1–1.0)
Monocytes Relative: 10 %
Neutro Abs: 2.8 10*3/uL (ref 1.7–7.7)
Neutrophils Relative %: 80 %
Platelet Count: 47 10*3/uL — ABNORMAL LOW (ref 150–400)
RBC: 1.67 MIL/uL — ABNORMAL LOW (ref 3.87–5.11)
RDW: 19.6 % — ABNORMAL HIGH (ref 11.5–15.5)
WBC Count: 3.6 10*3/uL — ABNORMAL LOW (ref 4.0–10.5)
nRBC: 0 % (ref 0.0–0.2)

## 2019-11-05 LAB — TOTAL PROTEIN, URINE DIPSTICK: Protein, ur: 100 mg/dL — AB

## 2019-11-05 LAB — ABO/RH: ABO/RH(D): O NEG

## 2019-11-05 LAB — PREPARE RBC (CROSSMATCH)

## 2019-11-05 MED ORDER — CYANOCOBALAMIN 1000 MCG/ML IJ SOLN
1000.0000 ug | Freq: Once | INTRAMUSCULAR | Status: AC
  Administered 2019-11-05: 1000 ug via INTRAMUSCULAR

## 2019-11-05 MED ORDER — SODIUM CHLORIDE 0.9 % IV SOLN
INTRAVENOUS | Status: DC
  Filled 2019-11-05: qty 250

## 2019-11-05 MED ORDER — ACETAMINOPHEN 325 MG PO TABS
ORAL_TABLET | ORAL | Status: AC
  Filled 2019-11-05: qty 2

## 2019-11-05 MED ORDER — MORPHINE SULFATE (PF) 4 MG/ML IV SOLN
INTRAVENOUS | Status: AC
  Filled 2019-11-05: qty 1

## 2019-11-05 MED ORDER — MORPHINE SULFATE 4 MG/ML IJ SOLN
4.0000 mg | Freq: Once | INTRAMUSCULAR | Status: AC
  Administered 2019-11-05: 4 mg via INTRAVENOUS
  Filled 2019-11-05: qty 1

## 2019-11-05 MED ORDER — HEPARIN SOD (PORK) LOCK FLUSH 100 UNIT/ML IV SOLN
250.0000 [IU] | INTRAVENOUS | Status: DC | PRN
  Filled 2019-11-05: qty 5

## 2019-11-05 MED ORDER — DIPHENHYDRAMINE HCL 25 MG PO CAPS
25.0000 mg | ORAL_CAPSULE | Freq: Once | ORAL | Status: AC
  Administered 2019-11-05: 13:00:00 25 mg via ORAL

## 2019-11-05 MED ORDER — ACETAMINOPHEN 325 MG PO TABS
650.0000 mg | ORAL_TABLET | Freq: Once | ORAL | Status: AC
  Administered 2019-11-05: 650 mg via ORAL

## 2019-11-05 MED ORDER — CYANOCOBALAMIN 1000 MCG/ML IJ SOLN
INTRAMUSCULAR | Status: AC
  Filled 2019-11-05: qty 1

## 2019-11-05 MED ORDER — DIPHENHYDRAMINE HCL 25 MG PO CAPS
ORAL_CAPSULE | ORAL | Status: AC
  Filled 2019-11-05: qty 1

## 2019-11-05 MED ORDER — SODIUM CHLORIDE 0.9 % IV SOLN
Freq: Once | INTRAVENOUS | Status: AC
  Filled 2019-11-05: qty 250

## 2019-11-05 MED ORDER — MORPHINE SULFATE 15 MG PO TABS: 15.0000 mg | ORAL_TABLET | ORAL | 0 refills | Status: DC | PRN

## 2019-11-05 MED ORDER — HEPARIN SOD (PORK) LOCK FLUSH 100 UNIT/ML IV SOLN
500.0000 [IU] | Freq: Once | INTRAVENOUS | Status: AC
  Administered 2019-11-05: 500 [IU]
  Filled 2019-11-05: qty 5

## 2019-11-05 MED ORDER — SODIUM CHLORIDE 0.9% FLUSH
10.0000 mL | INTRAVENOUS | Status: AC | PRN
  Administered 2019-11-05: 10 mL
  Filled 2019-11-05: qty 10

## 2019-11-05 MED ORDER — SODIUM CHLORIDE 0.9% IV SOLUTION
250.0000 mL | Freq: Once | INTRAVENOUS | Status: AC
  Administered 2019-11-05: 250 mL via INTRAVENOUS
  Filled 2019-11-05: qty 250

## 2019-11-05 MED ORDER — SODIUM CHLORIDE 0.9% FLUSH
10.0000 mL | Freq: Once | INTRAVENOUS | Status: AC
  Administered 2019-11-05: 10 mL
  Filled 2019-11-05: qty 10

## 2019-11-05 MED ORDER — SODIUM CHLORIDE 0.9 % IV SOLN
Freq: Once | INTRAVENOUS | Status: AC
  Filled 2019-11-05: qty 4

## 2019-11-05 MED FILL — MORPHINE SULFATE IR 15 MG T: 15 | 10 days supply | Qty: 60 | Fill #0

## 2019-11-05 NOTE — Patient Instructions (Signed)

## 2019-11-05 NOTE — Assessment & Plan Note (Signed)
She continues to have frequent diarrhea The cause is unknown I will order imaging study as above We will continue supportive care Clinically, she appears somewhat dehydrated I recommend IV fluid support and she agreed

## 2019-11-05 NOTE — Assessment & Plan Note (Signed)
She continues to have severe back pain I refill her prescription morphine sulfate today We will reassess for possible bone metastasis with PET CT scan next week

## 2019-11-05 NOTE — Assessment & Plan Note (Signed)
Unfortunately, she continues to have general deterioration with uncontrolled nausea, vomiting, progressive weight loss and severe pancytopenia I suspect she have disease progression I will continue supportive care only today and plan to order imaging study next week for further follow-up

## 2019-11-05 NOTE — Progress Notes (Signed)
[place] OFFICE PROGRESS NOTE  Patient Care Team: Marguerita Merles, MD as PCP - General (Family Medicine) Gae Dry, MD as Referring Physician (Obstetrics and Gynecology) Awanda Mink Craige Cotta, RN as Oncology Nurse Navigator (Oncology)  ASSESSMENT & PLAN:  Cervical ca [facility]) Unfortunately, Jennifer Santos continues to have general deterioration with uncontrolled nausea, vomiting, progressive weight loss and severe pancytopenia I suspect Jennifer Santos have disease progression I will continue supportive care only today and plan to order imaging study next week for further follow-up  Pancytopenia, acquired [facility]) Jennifer Santos has severe pancytopenia and Jennifer Santos is symptomatic In addition to vitamin B12 deficiency, I suspect Jennifer Santos might have bony metastasis causing severe pancytopenia We will hold treatment today I will give Jennifer Santos B12 injection today  We discussed some of the risks, benefits, and alternatives of blood transfusions. The patient is symptomatic from anemia and the hemoglobin level is critically low.  Some of the side-effects to be expected including risks of transfusion reactions, chills, infection, syndrome of volume overload and risk of hospitalization from various reasons and the patient is willing to proceed and went ahead to sign consent today. Jennifer Santos will receive 2 units of blood today Jennifer Santos does not need platelet transfusion support I plan to recheck Jennifer Santos blood again when I see Jennifer Santos next time   Chronic back pain greater than 3 months duration Jennifer Santos continues to have severe back pain I refill Jennifer Santos prescription morphine sulfate today We will reassess for possible bone metastasis with PET CT scan next week  Loose bowel movement Jennifer Santos continues to have frequent diarrhea The cause is unknown I will order imaging study as above We will continue supportive care Clinically, Jennifer Santos appears somewhat dehydrated I recommend IV fluid support and Jennifer Santos agreed   Orders Placed This Encounter  Procedures  . NM PET  Image Restag (PS) Skull Base To Thigh    Standing Status:   Future    Standing Expiration Date:   11/04/2020    Order Specific Question:   If indicated for the ordered procedure, I authorize the administration of a radiopharmaceutical per Radiology protocol    Answer:   Yes    Order Specific Question:   Preferred imaging location?    Answer:   [facility]    Order Specific Question:   Radiology Contrast Protocol - do NOT remove file path    Answer:   [email or web address]    Order Specific Question:   Is the patient pregnant?    Answer:   No  . Informed Consent Details: Physician/Practitioner Attestation; Transcribe to consent form and obtain patient signature    Standing Status:   Future    Standing Expiration Date:   11/04/2020    Order Specific Question:   Physician/Practitioner attestation of informed consent for blood and or blood product transfusion    Answer:   I, the physician/practitioner, attest that I have discussed with the patient the benefits, risks, side effects, alternatives, likelihood of achieving goals and potential problems during recovery for the procedure that I have provided informed consent.    Order Specific Question:   Product(s)    Answer:   All Product(s)  . Care order/instruction    Transfuse Parameters    Standing Status:   Future    Standing Expiration Date:   11/04/2020  . Type and screen    Standing Status:   Future    Number of Occurrences:   1    Standing Expiration Date:   11/04/2020  . Prepare  RBC (crossmatch)    Standing Status:   Standing    Number of Occurrences:   1    Order Specific Question:   # of Units    Answer:   2 units    Order Specific Question:   Transfusion Indications    Answer:   Symptomatic Anemia    Order Specific Question:   Special Requirements    Answer:   Irradiated    Order Specific Question:   Number of Units to Keep Ahead    Answer:   NO units ahead    Order Specific Question:   If emergent  release call blood bank    Answer:   Not emergent release  . Sample to Blood Bank    Standing Status:   Standing    Number of Occurrences:   33    Standing Expiration Date:   11/04/2020    All questions were answered. The patient knows to call the clinic with any problems, questions or concerns. The total time spent in the appointment was 40 minutes encounter with patients including review of chart and various tests results, discussions about plan of care and coordination of care plan   Heath Lark, MD 11/05/2019 12:53 PM  INTERVAL HISTORY: Please see below for problem oriented charting. Jennifer Santos is seen for further follow-up and chemotherapy today Jennifer Santos has not been doing well Jennifer Santos continues to have significant weight loss Jennifer Santos had severe nausea, vomiting and dehydration Jennifer Santos also have frequent loose stool Jennifer Santos pain is well controlled Jennifer Santos has very rare 1 spot of bleeding in Jennifer Santos urine but no other signs of bleeding elsewhere No recent fever or chills Jennifer Santos chronic back pain is stable Jennifer Santos felt weak overall  SUMMARY OF ONCOLOGIC HISTORY: Oncology History Overview Note  PD-L1 0%   Cervical ca ([place])  11/03/2018 Initial Diagnosis   Jennifer Santos presented with postmenopausal bleeding since 2019 (at least 1 year) and weight loss. Had prior abnormal PAP smear, last done in 2010   11/04/2018 Pathology Results   Endometrium, biopsy - INVASIVE MODERATELY DIFFERENTIATED SQUAMOUS CELL CARCINOMA. SEE NOTE Diagnosis Note Squamous cell carcinoma arises in a background of high-grade dysplasia with possible glandular involvement. Tumor cells are positive for CK5/6, consistent with the above diagnosis. Immunohistochemical stain for p16 is diffusely positive in the tumor cells.   11/17/2018 Cancer Staging   Staging form: Cervix Uteri, AJCC 8th Edition - Clinical: FIGO Stage IIIB, calculated as Stage IVB (cT3b, cN0, cM1) - Signed by Heath Lark, MD on 06/01/2019   11/20/2018 PET scan   IMPRESSION: 1. Cervical lesion with  direct extra cervical extension posterolaterally on the right is markedly hypermetabolic consistent with known neoplasm. 2. 7 x 11 mm posterior right pelvic irregular nodule is hypermetabolic consistent with metastatic disease. 3. Para-aortic soft tissue in the region of the bifurcation and 8 mm short axis left pelvic sidewall lymph node show marked hypermetabolism, consistent with metastatic involvement. 4. Low level FDG uptake identified in a unenlarged AP window lymph node of the mediastinum with focal FDG uptake identified in each hilum. While no underlying hilar lymph nodes evident on this noncontrast CT exam, tiny lymph nodes were visible in the hilar regions on diagnostic CT of 11/17/2018. While this may be reactive given the low level uptake, metastatic disease cannot be definitively excluded. 5. Focus of hypermetabolic activity in the spinal canal at the T11-12 level. No underlying mass lesion evident on noncontrast CT imaging. Thoracic spine MRI without and with contrast may be warranted to  further evaluate. 6. Stable moderate right hydroureteronephrosis.   11/25/2018 Procedure   Placement of single lumen port a cath via right internal jugular vein. The catheter tip lies at the cavo-atrial junction. A power injectable port a cath was placed and is ready for immediate use.    11/26/2018 Imaging   MRI spine Negative for metastatic disease. No abnormality to correlate with potential lesion within the spinal canal at T11-12 as seen on prior CT is identified.  Remote, mild superior endplate compression fracture of T12.  Widely patent central canal and foramina throughout.  Right hydronephrosis as seen on prior studies.   11/27/2018 - 01/13/2019 Chemotherapy   The patient had weekly cisplatin with radiation. Last 3 doses were delayed and dose modified due to pancytopenia   05/24/2019 PET scan   1. Mixed appearance, with they were resolution of accentuated activity in the cervix and  paracervical tissues as well as resolved pelvic adenopathy; but with at least two and possibly three new hypermetabolic lesions along the liver capsule suspicious for potential metastatic lesions. 2. New vertical activity in both sacral ala, with appearance and pattern favoring sacral insufficiency fracture. 3. Other imaging findings of potential clinical significance: Aortic Atherosclerosis (ICD10-I70.0). Left anterior descending coronary artery atherosclerotic calcification. Presacral density is likely treatment related. Stable T12 anterior wedge compression fracture.    Genetic Testing   Patient has genetic testing done for PD-L1 on accession [id number]. Results revealed patient has the following: PD-L1 0%   06/07/2019 -  Chemotherapy   The patient had carboplatin, taxol and Avastin for chemotherapy treatment.     08/23/2019 PET scan   1. The patient had two hypermetabolic lesions along the liver capsule on the prior study. One of these has resolved. The other measures smaller today but demonstrates mildly increased hypermetabolism. A third region of questioned capsular hypermetabolism along the hepatic dome previously is not hypermetabolic today. 2. Similar to slight decrease in FDG uptake associated with the cervix. 3. No new hypermetabolic disease in the neck, chest, abdomen, or pelvis. 4.  Aortic Atherosclerois (ICD10-170.0)     REVIEW OF SYSTEMS:   Constitutional: Denies fevers, chills Eyes: Denies blurriness of vision Ears, nose, mouth, throat, and face: Denies mucositis or sore throat Respiratory: Denies cough, dyspnea or wheezes Cardiovascular: Denies palpitation, chest discomfort or lower extremity swelling Skin: Denies abnormal skin rashes Lymphatics: Denies new lymphadenopathy or easy bruising Behavioral/Psych: Mood is stable, no new changes  All other systems were reviewed with the patient and are negative.  I have reviewed the past medical history, past surgical history,  social history and family history with the patient and they are unchanged from previous note.  ALLERGIES:  has No Known Allergies.  MEDICATIONS:  Current Outpatient Medications  Medication Sig Dispense Refill  . acetaminophen (TYLENOL) 500 MG tablet Take 1,000 mg by mouth every 6 (six) hours as needed (pain).    Marland Kitchen amLODipine (NORVASC) 10 MG tablet Take 1 tablet (10 mg total) by mouth daily. 30 tablet 11  . lidocaine-prilocaine (EMLA) cream Apply 1 application topically as needed.    . mirtazapine (REMERON) 15 MG tablet Take 1 tablet (15 mg total) by mouth at bedtime. 30 tablet 1  . morphine (MSIR) 15 MG tablet Take 1 tablet (15 mg total) by mouth every 4 (four) hours as needed for severe pain. 60 tablet 0  . ondansetron (ZOFRAN) 8 MG tablet Take 8 mg by mouth every 8 (eight) hours as needed.    . prochlorperazine (COMPAZINE) 10 MG  tablet Take 1 tablet (10 mg total) by mouth every 6 (six) hours as needed. 30 tablet 11  . tiZANidine (ZANAFLEX) 2 MG tablet Take 2 mg by mouth at bedtime as needed.      No current facility-administered medications for this visit.   Facility-Administered Medications Ordered in Other Visits  Medication Dose Route Frequency Provider Last Rate Last Admin  . 0.9 %  sodium chloride infusion (Manually program via Guardrails IV Fluids)  250 mL Intravenous Once Alvy Bimler, Aleni Andrus, MD      . heparin lock flush 100 unit/mL  250 Units Intracatheter PRN Montrez Marietta, MD      . sodium chloride flush (NS) 0.9 % injection 10 mL  10 mL Intracatheter PRN Alvy Bimler, Garnett Rekowski, MD        PHYSICAL EXAMINATION: ECOG PERFORMANCE STATUS: 2 - Symptomatic, <50% confined to bed  Vitals:   11/05/19 1050  BP: 121/76  Pulse: (!) 114  Temp: 98.1 F (36.7 C)  SpO2: 100%   Filed Weights   11/05/19 1050  Weight: 106 lb 12.8 oz (48.4 kg)    GENERAL:alert, no distress and comfortable.  Jennifer Santos looks thin and cachectic SKIN: skin color, texture, turgor are normal, no rashes or significant lesions EYES:  normal, Conjunctiva are pink and non-injected, sclera clear OROPHARYNX:no exudate, no erythema and lips, buccal mucosa, and tongue normal  NECK: supple, thyroid normal size, non-tender, without nodularity LYMPH:  no palpable lymphadenopathy in the cervical, axillary or inguinal LUNGS: clear to auscultation and percussion with normal breathing effort HEART: Noted tachycardia, no murmurs and no lower extremity edema ABDOMEN:abdomen soft, non-tender and normal bowel sounds Musculoskeletal:no cyanosis of digits and no clubbing  NEURO: alert & oriented x 3 with fluent speech, no focal motor/sensory deficits  LABORATORY DATA:  I have reviewed the data as listed    Component Value Date/Time   NA 141 11/05/2019 1038   K 3.5 11/05/2019 1038   CL 107 11/05/2019 1038   CO2 19 (L) 11/05/2019 1038   GLUCOSE 125 (H) 11/05/2019 1038   BUN 19 11/05/2019 1038   CREATININE 0.83 11/05/2019 1038   CALCIUM 9.1 11/05/2019 1038   PROT 6.8 11/05/2019 1038   ALBUMIN 3.7 11/05/2019 1038   AST 13 (L) 11/05/2019 1038   ALT 7 11/05/2019 1038   ALKPHOS 74 11/05/2019 1038   BILITOT 0.3 11/05/2019 1038   GFRNONAA >60 11/05/2019 1038   GFRAA >60 11/05/2019 1038    No results found for: SPEP, UPEP  Lab Results  Component Value Date   WBC 3.6 (L) 11/05/2019   NEUTROABS 2.8 11/05/2019   HGB 5.6 (LL) 11/05/2019   HCT 18.6 (L) 11/05/2019   MCV 111.4 (H) 11/05/2019   PLT 47 (L) 11/05/2019      Chemistry      Component Value Date/Time   NA 141 11/05/2019 1038   K 3.5 11/05/2019 1038   CL 107 11/05/2019 1038   CO2 19 (L) 11/05/2019 1038   BUN 19 11/05/2019 1038   CREATININE 0.83 11/05/2019 1038      Component Value Date/Time   CALCIUM 9.1 11/05/2019 1038   ALKPHOS 74 11/05/2019 1038   AST 13 (L) 11/05/2019 1038   ALT 7 11/05/2019 1038   BILITOT 0.3 11/05/2019 1038

## 2019-11-05 NOTE — Assessment & Plan Note (Signed)
She has severe pancytopenia and she is symptomatic In addition to vitamin B12 deficiency, I suspect she might have bony metastasis causing severe pancytopenia We will hold treatment today I will give her B12 injection today  We discussed some of the risks, benefits, and alternatives of blood transfusions. The patient is symptomatic from anemia and the hemoglobin level is critically low.  Some of the side-effects to be expected including risks of transfusion reactions, chills, infection, syndrome of volume overload and risk of hospitalization from various reasons and the patient is willing to proceed and went ahead to sign consent today. She will receive 2 units of blood today She does not need platelet transfusion support I plan to recheck her blood again when I see her next time

## 2019-11-05 NOTE — Telephone Encounter (Signed)
Scheduled appt per 4/2 sch message - unable to reach pt . Left message with appt date and time

## 2019-11-05 NOTE — Progress Notes (Signed)
Reported critical hemoglobin 5.6 to Dr. Alvy Bimler.

## 2019-11-05 NOTE — Patient Instructions (Signed)
https://www.[facility].org/donate-blood/blood-donation-process/what-happens-to-donated-blood/blood-transfusions/types-of-blood-transfusions.html"> https://www.[facility].org/donate-blood/blood-donation-process/what-happens-to-donated-blood/blood-transfusions/risks-complications.html">  Blood Transfusion, Adult, Care After This sheet gives you information about how to care for yourself after your procedure. Your health care provider may also give you more specific instructions. If you have problems or questions, contact your health care provider. What can I expect after the procedure? After the procedure, it is common to have:  Bruising and soreness where the IV was inserted.  A fever or chills on the day of the procedure. This may be your body's response to the new blood cells received.  A headache. Follow these instructions at home: IV insertion site care      Follow instructions from your health care provider about how to take care of your IV insertion site. Make sure you: ? Wash your hands with soap and water before and after you change your bandage (dressing). If soap and water are not available, use hand sanitizer. ? Change your dressing as told by your health care provider.  Check your IV insertion site every day for signs of infection. Check for: ? Redness, swelling, or pain. ? Bleeding from the site. ? Warmth. ? Pus or a bad smell. General instructions  Take over-the-counter and prescription medicines only as told by your health care provider.  Rest as told by your health care provider.  Return to your normal activities as told by your health care provider.  Keep all follow-up visits as told by your health care provider. This is important. Contact a health care provider if:  You have itching or red, swollen areas of skin (hives).  You feel anxious.  You feel weak after doing your normal activities.  You have redness, swelling, warmth, or pain around the IV  insertion site.  You have blood coming from the IV insertion site that does not stop with pressure.  You have pus or a bad smell coming from your IV insertion site. Get help right away if:  You have symptoms of a serious allergic or immune system reaction, including: ? Trouble breathing or shortness of breath. ? Swelling of the face or feeling flushed. ? Fever or chills. ? Pain in the head, back, or chest. ? Dark urine or blood in the urine. ? Widespread rash. ? Fast heartbeat. ? Feeling dizzy or light-headed. If you receive your blood transfusion in an outpatient setting, you will be told whom to contact to report any reactions. These symptoms may represent a serious problem that is an emergency. Do not wait to see if the symptoms will go away. Get medical help right away. Call your local emergency services (911 in the [place] Do not drive yourself to the hospital. Summary  Bruising and tenderness around the IV insertion site are common.  Check your IV insertion site every day for signs of infection.  Rest as told by your health care provider. Return to your normal activities as told by your health care provider.  Get help right away for symptoms of a serious allergic or immune system reaction to blood transfusion. This information is not intended to replace advice given to you by your health care provider. Make sure you discuss any questions you have with your health care provider. Document Revised: 01/14/2019 Document Reviewed: 01/14/2019 Elsevier Patient Education  2020 [employer]  

## 2019-11-06 LAB — TYPE AND SCREEN
ABO/RH(D): O NEG
Antibody Screen: NEGATIVE
Unit division: 0
Unit division: 0

## 2019-11-06 LAB — BPAM RBC
Blood Product Expiration Date: 202104302359
Blood Product Expiration Date: 202104302359
ISSUE DATE / TIME: 202104021316
ISSUE DATE / TIME: 202104021316
Unit Type and Rh: 9500
Unit Type and Rh: 9500

## 2019-11-09 ENCOUNTER — Telehealth: Payer: Self-pay | Admitting: Hematology and Oncology

## 2019-11-09 NOTE — Telephone Encounter (Signed)
R/s appt per 4/5 sch message- unable to reach pt  .left message with date and time of new appt

## 2019-11-10 ENCOUNTER — Other Ambulatory Visit: Payer: Self-pay | Admitting: Hematology and Oncology

## 2019-11-10 NOTE — Progress Notes (Signed)
LATE ENTRY-Critical value called in for Hgb 5.6.  Reported result to Dr. Calton Dach nurse Hassan Rowan.   Gardiner Rhyme, RN

## 2019-11-15 ENCOUNTER — Other Ambulatory Visit: Payer: BLUE CROSS/BLUE SHIELD

## 2019-11-15 ENCOUNTER — Ambulatory Visit: Payer: BLUE CROSS/BLUE SHIELD | Admitting: Hematology and Oncology

## 2019-11-15 ENCOUNTER — Ambulatory Visit: Payer: BLUE CROSS/BLUE SHIELD

## 2019-11-15 NOTE — Progress Notes (Signed)
Pharmacist Chemotherapy Monitoring - Follow Up Assessment    I verify that I have reviewed each item in the below checklist:  . Regimen for the patient is scheduled for the appropriate day and plan matches scheduled date. Marland Kitchen Appropriate non-routine labs are ordered dependent on drug ordered. . If applicable, additional medications reviewed and ordered per protocol based on lifetime cumulative doses and/or treatment regimen.   Plan for follow-up and/or issues identified: No . I-vent associated with next due treatment: No . MD and/or nursing notified: No  Romualdo Bolk [facility] 11/15/2019 4:26 PM

## 2019-11-16 ENCOUNTER — Other Ambulatory Visit: Payer: Self-pay

## 2019-11-16 ENCOUNTER — Ambulatory Visit (HOSPITAL_COMMUNITY)
Admission: RE | Admit: 2019-11-16 | Discharge: 2019-11-16 | Disposition: A | Discharge status: Home / Self Care | Payer: BLUE CROSS/BLUE SHIELD | Source: Ambulatory Visit | Attending: Hematology and Oncology | Admitting: Hematology and Oncology

## 2019-11-16 DIAGNOSIS — C530 Malignant neoplasm of endocervix: Secondary | ICD-10-CM | POA: Insufficient documentation

## 2019-11-16 LAB — GLUCOSE, CAPILLARY: Glucose-Capillary: 103 mg/dL — ABNORMAL HIGH (ref 70–99)

## 2019-11-16 MED ORDER — FLUDEOXYGLUCOSE F - 18 (FDG) INJECTION
5.3000 | Freq: Once | INTRAVENOUS | Status: AC | PRN
  Administered 2019-11-16: 5.3 via INTRAVENOUS

## 2019-11-18 ENCOUNTER — Inpatient Hospital Stay: Payer: BLUE CROSS/BLUE SHIELD

## 2019-11-18 ENCOUNTER — Inpatient Hospital Stay: Payer: BLUE CROSS/BLUE SHIELD | Admitting: Hematology and Oncology

## 2019-11-18 ENCOUNTER — Encounter: Payer: Self-pay | Admitting: Hematology and Oncology

## 2019-11-18 ENCOUNTER — Telehealth: Payer: Self-pay

## 2019-11-18 ENCOUNTER — Telehealth: Payer: Self-pay | Admitting: Hematology and Oncology

## 2019-11-18 ENCOUNTER — Other Ambulatory Visit: Payer: Self-pay

## 2019-11-18 ENCOUNTER — Telehealth: Payer: Self-pay | Admitting: Nurse Practitioner

## 2019-11-18 DIAGNOSIS — Z7189 Other specified counseling: Secondary | ICD-10-CM

## 2019-11-18 DIAGNOSIS — C530 Malignant neoplasm of endocervix: Secondary | ICD-10-CM

## 2019-11-18 DIAGNOSIS — C539 Malignant neoplasm of cervix uteri, unspecified: Secondary | ICD-10-CM | POA: Diagnosis not present

## 2019-11-18 DIAGNOSIS — C787 Secondary malignant neoplasm of liver and intrahepatic bile duct: Secondary | ICD-10-CM | POA: Diagnosis not present

## 2019-11-18 DIAGNOSIS — D61818 Other pancytopenia: Secondary | ICD-10-CM | POA: Diagnosis not present

## 2019-11-18 DIAGNOSIS — G893 Neoplasm related pain (acute) (chronic): Secondary | ICD-10-CM

## 2019-11-18 DIAGNOSIS — C531 Malignant neoplasm of exocervix: Secondary | ICD-10-CM

## 2019-11-18 LAB — CMP (CANCER CENTER ONLY)
ALT: 7 U/L (ref 0–44)
AST: 13 U/L — ABNORMAL LOW (ref 15–41)
Albumin: 3.7 g/dL (ref 3.5–5.0)
Alkaline Phosphatase: 85 U/L (ref 38–126)
Anion gap: 12 (ref 5–15)
BUN: 14 mg/dL (ref 6–20)
CO2: 26 mmol/L (ref 22–32)
Calcium: 9.6 mg/dL (ref 8.9–10.3)
Chloride: 99 mmol/L (ref 98–111)
Creatinine: 0.75 mg/dL (ref 0.44–1.00)
GFR, Est AFR Am: >60 mL/min (ref >60)
GFR, Estimated: >60 mL/min (ref >60)
Glucose, Bld: 79 mg/dL (ref 70–99)
Potassium: 4.2 mmol/L (ref 3.5–5.1)
Sodium: 137 mmol/L (ref 135–145)
Total Bilirubin: 0.3 mg/dL (ref 0.3–1.2)
Total Protein: 7.3 g/dL (ref 6.5–8.1)

## 2019-11-18 LAB — CBC WITH DIFFERENTIAL (CANCER CENTER ONLY)
Abs Immature Granulocytes: 0.01 10*3/uL (ref 0.00–0.07)
Basophils Absolute: 0 10*3/uL (ref 0.0–0.1)
Basophils Relative: 0 %
Eosinophils Absolute: 0 10*3/uL (ref 0.0–0.5)
Eosinophils Relative: 1 %
HCT: 31 % — ABNORMAL LOW (ref 36.0–46.0)
Hemoglobin: 9.9 g/dL — ABNORMAL LOW (ref 12.0–15.0)
Immature Granulocytes: 0 %
Lymphocytes Relative: 18 %
Lymphs Abs: 0.6 10*3/uL — ABNORMAL LOW (ref 0.7–4.0)
MCH: 31.3 pg (ref 26.0–34.0)
MCHC: 31.9 g/dL (ref 30.0–36.0)
MCV: 98.1 fL (ref 80.0–100.0)
Monocytes Absolute: 0.4 10*3/uL (ref 0.1–1.0)
Monocytes Relative: 12 %
Neutro Abs: 2.4 10*3/uL (ref 1.7–7.7)
Neutrophils Relative %: 69 %
Platelet Count: 109 10*3/uL — ABNORMAL LOW (ref 150–400)
RBC: 3.16 MIL/uL — ABNORMAL LOW (ref 3.87–5.11)
RDW: 17.6 % — ABNORMAL HIGH (ref 11.5–15.5)
WBC Count: 3.5 10*3/uL — ABNORMAL LOW (ref 4.0–10.5)
nRBC: 0 % (ref 0.0–0.2)

## 2019-11-18 LAB — SAMPLE TO BLOOD BANK

## 2019-11-18 LAB — TOTAL PROTEIN, URINE DIPSTICK: Protein, ur: <30 mg/dL — AB

## 2019-11-18 NOTE — Telephone Encounter (Signed)
No 4/15 los/sch msg. No changes made to pt's schedule.  

## 2019-11-18 NOTE — Telephone Encounter (Signed)
Called patient to schedule Palliative Consult, no answer and I was unable to leave a message due to mailbox was full.    I then sent patient an email requesting a call back to schedule.

## 2019-11-18 NOTE — Telephone Encounter (Signed)
[name] with [facility] called back. They have the information for palliative care referral and will contact Ms. Jennifer Santos.

## 2019-11-18 NOTE — Assessment & Plan Note (Signed)
I have reviewed multiple imaging studies with the patient Unfortunately, she has disease progression The patient continues to lose weight At this point in time, I do not recommend further chemotherapy We discussed the role of palliative care/hospice referral and she agreed

## 2019-11-18 NOTE — Assessment & Plan Note (Signed)
We had numerous goals of care discussions in the past She understood that further chemotherapy will not likely to benefit her much, but at the expense of quality of life and high risk of complications She is at peace of not pursuing further treatment I recommend palliative care/hospice referral and she agreed We discussed CODE STATUS and she agreed to DNR I gave her a DNR order to take home

## 2019-11-18 NOTE — Assessment & Plan Note (Signed)
She has enlarging liver lesions secondary to disease progression The patient tolerated treatment very poorly This is the most likely cause of her pain and cachexia We will observe only for now and recommend palliative care and she agrees

## 2019-11-18 NOTE — Telephone Encounter (Signed)
Called and left palliative care referral information on voicemail. Ask for a call back to the office.

## 2019-11-18 NOTE — Assessment & Plan Note (Signed)
Her cancer pain is well controlled with immediate release morphine She will continue the same She does not need refill right now and we discussed narcotic refill policy

## 2019-11-18 NOTE — Progress Notes (Signed)
[place] OFFICE PROGRESS NOTE  Patient Care Team: Marguerita Merles, MD as PCP - General (Family Medicine) Gae Dry, MD as Referring Physician (Obstetrics and Gynecology) Awanda Mink Craige Cotta, RN as Oncology Nurse Navigator (Oncology)  ASSESSMENT & PLAN:  Cervical ca [facility]) I have reviewed multiple imaging studies with the patient Unfortunately, she has disease progression The patient continues to lose weight At this point in time, I do not recommend further chemotherapy We discussed the role of palliative care/hospice referral and she agreed  Metastasis to liver [facility]) She has enlarging liver lesions secondary to disease progression The patient tolerated treatment very poorly This is the most likely cause of her pain and cachexia We will observe only for now and recommend palliative care and she agrees  Cancer associated pain Her cancer pain is well controlled with immediate release morphine She will continue the same She does not need refill right now and we discussed narcotic refill policy  Pancytopenia, acquired ([place]) Her blood count is recovering well since last treatment She does not need transfusion support  Goals of care, counseling/discussion We had numerous goals of care discussions in the past She understood that further chemotherapy will not likely to benefit her much, but at the expense of quality of life and high risk of complications She is at peace of not pursuing further treatment I recommend palliative care/hospice referral and she agreed We discussed CODE STATUS and she agreed to DNR I gave her a DNR order to take home   No orders of the defined types were placed in this encounter.   All questions were answered. The patient knows to call the clinic with any problems, questions or concerns. The total time spent in the appointment was 30 minutes encounter with patients including review of chart and various tests results, discussions about plan  of care and coordination of care plan   Heath Lark, MD 11/18/2019 11:03 AM  INTERVAL HISTORY: Please see below for problem oriented charting. She returns to review test results Since blood transfusion, she felt better No signs of bleeding She continues to have intermittent abdominal pain, well controlled with immediate release morphine No recent nausea or constipation  SUMMARY OF ONCOLOGIC HISTORY: Oncology History Overview Note  PD-L1 0%   Cervical ca ([place])  11/03/2018 Initial Diagnosis   She presented with postmenopausal bleeding since 2019 (at least 1 year) and weight loss. Had prior abnormal PAP smear, last done in 2010   11/04/2018 Pathology Results   Endometrium, biopsy - INVASIVE MODERATELY DIFFERENTIATED SQUAMOUS CELL CARCINOMA. SEE NOTE Diagnosis Note Squamous cell carcinoma arises in a background of high-grade dysplasia with possible glandular involvement. Tumor cells are positive for CK5/6, consistent with the above diagnosis. Immunohistochemical stain for p16 is diffusely positive in the tumor cells.   11/17/2018 Cancer Staging   Staging form: Cervix Uteri, AJCC 8th Edition - Clinical: FIGO Stage IIIB, calculated as Stage IVB (cT3b, cN0, cM1) - Signed by Heath Lark, MD on 06/01/2019   11/20/2018 PET scan   IMPRESSION: 1. Cervical lesion with direct extra cervical extension posterolaterally on the right is markedly hypermetabolic consistent with known neoplasm. 2. 7 x 11 mm posterior right pelvic irregular nodule is hypermetabolic consistent with metastatic disease. 3. Para-aortic soft tissue in the region of the bifurcation and 8 mm short axis left pelvic sidewall lymph node show marked hypermetabolism, consistent with metastatic involvement. 4. Low level FDG uptake identified in a unenlarged AP window lymph node of the mediastinum with focal  FDG uptake identified in each hilum. While no underlying hilar lymph nodes evident on this noncontrast CT exam, tiny lymph nodes were  visible in the hilar regions on diagnostic CT of 11/17/2018. While this may be reactive given the low level uptake, metastatic disease cannot be definitively excluded. 5. Focus of hypermetabolic activity in the spinal canal at the T11-12 level. No underlying mass lesion evident on noncontrast CT imaging. Thoracic spine MRI without and with contrast may be warranted to further evaluate. 6. Stable moderate right hydroureteronephrosis.   11/25/2018 Procedure   Placement of single lumen port a cath via right internal jugular vein. The catheter tip lies at the cavo-atrial junction. A power injectable port a cath was placed and is ready for immediate use.    11/26/2018 Imaging   MRI spine Negative for metastatic disease. No abnormality to correlate with potential lesion within the spinal canal at T11-12 as seen on prior CT is identified.  Remote, mild superior endplate compression fracture of T12.  Widely patent central canal and foramina throughout.  Right hydronephrosis as seen on prior studies.   11/27/2018 - 01/13/2019 Chemotherapy   The patient had weekly cisplatin with radiation. Last 3 doses were delayed and dose modified due to pancytopenia   05/24/2019 PET scan   1. Mixed appearance, with they were resolution of accentuated activity in the cervix and paracervical tissues as well as resolved pelvic adenopathy; but with at least two and possibly three new hypermetabolic lesions along the liver capsule suspicious for potential metastatic lesions. 2. New vertical activity in both sacral ala, with appearance and pattern favoring sacral insufficiency fracture. 3. Other imaging findings of potential clinical significance: Aortic Atherosclerosis (ICD10-I70.0). Left anterior descending coronary artery atherosclerotic calcification. Presacral density is likely treatment related. Stable T12 anterior wedge compression fracture.    Genetic Testing   Patient has genetic testing done for PD-L1 on  accession [id number]. Results revealed patient has the following: PD-L1 0%   06/07/2019 -  Chemotherapy   The patient had carboplatin, taxol and Avastin for chemotherapy treatment.     08/23/2019 PET scan   1. The patient had two hypermetabolic lesions along the liver capsule on the prior study. One of these has resolved. The other measures smaller today but demonstrates mildly increased hypermetabolism. A third region of questioned capsular hypermetabolism along the hepatic dome previously is not hypermetabolic today. 2. Similar to slight decrease in FDG uptake associated with the cervix. 3. No new hypermetabolic disease in the neck, chest, abdomen, or pelvis. 4.  Aortic Atherosclerois (ICD10-170.0)   11/16/2019 PET scan   1. Two enlarging hypermetabolic soft tissue density lesions along the capsular surface of the LEFT hepatic lobe. Findings consistent with edema metastatic disease progression/recurrence. No new hepatic capsular lesions identified. 2. Focus of metabolic activity along the posterior wall of the lower uterine segment/cervix is difficult to localize on the CT portion exam. Cannot exclude local recurrence.       REVIEW OF SYSTEMS: She complains of right upper quadrant pain Eyes: Denies blurriness of vision Ears, nose, mouth, throat, and face: Denies mucositis or sore throat Respiratory: Denies cough, dyspnea or wheezes Cardiovascular: Denies palpitation, chest discomfort or lower extremity swelling Gastrointestinal:  Denies nausea, heartburn or change in bowel habits Skin: Denies abnormal skin rashes Lymphatics: Denies new lymphadenopathy or easy bruising Neurological:Denies numbness, tingling or new weaknesses Behavioral/Psych: Mood is stable, no new changes  All other systems were reviewed with the patient and are negative.  I have reviewed the past  medical history, past surgical history, social history and family history with the patient and they are unchanged from  previous note.  ALLERGIES:  has No Known Allergies.  MEDICATIONS:  Current Outpatient Medications  Medication Sig Dispense Refill  . acetaminophen (TYLENOL) 500 MG tablet Take 1,000 mg by mouth every 6 (six) hours as needed (pain).    Marland Kitchen amLODipine (NORVASC) 10 MG tablet Take 1 tablet (10 mg total) by mouth daily. 30 tablet 11  . lidocaine-prilocaine (EMLA) cream Apply 1 application topically as needed.    . mirtazapine (REMERON) 15 MG tablet TAKE 1 TABLET BY MOUTH EVERYDAY AT BEDTIME 90 tablet 1  . morphine (MSIR) 15 MG tablet Take 1 tablet (15 mg total) by mouth every 4 (four) hours as needed for severe pain. 60 tablet 0  . ondansetron (ZOFRAN) 8 MG tablet Take 8 mg by mouth every 8 (eight) hours as needed.    . prochlorperazine (COMPAZINE) 10 MG tablet Take 1 tablet (10 mg total) by mouth every 6 (six) hours as needed. 30 tablet 11  . tiZANidine (ZANAFLEX) 2 MG tablet Take 2 mg by mouth at bedtime as needed.      No current facility-administered medications for this visit.    PHYSICAL EXAMINATION: ECOG PERFORMANCE STATUS: 1 - Symptomatic but completely ambulatory  Vitals:   11/18/19 1018  BP: 133/84  Pulse: 91  Resp: 17  Temp: 98 F (36.7 C)  SpO2: 100%   Filed Weights   11/18/19 1018  Weight: 105 lb 9.6 oz (47.9 kg)    GENERAL:alert, no distress and comfortable NEURO: alert & oriented x 3 with fluent speech, no focal motor/sensory deficits  LABORATORY DATA:  I have reviewed the data as listed    Component Value Date/Time   NA 137 11/18/2019 1005   K 4.2 11/18/2019 1005   CL 99 11/18/2019 1005   CO2 26 11/18/2019 1005   GLUCOSE 79 11/18/2019 1005   BUN 14 11/18/2019 1005   CREATININE 0.75 11/18/2019 1005   CALCIUM 9.6 11/18/2019 1005   PROT 7.3 11/18/2019 1005   ALBUMIN 3.7 11/18/2019 1005   AST 13 (L) 11/18/2019 1005   ALT 7 11/18/2019 1005   ALKPHOS 85 11/18/2019 1005   BILITOT 0.3 11/18/2019 1005   GFRNONAA >60 11/18/2019 1005   GFRAA >60 11/18/2019 1005     No results found for: SPEP, UPEP  Lab Results  Component Value Date   WBC 3.5 (L) 11/18/2019   NEUTROABS 2.4 11/18/2019   HGB 9.9 (L) 11/18/2019   HCT 31.0 (L) 11/18/2019   MCV 98.1 11/18/2019   PLT 109 (L) 11/18/2019      Chemistry      Component Value Date/Time   NA 137 11/18/2019 1005   K 4.2 11/18/2019 1005   CL 99 11/18/2019 1005   CO2 26 11/18/2019 1005   BUN 14 11/18/2019 1005   CREATININE 0.75 11/18/2019 1005      Component Value Date/Time   CALCIUM 9.6 11/18/2019 1005   ALKPHOS 85 11/18/2019 1005   AST 13 (L) 11/18/2019 1005   ALT 7 11/18/2019 1005   BILITOT 0.3 11/18/2019 1005       RADIOGRAPHIC STUDIES: I have reviewed multiple imaging studies with the patient I have personally reviewed the radiological images as listed and agreed with the findings in the report. NM PET Image Restag (PS) Skull Base To Thigh  Result Date: 11/16/2019 CLINICAL DATA:  Subsequent treatment strategy for cervical cancer. EXAM: NUCLEAR MEDICINE PET SKULL BASE  TO THIGH TECHNIQUE: 5.3 mCi F-18 FDG was injected intravenously. Full-ring PET imaging was performed from the skull base to thigh after the radiotracer. CT data was obtained and used for attenuation correction and anatomic localization. Fasting blood glucose: 101 mg/dl COMPARISON:  PET-CT 08/22/2018, 05/24/2019 FINDINGS: Mediastinal blood pool activity: SUV max 2.8 Liver activity: SUV max NA NECK: No hypermetabolic lymph nodes in the neck. Incidental CT findings: none CHEST: No hypermetabolic mediastinal or hilar nodes. No suspicious pulmonary nodules on the CT scan. Incidental CT findings: none ABDOMEN/PELVIS: Mass lesion along the anterior capsule of the central LEFT hepatic lobe measures 30 mm increased from 15 mm. Lesion has intense peripheral metabolic activity with SUV max equal 7.7 similar to SUV max equal 6.3 on comparison exam. A second lesion along the anterior capsule of the LEFT hepatic lobe adjacent to the falciform  ligament measures 8 mm (image 98/4) with SUV max equal 6.6. This lesion is barely discernible at 1 mm on comparison exam without radiotracer activity. Lesion was present on more remote scan of 05/24/2019. No new lesions within the liver parenchyma. No hypermetabolic abdominal or pelvic lymph nodes. Along the posterior wall of the lower uterine segment/cervix there is a focus of metabolic activity SUV max equal 5.6 found on image 155 of the fused data set. Difficult to localize this lesion on CT. SKELETON: No evidence skeletal metastasis. Incidental CT findings: none IMPRESSION: 1. Two enlarging hypermetabolic soft tissue density lesions along the capsular surface of the LEFT hepatic lobe. Findings consistent with edema metastatic disease progression/recurrence. No new hepatic capsular lesions identified. 2. Focus of metabolic activity along the posterior wall of the lower uterine segment/cervix is difficult to localize on the CT portion exam. Cannot exclude local recurrence. Electronically Signed   By: Suzy Bouchard M.D.   On: 11/16/2019 11:12

## 2019-11-18 NOTE — Assessment & Plan Note (Signed)
Her blood count is recovering well since last treatment She does not need transfusion support

## 2019-11-19 ENCOUNTER — Telehealth: Payer: Self-pay | Admitting: Nurse Practitioner

## 2019-11-19 NOTE — Telephone Encounter (Signed)
Called patient to schedule the Palliative Consult, no answer and unable to leave a message due to mailbox was full.

## 2019-11-23 ENCOUNTER — Telehealth: Payer: Self-pay | Admitting: Adult Health Nurse Practitioner

## 2019-11-23 NOTE — Telephone Encounter (Signed)
Spoke with patient regarding Palliative services and she was in agreement with this.  I have scheduled an In-person Consult for 11/29/19 @ 3:30 PM

## 2019-11-29 ENCOUNTER — Telehealth: Payer: Self-pay

## 2019-11-29 ENCOUNTER — Other Ambulatory Visit: Payer: BLUE CROSS/BLUE SHIELD | Admitting: Adult Health Nurse Practitioner

## 2019-11-29 ENCOUNTER — Other Ambulatory Visit: Payer: Self-pay

## 2019-11-29 DIAGNOSIS — C530 Malignant neoplasm of endocervix: Secondary | ICD-10-CM

## 2019-11-29 DIAGNOSIS — Z515 Encounter for palliative care: Secondary | ICD-10-CM

## 2019-11-29 NOTE — Telephone Encounter (Signed)
She called requesting refill on Morphine to [facility]. She will have her son up Rx. Palliative care is coming out later today.

## 2019-11-29 NOTE — Progress Notes (Signed)
[facility] Consult Note Telephone: [phone]  Fax: [phone]  PATIENT NAME: Jennifer Santos DOB: [birth year] MRN: [id number]  PRIMARY CARE PROVIDER:   Marguerita Merles, MD  REFERRING PROVIDER:  Dr. Alvy Bimler, oncology  RESPONSIBLE PARTY:   Self [phone] [email or web address]    RECOMMENDATIONS and PLAN:  1.  Advanced care planning.  Patient is a DNR.  2.  Cervical cancer with mets to liver. Patient is being followed by Dr. Alvy Bimler. Patient started radiation and chemo April 2020.  She has developed pancytopenia. She did require 2 units of PRBC on 11/05/19.  Mets to the liver has caused pain to right side and right shoulder.  She gets relief with Morphine 15 mg which she takes about 4 pills a day. States that she was only taking it when the pain got real bad until Dr. Alvy Bimler talked with her about staying ahead of the pain.  She states having anxiety.  Admits to a friend sharing xanax with her and that this does give her relief.  Patient's imaging this month showed that despite chemo and radiation that she still having disease progression.  It has been discussed with her that there is no other treatment available with prognosis of around 6 months.  She is accepting of this but this also increases her anxiety.  Discussed hospice and she also discussed some travel she wants to do with her sister.  Will reach out to colleagues to see if she should go under hospice services now or wait til she is done with travels.  She can start hospice services and get services lined up while she is out of town.  Will reach back out to patient and see if she would like to start hospice now and get hospice referral with her approval.  2.  Appetite.  Patient states having decreased appetite since starting cancer treatments.  She has lost weight from 130 to 105.  She tries to eat each day though eats only a few bites.  She does supplement with Boost.  States that she likes  to drink milk.  Have encouraged that she can add ovaltine to her milk to add extra nutrients.  She states that she gets weaker when she does not eat and that encourages her to try to eat what she can.  States that her mood is better when she keeps up her strength.  Encouraged to try to eat more frequently to try to get more bites in throughout the day.  I spent 90 minutes providing this consultation,  from 3:30 to 5:00 including time spent with patient, chart review, provider coordination, documentation. More than 50% of the time in this consultation was spent coordinating communication.   HISTORY OF PRESENT ILLNESS:  Jennifer Santos is a 54 y.o. year old female with multiple medical problems including cervical cancer wit mets to liver, pancytopenia, right hydronephrosis. Palliative Care was asked to help address goals of care.   CODE STATUS: DNR  PPS: 50% HOSPICE ELIGIBILITY/DIAGNOSIS: TBD  PHYSICAL EXAM:   General: NAD, frail appearing, thin Extremities: no edema, no joint deformities Skin: no rashes Neurological: Weakness but otherwise nonfocal   PAST MEDICAL HISTORY:  Past Medical History:  Diagnosis Date  . Acquired pancytopenia ([place])    due to chemo  . Cervical cancer, FIGO stage IIIB [facility]) oncologist-- dr gorsuch/  dr Sondra Come   initial dx 11-03-2018,  Invasive Moderately differentated SCC-- completed chemo 01-13-2019 and IMRT 01-11-2019,  pt startes planned brachytherapy 01-21-2019  for 5 treatments  . Compression fracture of T12 vertebra ([facility])    per MRI in epic 11-26-2018  remote endplate   . Fatigue   . History of cancer chemotherapy 11-27-2018  to 01-13-2019   cervical ca  . History of external beam radiation therapy 11-30-2018  to 01-11-2019   cervical cancer/ pelvis---   . Hydronephrosis, right    w/ normal kidney function    SOCIAL HX:  Social History   Tobacco Use  . Smoking status: Current Every Day Smoker    Packs/day: 0.50    Types: Cigarettes  . Smokeless  tobacco: Never Used  Substance Use Topics  . Alcohol use: Yes    Comment: Social     ALLERGIES: No Known Allergies   PERTINENT MEDICATIONS:  Outpatient Encounter Medications as of 11/29/2019  Medication Sig  . acetaminophen (TYLENOL) 500 MG tablet Take 1,000 mg by mouth every 6 (six) hours as needed (pain).  Marland Kitchen amLODipine (NORVASC) 10 MG tablet Take 1 tablet (10 mg total) by mouth daily.  Marland Kitchen lidocaine-prilocaine (EMLA) cream Apply 1 application topically as needed.  . mirtazapine (REMERON) 15 MG tablet TAKE 1 TABLET BY MOUTH EVERYDAY AT BEDTIME  . morphine (MSIR) 15 MG tablet Take 1 tablet (15 mg total) by mouth every 4 (four) hours as needed for severe pain.  Marland Kitchen ondansetron (ZOFRAN) 8 MG tablet Take 8 mg by mouth every 8 (eight) hours as needed.  . prochlorperazine (COMPAZINE) 10 MG tablet Take 1 tablet (10 mg total) by mouth every 6 (six) hours as needed.  Marland Kitchen tiZANidine (ZANAFLEX) 2 MG tablet Take 2 mg by mouth at bedtime as needed.    No facility-administered encounter medications on file as of 11/29/2019.     Clevester Helzer Jenetta Downer, NP

## 2019-11-30 ENCOUNTER — Other Ambulatory Visit: Payer: Self-pay | Admitting: Hematology and Oncology

## 2019-11-30 MED ORDER — MORPHINE SULFATE 15 MG PO TABS: 15.0000 mg | ORAL_TABLET | ORAL | 0 refills | Status: AC | PRN

## 2019-11-30 MED FILL — MORPHINE SULFATE 15 MG TABS: 15 | 10 days supply | Qty: 60 | Fill #0

## 2019-11-30 NOTE — Telephone Encounter (Signed)
Called and given below message. She verbalized understanding. 

## 2019-11-30 NOTE — Telephone Encounter (Signed)
done

## 2019-12-01 ENCOUNTER — Telehealth: Payer: Self-pay

## 2019-12-01 ENCOUNTER — Telehealth: Payer: Self-pay | Admitting: Adult Health Nurse Practitioner

## 2019-12-01 NOTE — Telephone Encounter (Signed)
Called patient to further discuss hospice services.  Left message with reason for call and contact info for return call. Estie Sproule K. Olena Heckle NP  As late entry had called patient yesterday, 11/30/19, as well for same reason as above and left message with reason for call and call back info. Houa Nie K. Olena Heckle NP

## 2019-12-01 NOTE — Telephone Encounter (Signed)
At request of Amy NP, message sent to patient requesting a return call for an update.

## 2019-12-06 ENCOUNTER — Telehealth: Payer: Self-pay | Admitting: Adult Health Nurse Practitioner

## 2019-12-06 NOTE — Telephone Encounter (Signed)
Called patient to further discuss hospice services.  Left message with reason for call and contact info for return call. Jennifer Santos K. Olena Heckle NP

## 2019-12-09 ENCOUNTER — Telehealth: Payer: Self-pay | Admitting: Adult Health Nurse Practitioner

## 2019-12-09 NOTE — Telephone Encounter (Signed)
Called patient to further discuss hospice services.  Left VM with reason for call and contact info for return call.   Also reached out to son, Marvia Pickles, to see if her contact info had changed as she has not returned my calls. Left VM with reason for call and call back info for return call. Annalena Piatt K. Olena Heckle NP

## 2019-12-14 ENCOUNTER — Telehealth: Payer: Self-pay | Admitting: Adult Health Nurse Practitioner

## 2019-12-14 NOTE — Telephone Encounter (Signed)
At request of Amy NP, message sent to patient requesting a return call for an update

## 2019-12-15 ENCOUNTER — Telehealth: Payer: Self-pay | Admitting: Adult Health Nurse Practitioner

## 2019-12-15 NOTE — Telephone Encounter (Signed)
Patient returned my message.  Discussed pursuing hospice services prior to traveling with her sister.  She wants to pursue hospice now and have their support during her travels. Reached out to Dr. Alvy Bimler via email through Epic with request. Charli Halle K. Olena Heckle NP

## 2019-12-16 ENCOUNTER — Telehealth: Payer: Self-pay

## 2019-12-16 NOTE — Telephone Encounter (Signed)
Referral called to [facility], verbal order given. Patient currently in palliative care to intake person.

## 2020-04-05 DEATH — deceased

## 2021-01-01 IMAGING — CR LUMBAR SPINE - COMPLETE 4+ VIEW
1 series · 5 of 5 positions shown · non-contrast
Comparison: 09/01/2018 abdominal film.

CLINICAL DATA: 52-year-old female with right-sided lower back pain.
Hurts when sitting or standing in same position for long periods of
time. Pain for 8 months. No known injury. Initial encounter.

EXAM:
LUMBAR SPINE - COMPLETE 4+ VIEW

[Series 1: dg lumbar spine complete 4 +v · 0.14mm/px · 5 of 5 slices shown]
[im 1/5]
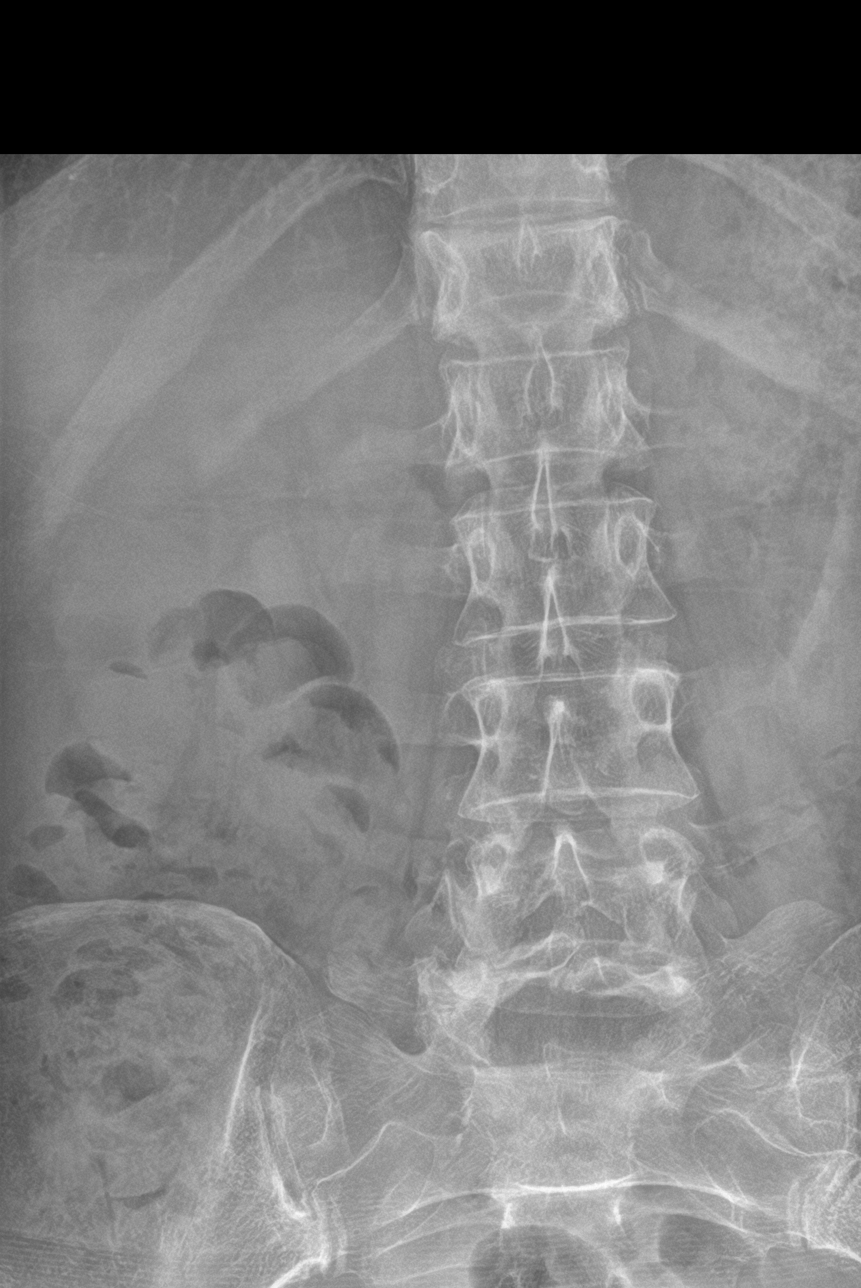
[im 2/5]
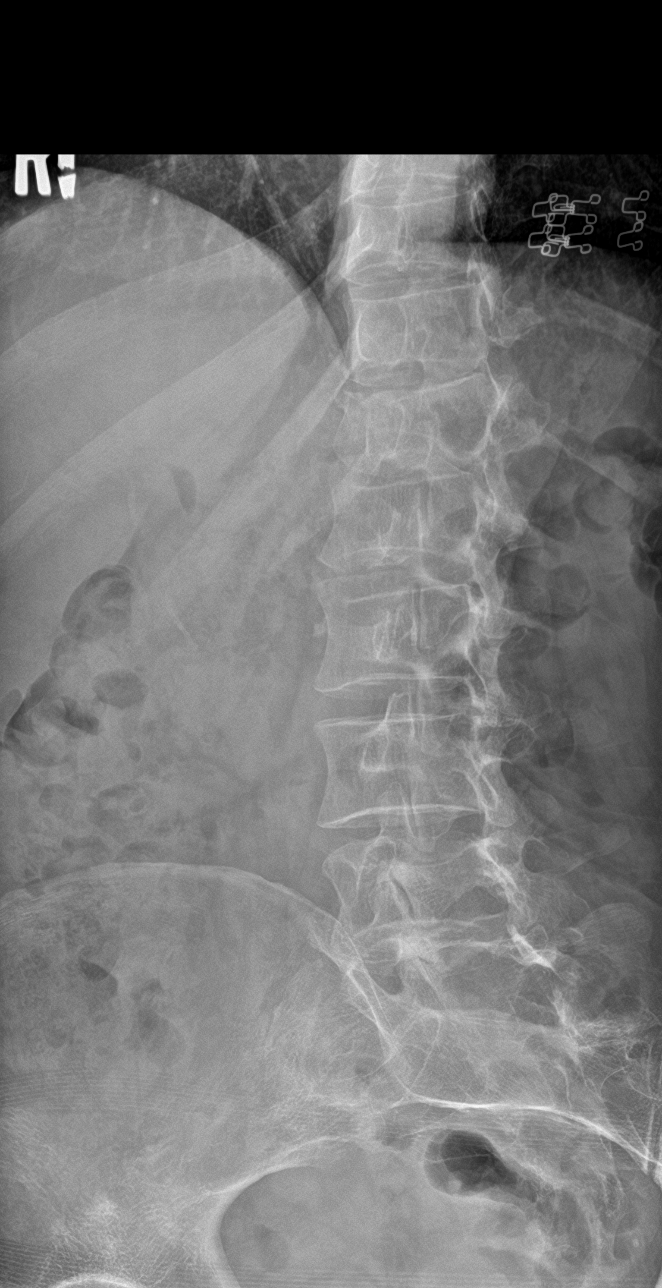
[im 3/5]
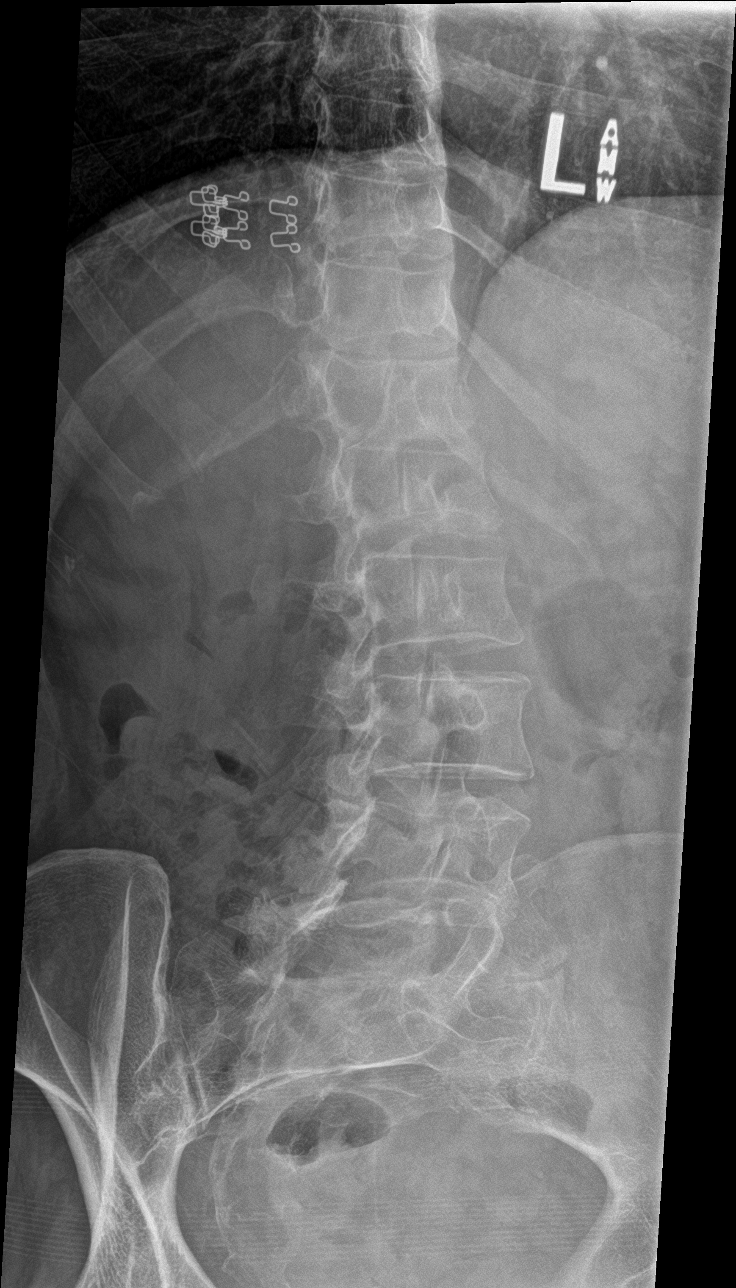
[im 4/5]
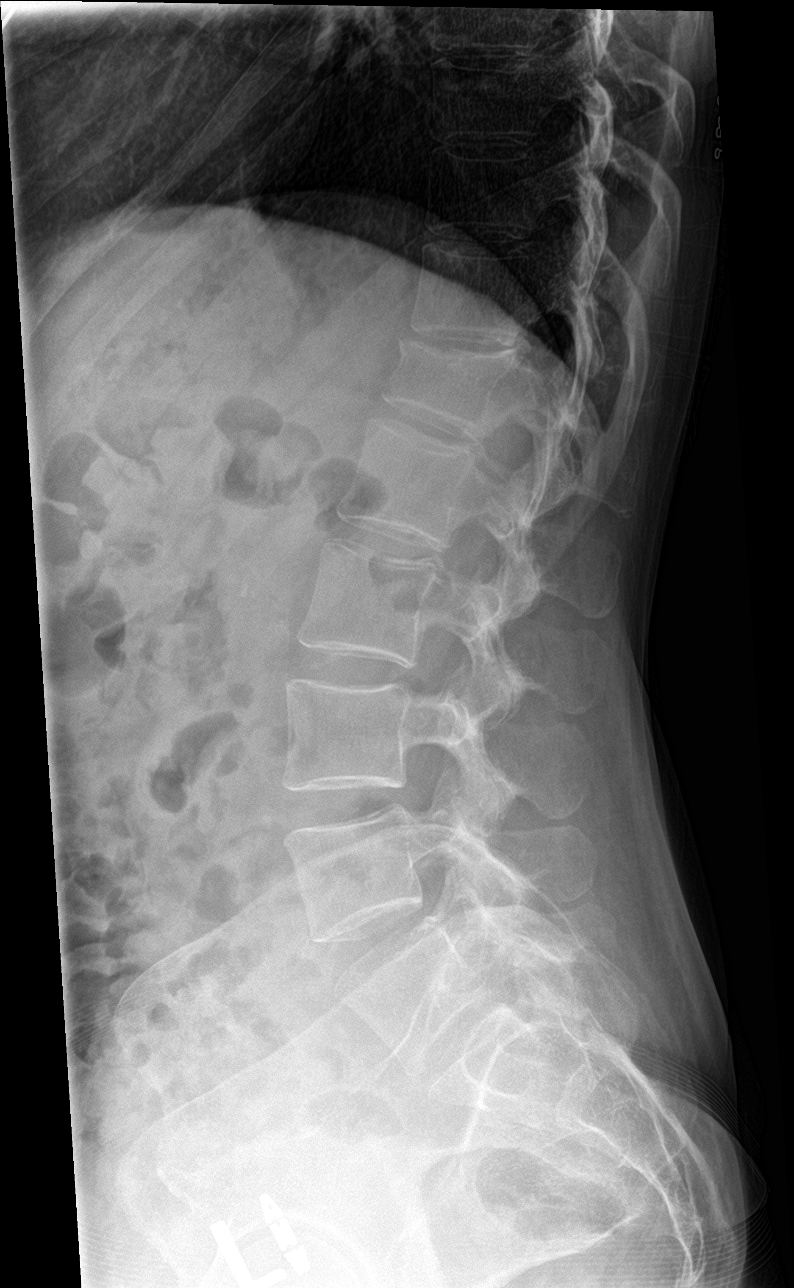
[im 5/5]
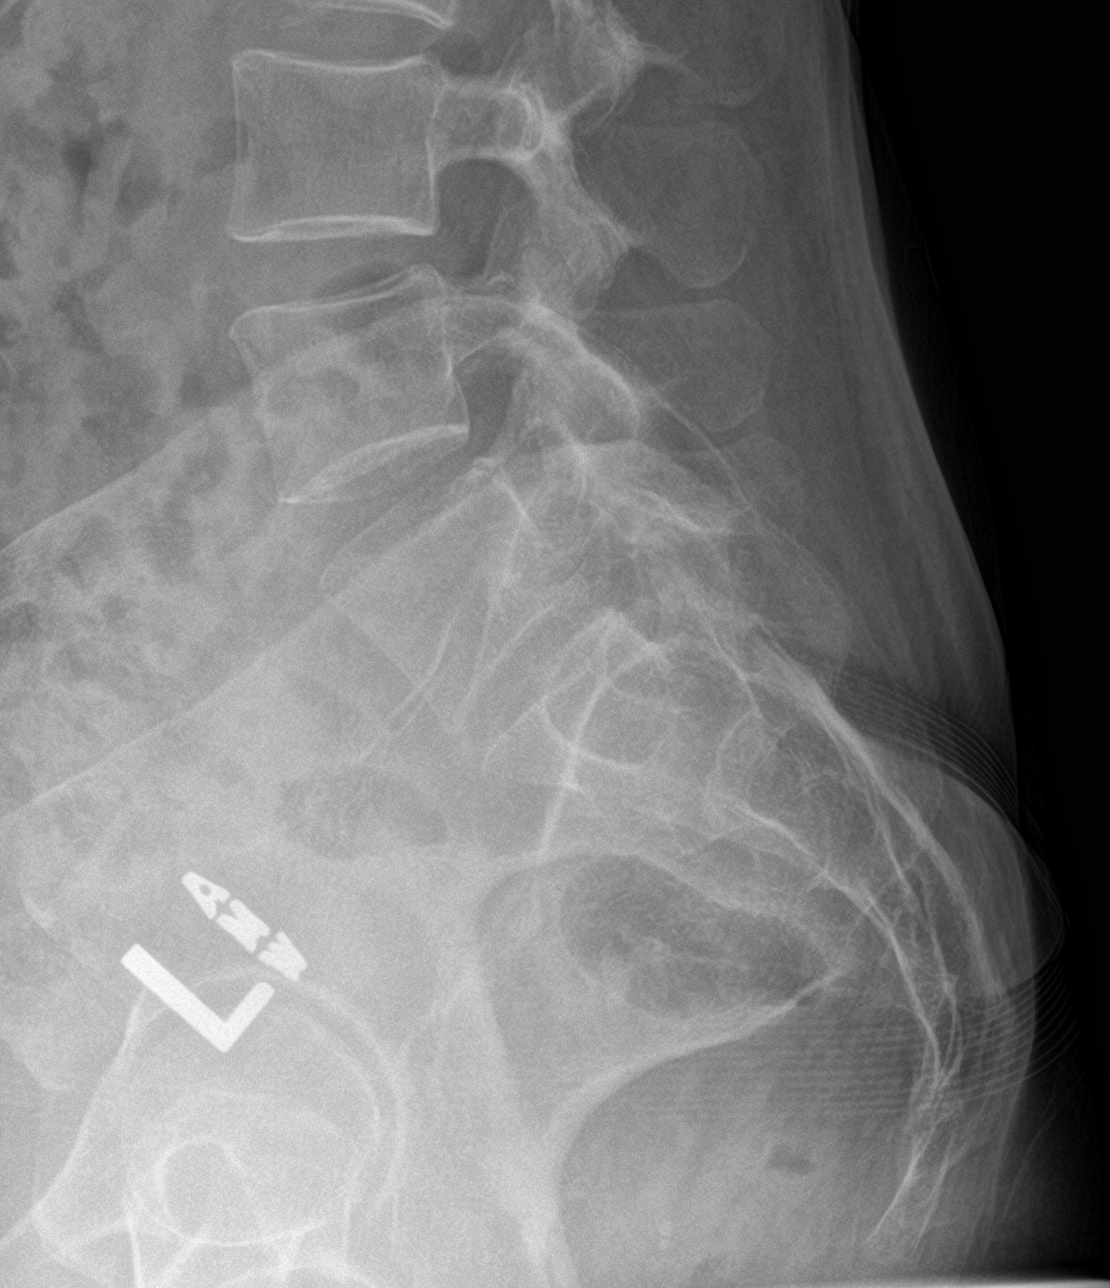

[5 of 5 positions shown; findings below may reference images not displayed]

FINDINGS: T12 superior endplate compression fracture of indeterminate age.
Subsequent anterior wedge compression deformity with 50% loss of
height anteriorly and 10% loss of height posteriorly. Slight
retropulsion posterosuperior aspect of the T12 vertebra.

Transitional L5 vertebral body. Spina bifida occulta L5 incidentally
noted.No lumbar compression fracture, pars defect or significant
disc space narrowing. Mild right-sided L4-5 and L5-S1 facet
degenerative changes.
IMPRESSION: 1. T12 superior endplate compression fracture of indeterminate age.
Subsequent anterior wedge compression deformity with 50% loss of
height anteriorly and 10% loss of height posteriorly. Slight
retropulsion posterosuperior aspect of the T12 vertebra.
2. Transitional L5 vertebral body. Spina bifida occulta L5
incidentally noted.
3. No lumbar compression fracture, pars defect or significant disc
space narrowing.
4. Mild right-sided L4-5 and L5-S1 facet degenerative changes.

These results will be called to the ordering clinician or
representative by the Radiologist Assistant, and communication
documented in the PACS or zVision Dashboard.

## 2021-02-07 IMAGING — MR MRI THORACIC SPINE WITHOUT AND WITH CONTRAST
4 of 10 series · 19 of 48 positions shown · IV contrast (gadavist)
Comparison: And PET CT scan 11/20/2018. CT chest, abdomen and
pelvis 11/17/2018.

CLINICAL DATA: Patient with a history of metastatic cervical
carcinoma. Focal uptake seen in the central spinal canal at the 11
of T11-12. Question metastatic disease.

EXAM:
MRI THORACIC WITHOUT AND WITH CONTRAST
TECHNIQUE: Multiplanar and multiecho pulse sequences of the thoracic spine were
obtained without and with intravenous contrast.
CONTRAST:  5 cc Gadavist IV.

[Series 5: T1 · sagittal · 3.0mm · 0.62mm/px · 2 of 14 slices shown (1 of 2)]
[im 1/14]
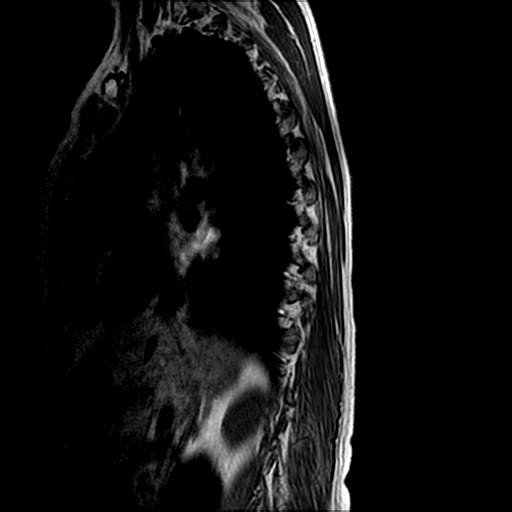
[im 14/14]
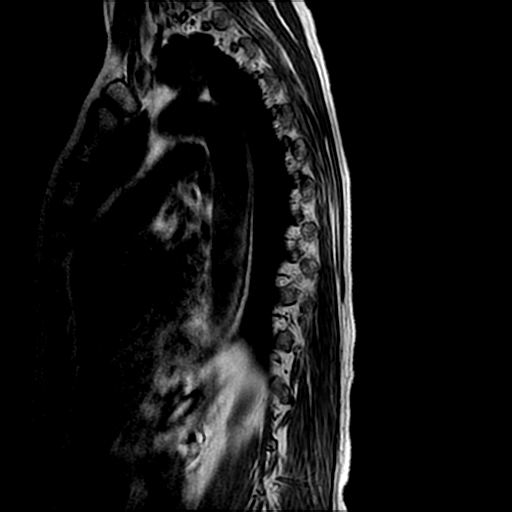

[Series 7: T2 · axial · 4.0mm · 0.41mm/px · z∈[-259,-34]mm · 8 of 46 slices shown]
[im 1/46]
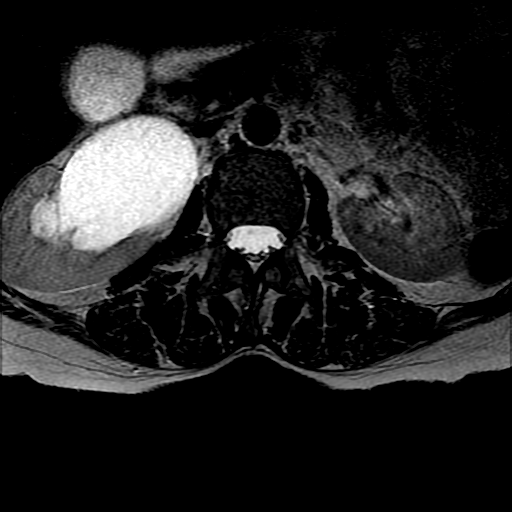
[im 7/46]
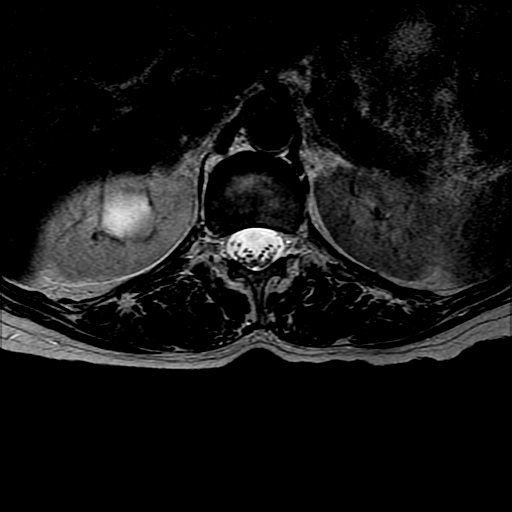
[im 13/46]
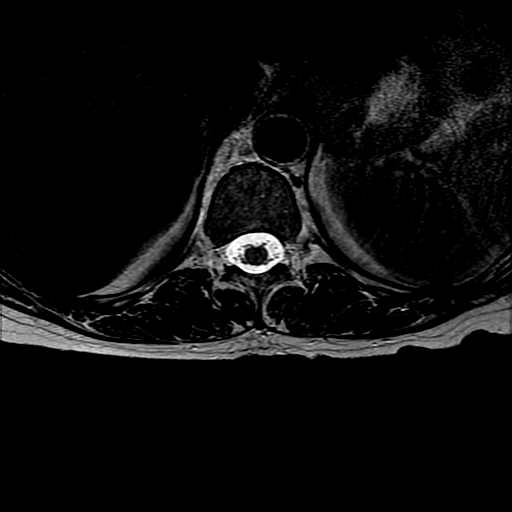
[im 20/46]
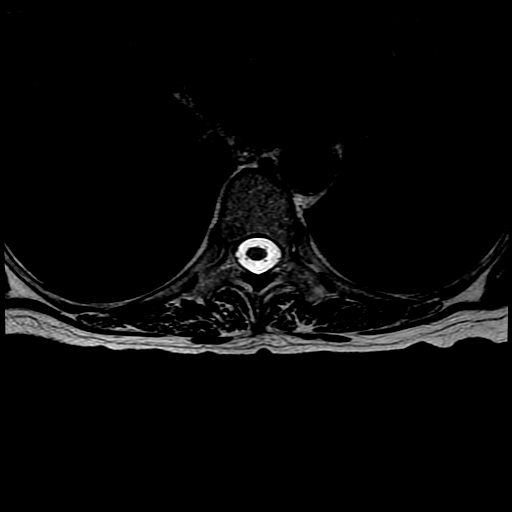
[im 26/46]
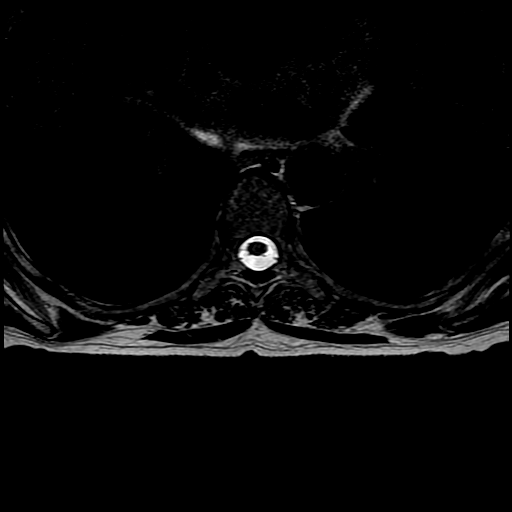
[im 33/46]
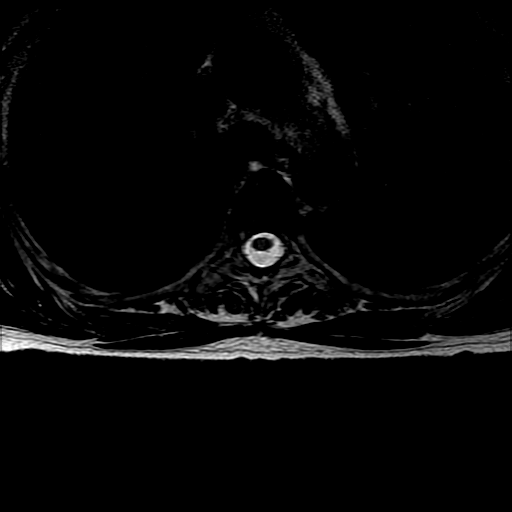
[im 39/46]
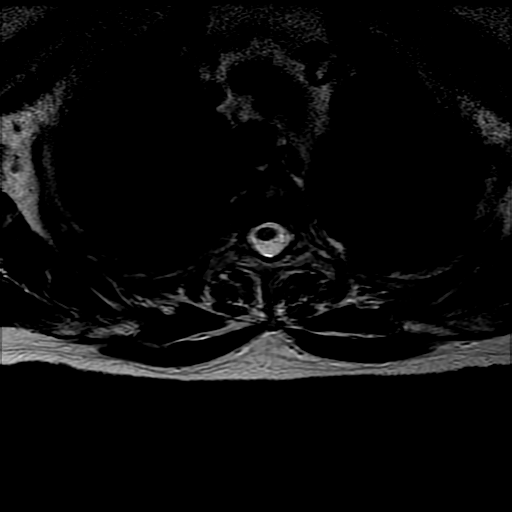
[im 46/46]
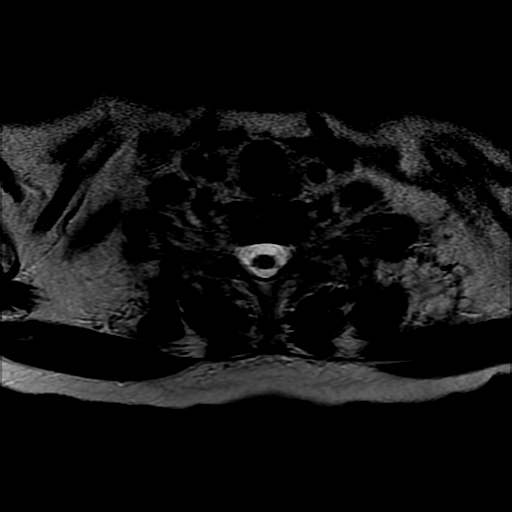

[Series 9: T1 · axial · non-contrast · 4.0mm · 0.41mm/px · z∈[-259,-62]mm · 6 of 46 slices shown (2 of 2)]
[im 1/46]
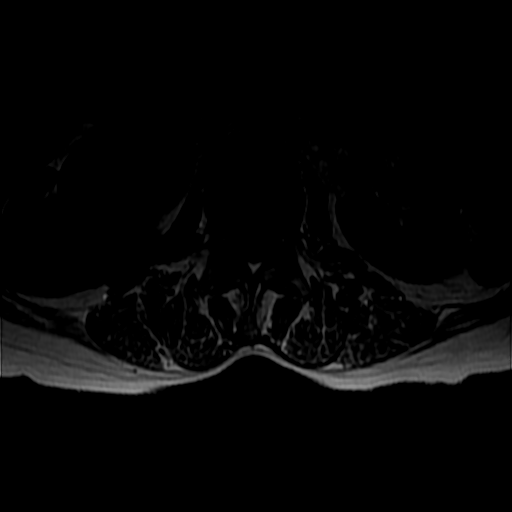
[im 7/46]
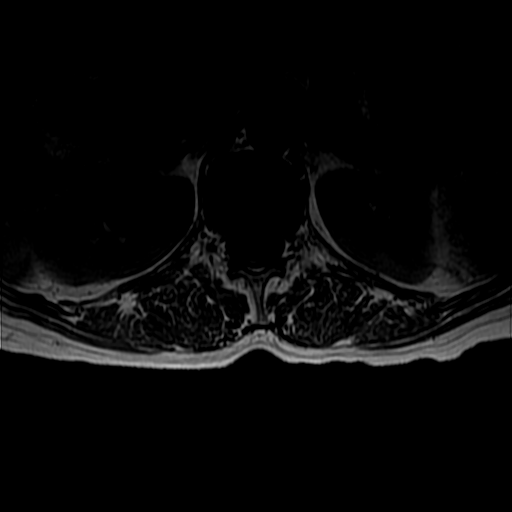
[im 13/46]
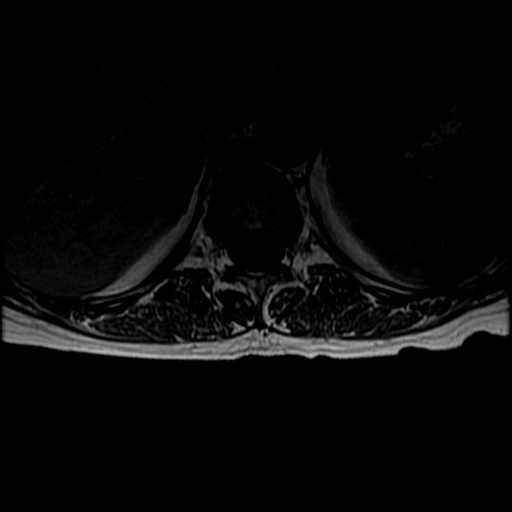
[im 20/46]
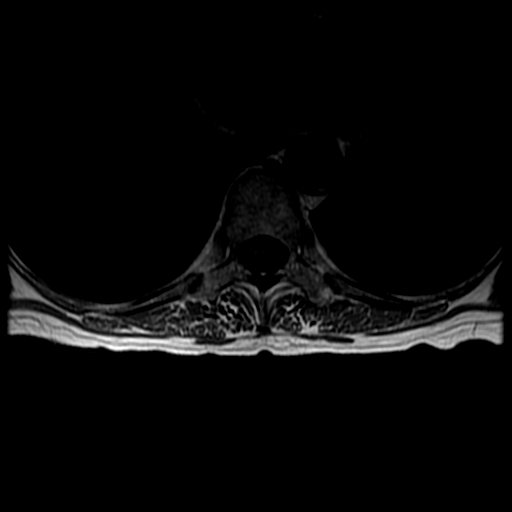
[im 26/46]
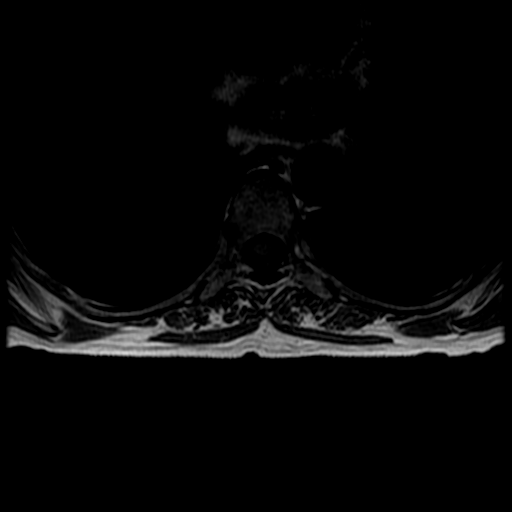
[im 39/46]
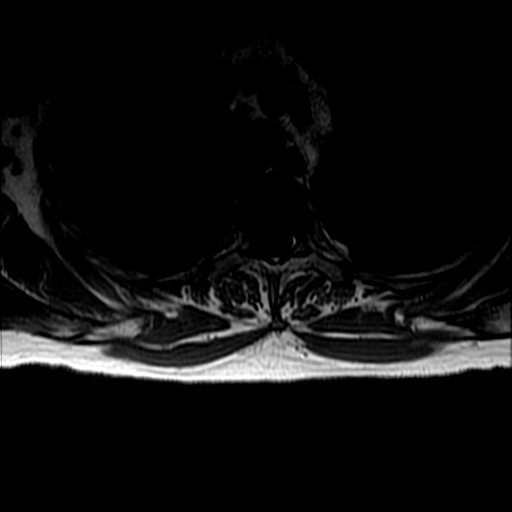

[Series 10: T2 post-contrast · sagittal · 3.0mm · 0.62mm/px · 3 of 14 slices shown]
[im 1/14]
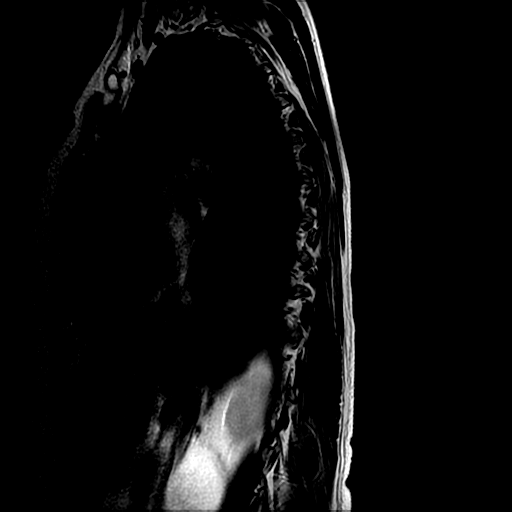
[im 7/14]
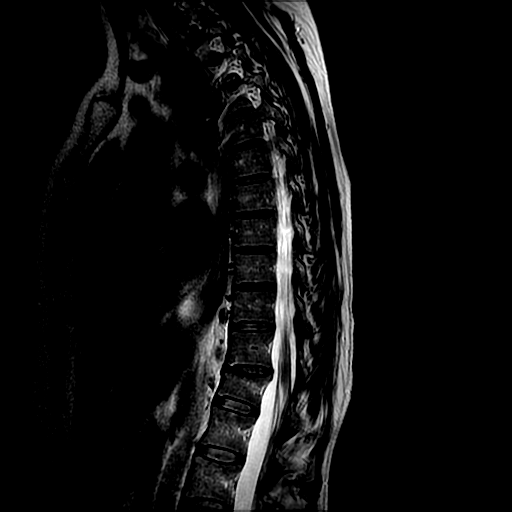
[im 14/14]
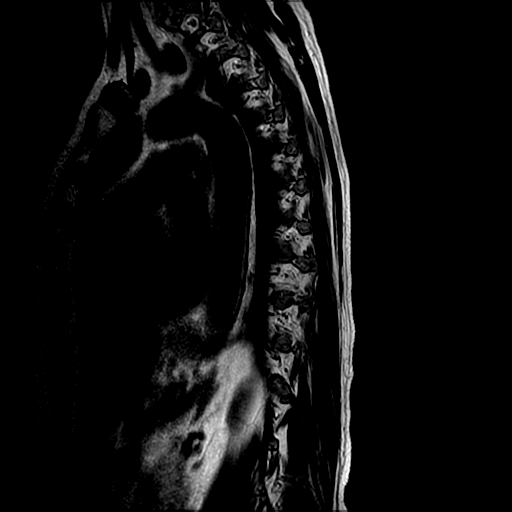

[19 of 48 positions shown; findings below may reference images not displayed]

FINDINGS: MRI THORACIC SPINE FINDINGS

Alignment:  Normal.

Vertebrae: Mild, remote superior endplate compression fracture of
T12 is identified as seen on the prior examinations. Vertebral body
height loss is up to approximately 15% anteriorly. Bone marrow
signal is normal throughout without evidence of metastatic disease.

Cord: Normal size, shape and signal throughout. The conus medullaris
is normal in signal and position at the T12-L1 level. No enhancement
of the cord is identified. No lesion within the central spinal canal
is present.

Paraspinal and other soft tissues: See report of prior cross
sectional imaging studies. Right hydronephrosis noted.

Disc levels:

The central spinal canal and neural foramina are widely patent
throughout. Minimal retropulsion off the superior endplate of T12 is
noted.
IMPRESSION: Negative for metastatic disease. No abnormality to correlate with
potential lesion within the spinal canal at T11-12 as seen on prior
CT is identified.

Remote, mild superior endplate compression fracture of T12.

Widely patent central canal and foramina throughout.

Right hydronephrosis as seen on prior studies.
